# Patient Record
Sex: Male | Born: 1945 | Race: White | Hispanic: No | Marital: Married | Smoking: Former smoker
Health system: Southern US, Community
[De-identification: ages and names within clinical notes are randomized; demographics above are authoritative.]

## PROBLEM LIST (undated history)

## (undated) DIAGNOSIS — E039 Hypothyroidism, unspecified: Secondary | ICD-10-CM

## (undated) DIAGNOSIS — F319 Bipolar disorder, unspecified: Secondary | ICD-10-CM

## (undated) DIAGNOSIS — I639 Cerebral infarction, unspecified: Secondary | ICD-10-CM

## (undated) DIAGNOSIS — I10 Essential (primary) hypertension: Secondary | ICD-10-CM

## (undated) DIAGNOSIS — A159 Respiratory tuberculosis unspecified: Secondary | ICD-10-CM

## (undated) DIAGNOSIS — IMO0001 Reserved for inherently not codable concepts without codable children: Secondary | ICD-10-CM

## (undated) DIAGNOSIS — C801 Malignant (primary) neoplasm, unspecified: Secondary | ICD-10-CM

## (undated) DIAGNOSIS — F0150 Vascular dementia without behavioral disturbance: Secondary | ICD-10-CM

## (undated) DIAGNOSIS — E119 Type 2 diabetes mellitus without complications: Secondary | ICD-10-CM

## (undated) DIAGNOSIS — I519 Heart disease, unspecified: Secondary | ICD-10-CM

## (undated) DIAGNOSIS — I2510 Atherosclerotic heart disease of native coronary artery without angina pectoris: Secondary | ICD-10-CM

## (undated) HISTORY — PX: CORONARY ARTERY BYPASS GRAFT: SHX141

## (undated) HISTORY — DX: Type 2 diabetes mellitus without complications: E11.9

## (undated) HISTORY — DX: Atherosclerotic heart disease of native coronary artery without angina pectoris: I25.10

## (undated) HISTORY — DX: Bipolar disorder, unspecified: F31.9

## (undated) HISTORY — PX: APPENDECTOMY: SHX54

## (undated) HISTORY — DX: Vascular dementia, unspecified severity, without behavioral disturbance, psychotic disturbance, mood disturbance, and anxiety: F01.50

## (undated) HISTORY — PX: OTHER SURGICAL HISTORY: SHX169

## (undated) HISTORY — DX: Cerebral infarction, unspecified: I63.9

## (undated) HISTORY — DX: Heart disease, unspecified: I51.9

---

## 1996-09-16 DIAGNOSIS — I639 Cerebral infarction, unspecified: Secondary | ICD-10-CM

## 1996-09-16 HISTORY — DX: Cerebral infarction, unspecified: I63.9

## 2004-12-24 ENCOUNTER — Emergency Department: Payer: Self-pay | Admitting: Emergency Medicine

## 2005-01-01 ENCOUNTER — Emergency Department: Payer: Self-pay | Admitting: Emergency Medicine

## 2005-04-16 ENCOUNTER — Emergency Department: Payer: Self-pay | Admitting: Emergency Medicine

## 2005-06-19 ENCOUNTER — Emergency Department: Payer: Self-pay | Admitting: Emergency Medicine

## 2005-12-11 ENCOUNTER — Emergency Department: Payer: Self-pay | Admitting: Internal Medicine

## 2006-01-01 ENCOUNTER — Inpatient Hospital Stay: Payer: Self-pay | Admitting: Psychiatry

## 2006-01-06 ENCOUNTER — Other Ambulatory Visit: Payer: Self-pay

## 2006-05-10 ENCOUNTER — Emergency Department: Payer: Self-pay | Admitting: Emergency Medicine

## 2008-08-16 ENCOUNTER — Ambulatory Visit: Payer: Self-pay | Admitting: Oncology

## 2008-08-30 ENCOUNTER — Inpatient Hospital Stay: Payer: Self-pay | Admitting: Internal Medicine

## 2008-09-16 ENCOUNTER — Ambulatory Visit: Payer: Self-pay | Admitting: Oncology

## 2008-11-08 ENCOUNTER — Inpatient Hospital Stay: Payer: Self-pay | Admitting: Unknown Physician Specialty

## 2008-11-08 ENCOUNTER — Inpatient Hospital Stay: Payer: Self-pay | Admitting: Internal Medicine

## 2009-09-07 ENCOUNTER — Inpatient Hospital Stay: Payer: Self-pay | Admitting: Internal Medicine

## 2009-09-14 ENCOUNTER — Encounter: Payer: Self-pay | Admitting: Internal Medicine

## 2009-09-16 ENCOUNTER — Encounter: Payer: Self-pay | Admitting: Internal Medicine

## 2010-08-13 ENCOUNTER — Inpatient Hospital Stay: Payer: Self-pay | Admitting: Internal Medicine

## 2010-08-28 ENCOUNTER — Ambulatory Visit: Payer: Self-pay | Admitting: Ophthalmology

## 2010-10-30 ENCOUNTER — Ambulatory Visit: Payer: Self-pay | Admitting: Ophthalmology

## 2012-07-13 ENCOUNTER — Ambulatory Visit: Payer: Self-pay | Admitting: Family Medicine

## 2012-07-27 ENCOUNTER — Inpatient Hospital Stay: Payer: Self-pay | Admitting: Internal Medicine

## 2012-07-27 LAB — CBC WITH DIFFERENTIAL/PLATELET
Basophil %: 0.3 %
Eosinophil #: 0 10*3/uL (ref 0.0–0.7)
Eosinophil %: 0.1 %
HCT: 41.1 % (ref 40.0–52.0)
HGB: 14 g/dL (ref 13.0–18.0)
Lymphocyte #: 0.9 10*3/uL — ABNORMAL LOW (ref 1.0–3.6)
MCHC: 34.1 g/dL (ref 32.0–36.0)
MCV: 103 fL — ABNORMAL HIGH (ref 80–100)
Monocyte %: 13.5 %
Neutrophil #: 5.8 10*3/uL (ref 1.4–6.5)

## 2012-07-27 LAB — URINALYSIS, COMPLETE
Bilirubin,UR: NEGATIVE
Glucose,UR: NEGATIVE mg/dL (ref 0–75)
Ketone: NEGATIVE
Protein: NEGATIVE
RBC,UR: 3 /HPF (ref 0–5)
Specific Gravity: 1.004 (ref 1.003–1.030)
Squamous Epithelial: <1

## 2012-07-27 LAB — COMPREHENSIVE METABOLIC PANEL
Albumin: 2.9 g/dL — ABNORMAL LOW (ref 3.4–5.0)
Alkaline Phosphatase: 75 U/L (ref 50–136)
Anion Gap: 11 (ref 7–16)
BUN: 21 mg/dL — ABNORMAL HIGH (ref 7–18)
Calcium, Total: 8.5 mg/dL (ref 8.5–10.1)
Chloride: 110 mmol/L — ABNORMAL HIGH (ref 98–107)
Co2: 20 mmol/L — ABNORMAL LOW (ref 21–32)
Glucose: 76 mg/dL (ref 65–99)
Osmolality: 283 (ref 275–301)
Potassium: 4.3 mmol/L (ref 3.5–5.1)
SGOT(AST): 43 U/L — ABNORMAL HIGH (ref 15–37)
Sodium: 141 mmol/L (ref 136–145)
Total Protein: 7 g/dL (ref 6.4–8.2)

## 2012-07-27 LAB — VALPROIC ACID LEVEL: Valproic Acid: 61 ug/mL

## 2012-07-27 LAB — CK: CK, Total: 378 U/L — ABNORMAL HIGH (ref 35–232)

## 2012-07-27 LAB — TSH: Thyroid Stimulating Horm: 1.2 u[IU]/mL

## 2012-07-28 LAB — CK: CK, Total: 1065 U/L — ABNORMAL HIGH (ref 35–232)

## 2012-07-28 LAB — CK TOTAL AND CKMB (NOT AT ARMC)
CK, Total: 971 U/L — ABNORMAL HIGH (ref 35–232)
CK-MB: 10.7 ng/mL — ABNORMAL HIGH (ref 0.5–3.6)
CK-MB: 11.1 ng/mL — ABNORMAL HIGH (ref 0.5–3.6)

## 2012-07-28 LAB — CBC WITH DIFFERENTIAL/PLATELET
Basophil #: 0 10*3/uL (ref 0.0–0.1)
Basophil %: 0.3 %
Eosinophil #: 0 10*3/uL (ref 0.0–0.7)
HCT: 38.5 % — ABNORMAL LOW (ref 40.0–52.0)
HGB: 12.7 g/dL — ABNORMAL LOW (ref 13.0–18.0)
MCH: 33.9 pg (ref 26.0–34.0)
MCHC: 33 g/dL (ref 32.0–36.0)
MCV: 103 fL — ABNORMAL HIGH (ref 80–100)
Monocyte #: 1.1 x10 3/mm — ABNORMAL HIGH (ref 0.2–1.0)
Neutrophil %: 76.1 %
RDW: 13.5 % (ref 11.5–14.5)

## 2012-07-28 LAB — BASIC METABOLIC PANEL
Anion Gap: 9 (ref 7–16)
BUN: 17 mg/dL (ref 7–18)
Calcium, Total: 8.3 mg/dL — ABNORMAL LOW (ref 8.5–10.1)
Co2: 22 mmol/L (ref 21–32)
Creatinine: 1.4 mg/dL — ABNORMAL HIGH (ref 0.60–1.30)
EGFR (African American): >60
EGFR (Non-African Amer.): 52 — ABNORMAL LOW
Glucose: 84 mg/dL (ref 65–99)
Potassium: 4 mmol/L (ref 3.5–5.1)

## 2012-07-28 LAB — HEMOGLOBIN A1C: Hemoglobin A1C: 5.7 % (ref 4.2–6.3)

## 2012-07-28 LAB — LIPID PANEL
Cholesterol: 75 mg/dL (ref 0–200)
Ldl Cholesterol, Calc: 24 mg/dL (ref 0–100)
Triglycerides: 81 mg/dL (ref 0–200)
VLDL Cholesterol, Calc: 16 mg/dL (ref 5–40)

## 2012-07-28 LAB — MAGNESIUM: Magnesium: 1.4 mg/dL — ABNORMAL LOW

## 2012-07-28 LAB — TROPONIN I: Troponin-I: 0.25 ng/mL — ABNORMAL HIGH

## 2012-07-29 LAB — BASIC METABOLIC PANEL
Anion Gap: 8 (ref 7–16)
Co2: 21 mmol/L (ref 21–32)
Creatinine: 1.1 mg/dL (ref 0.60–1.30)
EGFR (Non-African Amer.): >60
Glucose: 82 mg/dL (ref 65–99)
Potassium: 3.6 mmol/L (ref 3.5–5.1)
Sodium: 148 mmol/L — ABNORMAL HIGH (ref 136–145)

## 2012-07-29 LAB — CBC WITH DIFFERENTIAL/PLATELET
Basophil #: 0 10*3/uL (ref 0.0–0.1)
Basophil %: 0.2 %
Eosinophil #: 0 10*3/uL (ref 0.0–0.7)
Eosinophil %: 0.1 %
HCT: 37.5 % — ABNORMAL LOW (ref 40.0–52.0)
Lymphocyte #: 1.4 10*3/uL (ref 1.0–3.6)
Lymphocyte %: 14.4 %
MCH: 35.5 pg — ABNORMAL HIGH (ref 26.0–34.0)
MCHC: 34.8 g/dL (ref 32.0–36.0)
MCV: 102 fL — ABNORMAL HIGH (ref 80–100)
Monocyte %: 8.1 %
Platelet: 111 10*3/uL — ABNORMAL LOW (ref 150–440)
RBC: 3.68 10*6/uL — ABNORMAL LOW (ref 4.40–5.90)
RDW: 14 % (ref 11.5–14.5)
WBC: 9.5 10*3/uL (ref 3.8–10.6)

## 2012-07-31 LAB — BASIC METABOLIC PANEL
BUN: 11 mg/dL (ref 7–18)
Chloride: 121 mmol/L — ABNORMAL HIGH (ref 98–107)
Co2: 22 mmol/L (ref 21–32)
Creatinine: 1.08 mg/dL (ref 0.60–1.30)
EGFR (African American): >60
EGFR (Non-African Amer.): >60
Osmolality: 297 (ref 275–301)

## 2012-08-02 LAB — CULTURE, BLOOD (SINGLE)

## 2012-08-02 LAB — BASIC METABOLIC PANEL
Calcium, Total: 8.8 mg/dL (ref 8.5–10.1)
Co2: 24 mmol/L (ref 21–32)
EGFR (African American): >60
EGFR (Non-African Amer.): >60
Osmolality: 287 (ref 275–301)
Potassium: 3.9 mmol/L (ref 3.5–5.1)
Sodium: 143 mmol/L (ref 136–145)

## 2012-08-02 LAB — PLATELET COUNT: Platelet: 162 10*3/uL (ref 150–440)

## 2012-08-02 LAB — EXPECTORATED SPUTUM ASSESSMENT W GRAM STAIN, RFLX TO RESP C

## 2013-04-28 ENCOUNTER — Ambulatory Visit: Payer: Self-pay | Admitting: Family

## 2013-09-16 DIAGNOSIS — A159 Respiratory tuberculosis unspecified: Secondary | ICD-10-CM

## 2013-09-16 HISTORY — DX: Respiratory tuberculosis unspecified: A15.9

## 2014-12-30 ENCOUNTER — Other Ambulatory Visit: Payer: Self-pay | Admitting: Family

## 2014-12-30 DIAGNOSIS — R911 Solitary pulmonary nodule: Secondary | ICD-10-CM

## 2015-01-03 NOTE — Consult Note (Signed)
PATIENT NAME:  Marcus [name], Marcus [name] MR#:  [id number] DATE OF BIRTH:  Dec 02, 1945  DATE OF CONSULTATION:  07/28/2012  REFERRING PHYSICIAN:  Dr. Bridgette Habermann CONSULTING PHYSICIAN:  Isaias Cowman, MD  PRIMARY CARE PHYSICIAN:  Dr. Derrek Monaco.  CHIEF COMPLAINT: I have bronchitis.   REASON FOR CONSULTATION: Consultation requested for evaluation of elevated troponin.   HISTORY OF PRESENT ILLNESS: The patient is a 69 year old gentleman with known history of coronary artery disease status post bypass graft surgery who was admitted with primary diagnosis of bronchitis. The patient has had a recent history of productive cough with greenish sputum for approximately two weeks with associated generalized weakness, fever and chills. The patient was admitted to telemetry where he had borderline elevated troponin of 0.25 without peak or trough, without associated chest pain. The patient has borderline elevated BUN and creatinine at 21 and 1.4, respectively. EKG shows sinus rhythm without acute ischemic ST-T wave changes.   PAST MEDICAL HISTORY:  1. Status post coronary artery bypass graft surgery x5 at [facility] in 1998.  2. Hypertension.  3. Hyperlipidemia.  4. Type 2 diabetes.  5. Prior history of cerebrovascular accident.  6. Bipolar disorder.  7. Tardive dyskinesia and chronic tremor.  8. History of cerebral aneurysm repair.   MEDICATIONS:  1. Aspirin 81 mg daily.  2. Atorvastatin 40 mg daily.  3. Metoprolol 50 mg daily.  4. Depakote 500 mg every morning, 250 mg at bedtime.  5. Levothyroxine 75 mcg daily.  6. Mucinex DM 1 tab q.12 p.r.n.  7. Olanzapine 30 mg at bedtime.   SOCIAL HISTORY: The patient currently lives with his wife and son. He smokes 1/2 pack of cigarettes a day.   FAMILY HISTORY: No immediate family history of coronary artery disease or myocardial infarction.   REVIEW OF SYSTEMS: CONSTITUTIONAL: The patient has had some fever and chills. EYES: No blurry  vision. EARS: No hearing loss. RESPIRATORY: The patient has a productive cough. CARDIOVASCULAR: No chest pain. GASTROINTESTINAL: The patient has some diarrhea. Denies nausea, vomiting, abdominal pain or constipation. GU: No dysuria or hematuria. ENDOCRINE: No polyuria or polydipsia. HEMATOLOGICAL: No easy bruising or bleeding. MUSCULOSKELETAL: No arthralgias or myalgias. NEUROLOGICAL: No focal muscle weakness or numbness. The patient has had a prior cerebrovascular accident, which was manifested with inability to ambulate. PSYCH: The patient has bipolar disorder.   PHYSICAL EXAMINATION:  VITAL SIGNS: Blood pressure 95/61, pulse 101, respirations 19, temperature 99.3, pulse oximetry 93%.   HEENT: Pupils equal and reactive to light and accommodation.   NECK: Supple without thyromegaly.   LUNGS: Decreased breath sounds in both bases with scattered expiratory wheezes.   CARDIOVASCULAR: Normal jugular venous pressure. Normal PMI. Tachycardia. Normal S1, S2. No appreciable gallop, murmur, or rub.   ABDOMEN: Soft, nontender.   EXTREMITIES: There was no cyanosis, clubbing or edema. Pulses were intact bilaterally.   MUSCULOSKELETAL: Normal muscle tone.   NEUROLOGIC: The patient is alert and oriented x3. Motor and sensory are both grossly intact.   IMPRESSION: This is a 69 year old gentleman with known coronary artery disease who presents with bronchitis with borderline elevated troponin in the absence of chest pain or ECG changes. This likely represents demand supply ischemia and not due to acute coronary syndrome.   RECOMMENDATIONS:  1. Agree with overall current therapy.  2. Defer full dose anticoagulation.  3. Treat underlying problem.  4. Consider 2-D echocardiogram to evaluate left ventricular function.  5. Further recommendations pending echocardiogram results.    ____________________________ Isaias Cowman, MD ap:ap  D: 07/28/2012 09:16:22 ET T: 07/28/2012 11:16:28  ET JOB#: [id number]  cc: Isaias Cowman, MD, <Dictator> Isaias Cowman MD ELECTRONICALLY SIGNED 08/01/2012 10:04

## 2015-01-03 NOTE — Discharge Summary (Signed)
PATIENT NAME:  Marcus [name], Marcus [name] MR#:  [id number] DATE OF BIRTH:  17-Sep-1945  DATE OF ADMISSION:  07/27/2012 DATE OF DISCHARGE:  08/04/2012  DISCHARGE DIAGNOSES:  1. Systemic antiinflammatory response syndrome likely due to pneumonia, improved.  2. Pneumonia, improved with antibiotics.  3. Elevated troponin, likely due to demand ischemia, no myocardial infarction, Echo showing normal ejection fraction.  4. Low magnesium, replaced and resolved. 5. Anemia, likely dilutional. 6. Hypernatremia, resolved with hydration. 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 Three negative AFBs ruling out acute tuberculosis.   SECONDARY DIAGNOSES: 1. Hypertension.  2. Hyperlipidemia. 3. Coronary artery disease, status post coronary artery bypass graft. 4. Type 2 diabetes. 5. History of cerebral aneurysm.  6. Bipolar disorder.  7. Tardive dyskinesia and chronic tremor.   CONSULTANTS:  1. Isaias Cowman, MD - Cardiology. 2. Lanae Boast, MD - Infectious Disease. 3. Physical Therapy.   RESULTS: CT scan of the head without contrast on 07/27/2002 showed no acute intracranial process. Chronic small vessel ischemic disease.   CT scan of the chest without contrast on 07/28/2012 showed patchy infiltrate at the left lower lobe and 1.3 cm left upper lobe nodule.   Chest x-ray on 07/27/2002 showed no acute cardiopulmonary disease.   Chest x-ray on 07/28/2012 showed findings consistent with chronic obstructive pulmonary disease and likely superimposed acute bronchitis. No focal pneumonia.  2-D echocardiogram on 07/28/2012 showed normal LV function, LVH, mild mitral and tricuspid regurgitation.   Urinalysis on admission: Trace bacteria, 36 WBCs, and 1+ leukocyte esterase.   Blood cultures x2 were negative on admission.   Urine culture was contaminated.   Acid-fast culture x3 of sputum were negative.   HIV antibodies were nonreactive, on  07/28/2012. QuantiFERON gold test was negative.   Sputum culture was within normal limits, on 07/29/2012.  Sputum culture on 07/31/2012 was also within normal limits.   HISTORY AND SHORT HOSPITAL COURSE: The patient is a 69 year old male with the above-mentioned medical problems who was admitted for SIRS thought to be secondary to bronchitis. Please see Dr. Lauris Chroman dictated history and physical for further details. Cardiology consultation was obtained with Dr. Saralyn Pilar considering borderline elevated troponin thought to be secondary to supply/demand ischemia. He recommended 2-D echocardiogram, which was performed with results dictated above, essentially within normal limits. Infectious disease consultation was obtained with Dr. Clayborn Bigness as there was a positive PPD with CT scan worrisome for possible tuberculosis. He recommended isolation, would continue Levaquin for treatment of possible lung infection, and three AFBs of sputum, which were all negative. He also had QuantiFERON gold test, which was negative. The patient was feeling much better with physical therapy involved in his inpatient therapy and was discharged home on 08/04/2012 in stable condition.   DISCHARGE PHYSICAL EXAMINATION:   VITAL SIGNS: Temperature was 98.5, heart rate 63 per minute, respirations 20 per minute, blood pressure 104/66 mmHg, and he was saturating 91% on room air.   CARDIOVASCULAR: S1, S2 normal. No murmurs, rubs or gallops.   LUNGS: Clear to auscultation bilaterally. No wheezing, rales, rhonchi, or crepitation.   ABDOMEN: Soft, benign.   NEUROLOGIC: Nonfocal examination. All other physical examination remained at baseline.   DISCHARGE MEDICATIONS:  1. Aspirin 81 mg p.o. daily.  2. Depakote 250 mg p.o. at bedtime.  3. Toprol-XL 50 mg p.o. daily.  4. Levothyroxine 75 mcg p.o. daily.  5. Vitamin D 1.25 mg p.o. once a month. 6. Olanzapine 15 mg p.o. two tablets at bedtime.  7. Atorvastatin 40 mg p.o. at  bedtime.  8. Mucinex DM one tablet p.o. twice a day as needed.  9. Depakote 250 mg two tablets p.o. daily.   DISCHARGE DIET: Low fat, low cholesterol.   DISCHARGE ACTIVITY: As tolerated.                   DISCHARGE INSTRUCTIONS AND FOLLOW-UP: The patient was instructed to follow-up with his primary care physician at the [facility] with Dr. Patsey Berthold. He will need follow-up with the health department here in [facility] in 1 to 2 weeks.   TOTAL TIME DISCHARGING THIS PATIENT: 55 minutes. ____________________________ Lucina Mellow. Manuella Ghazi, MD vss:slb D: 08/08/2012 13:23:45 ET T: 08/09/2012 14:29:58 ET JOB#: [id number]  cc: Calab Sachse S. Manuella Ghazi, MD, <Dictator> Jomarie Longs, DO Isaias Cowman, MD [place]field Blocker, MD [place] MD ELECTRONICALLY SIGNED 08/10/2012 14:06

## 2015-01-03 NOTE — Consult Note (Signed)
Impression: 69yo male w/ h/o DM and positive PPD admitted with LE weakness and possible pneumonia.  He denies any cough or SOB or sputum production, but his H&P indicates that he has had 2 weeks of these symptoms prior to admission.  He has been afebrile in house (although he has had some temps just over 100 early in his admission) and his WBC are normal.  His CXR does not show an infiltrate, but his CT of the chest showed a pulmonary nodule and patchy LLL infiltrate.  He appears to be improving on levofloxacin. He had a positive PPD at the health dept.  I do not know the details of how much of a reaction he had.  I agree with ruling out TB.  If his sputum are negative, I would not necessarily feel that he needs bronchoscopy from an ID point of view.  Will defer to pulmonary whether the bronch would help with diagnosis of the nodule. Continue levofloxacin.  Will change to po.  Will increase to pneumonia doses and plan for 5 days of therapy presuming his TB smears are negative. His AFB sputums should be back tomorrow.  If he does not have TB, he will still need to follow up with the HD for INH therapy for prior TB exposure.    Electronic Signatures: Lumina Gitto, Heinz Knuckles (MD) (Signed on 13-Nov-13 15:41)  Authored   Last Updated: 13-Nov-13 16:45 by Alyza Artiaga, Heinz Knuckles (MD)

## 2015-01-03 NOTE — Consult Note (Signed)
PATIENT NAME:  Marcus [name], Marcus [name] MR#:  [id number] DATE OF BIRTH:  07-04-1946  DATE OF CONSULTATION:  07/29/2012  REFERRING PHYSICIAN:  Dr. Bridgette Habermann  CONSULTING PHYSICIAN:  Heinz Knuckles. Zenith Lamphier, MD  REASON FOR CONSULTATION: Infiltrate on CT with positive PPD.   HISTORY OF PRESENT ILLNESS: Patient is a 69 year old man with a past medical history significant for diabetes and a recent positive PPD at the health department who was admitted on 11/11 with fall and a cough. Patient states that he had been having lower extremity weakness and falling for several days. He denied having any cough, sputum production or shortness of breath to me but the history and physical indicates that he had been having two weeks of cough with greenish sputum. He has not had any injuries related to his fall but was admitted due to worsening renal function and fever. At his admission he was started on levofloxacin. After he had been admitted information from the health department became available that demonstrated a positive PPD. A CT scan has been obtained and the patient was noted to have a left lower lobe infiltrate as well as a pulmonary nodule with some borderline lymph nodes. Patient was placed on isolation and AFB smears have been obtained. These are currently pending. He states he is feeling better on the Levaquin. He denies any fevers, chills or sweats. He has not had any nausea or vomiting. He does complain of some congestion at times.   ALLERGIES: None.   PAST MEDICAL HISTORY:  1. Diabetes. He is not currently on any medications for this.  2. Hypertension.  3. Hypercholesterolemia.  4. Coronary artery disease status post coronary artery bypass graft.  5. Cerebral aneurysm status post surgical repair.  6. Bipolar disorder.  7. Tardive dyskinesia and chronic tremor.   SOCIAL HISTORY: Patient lives with his wife and son. He smokes 1/2 pack of cigarettes per day. He does not drink. He has no injecting drug use  history.   FAMILY HISTORY: Positive for alcoholism.   REVIEW OF SYSTEMS: GENERAL: He denies any fevers, chills or sweats. He has had fatigue and generalized malaise and weakness. HEENT: Some nasal congestion. No sinus pain. No headaches. No sore throat. NECK: No stiffness. No swollen glands. RESPIRATORY: He denied any cough, shortness of breath or sputum production to me but apparently had said he had cough and greenish sputum production for a few weeks according to the history and physical. CARDIAC: No chest pains or palpitations. No peripheral edema. GASTROINTESTINAL: No nausea, no vomiting, no abdominal pain, no change in his bowels. GENITOURINARY: No change in his urine. NEUROLOGIC: No focal weakness but he has been having falls recently. SKIN: No rashes. PSYCHIATRIC: No complaints. All other systems are negative.   PHYSICAL EXAMINATION:  VITAL SIGNS: T-max of 100.6, T-current of 98.5, pulse 80, blood pressure 95/53, 97% on room air.   GENERAL: 69 year old white man in no acute distress.   HEENT: Normocephalic, atraumatic. Pupils equal, reactive to light. Extraocular motion intact. Sclerae, conjunctivae, and lids are without evidence for emboli or petechiae. Oropharynx shows no erythema or exudate. Teeth and gums are in fair condition.   NECK: Supple. Full range of motion. Midline trachea. No lymphadenopathy. No thyromegaly.   CHEST: Clear to auscultation bilaterally. Good air movement. No focal consolidation.   CARDIAC: Regular rate and rhythm without murmur, rub, or gallop.   ABDOMEN: Soft, nontender, nondistended. No hepatosplenomegaly. No hernia is noted.   EXTREMITIES: No evidence for tenosynovitis.   SKIN: No rashes.  No stigmata of endocarditis, specifically no Janeway lesions or Osler nodes.   NEUROLOGIC: The patient was awake and interactive. He was a relatively poor historian but was able to answer questions appropriately.   PSYCHIATRIC: Mood and affect appeared normal.    LABORATORY, DIAGNOSTIC AND RADIOLOGICAL DATA: BUN 12, creatinine 1.10, bicarbonate 21, anion gap 8. LFTs from admission had an AST of 43, ALT of 25, alkaline phosphatase 75, total bilirubin of 0.35. TSH was normal. White count from today was 9.5 with a hemoglobin 13.1, platelet count of 111, ANC of 7.3. White count on admission was 7.8. Blood cultures from admission show no growth. A urinalysis had negative nitrites, 1+ leukocyte esterase, 3 red cells and 36 white cells per high-powered field. Urine culture had mixed bacterial organisms. An HIV antibody was negative. A chest x-ray showed no acute cardiopulmonary disease. A CT scan of the head without contrast showed no acute process. A chest x-ray from yesterday showed findings consistent with chronic obstructive pulmonary disease but no focal pneumonia. A CT scan of the chest without contrast from yesterday demonstrated patchy infiltrates in the left lower lobe. There was a 1.2 cm pulmonary nodule in left upper lobe with indeterminate mediastinal lymph nodes present.   IMPRESSION: 69 year old man with a history of diabetes and positive PPD was admitted with lower extremity weakness and possible pneumonia.   RECOMMENDATIONS:  1. He denies any cough or shortness of breath or sputum production but his history and physical indicates that he was having two weeks of these symptoms prior to admission. He has been afebrile in-house (although he has had some temps just over 100 early in his admission) and his white count is normal. His chest x-ray does not show an infiltrate but a CT of his chest showed a pulmonary nodule and patchy left lower lobe infiltrate. He appears to be improving on levofloxacin.  2. He had a positive PPD at the health department. I do not know the details of how much of a reaction he had. I agree with ruling out tuberculosis. If his sputums are negative I would not necessarily feel that he needs a bronchoscopy from an ID point of view.  Will defer to pulmonary whether bronchoscopy would help with a diagnosis of the nodule.  3. Would continue levofloxacin. Will change to p.o. Will increase to pneumonia doses and plan for five days of therapy presuming his tuberculosis smears are negative.  4. His AFB sputum should be back tomorrow. If he does not have active TB he will still need to follow up with the health department for [place] therapy for prior tuberculosis exposure.   This is a moderately complex infectious disease case. Thank you very much for involving me in Mr. Gaspari's care.   ____________________________ Heinz Knuckles. Lakesha Levinson, MD meb:cms D: 07/29/2012 16:46:39 ET T: 07/29/2012 17:06:49 ET  JOB#: [id number] cc: Heinz Knuckles. Esaul Dorwart, MD, <Dictator>  Daimien E Octavio Matheney MD ELECTRONICALLY SIGNED 08/03/2012 12:20

## 2015-01-03 NOTE — H&P (Signed)
PATIENT NAME:  Marcus [name], Marcus [name] MR#:  [id number] DATE OF BIRTH:  [birth year]  DATE OF ADMISSION:  07/27/2012  REFERRING PHYSICIAN: Dr. Cinda Quest.   PRIMARY CARE PHYSICIAN: Dr. Derrek Monaco.   CHIEF COMPLAINT: Fall, cough.   HISTORY OF PRESENT ILLNESS: The patient is a 69 year old Caucasian male with history of bipolar disorder, history of stroke and cerebral aneurysm, diabetes, coronary artery disease status post coronary artery bypass graft who is not feeling well for several days. The patient has been having a productive cough with greenish sputum for two weeks. The last several days have been accompanied with weakness and the patient has had a fall on Sunday and on Friday on his behind. He did not sustain an injury from the falls. He feels overall unsteady and leaning to the right side and also has had fevers. He denies having any shortness of breath or chest pains. Here he had a CT of the head which was negative for stroke. He also does have a tremor in upper and lower extremities and he thinks that this is more pronounced than prior. Here he was noted to have renal failure, SIRS criteria and positive troponin and hospitalist services were contacted for further evaluation and management.   PAST MEDICAL HISTORY:  1. Hypertension. 2. Hyperlipidemia. 3. Coronary artery disease status post coronary artery bypass graft x5. 4. Type 2 diabetes but he is not on any medications.  5. History of cerebral aneurysm but was repaired. 6. Bipolar disorder. 7. Tardive dyskinesia and chronic tremor.   SOCIAL HISTORY: He smokes 1/2 pack a day. No alcohol or drug use. He lives with his wife and son.   ALLERGIES: No known drug allergies.   FAMILY HISTORY: Father was an alcoholic.   OUTPATIENT MEDICATIONS:  1. Aspirin 81 mg daily.  2. Atorvastatin 40 mg daily.  3. Depakote 500 mg extended-release in the morning, 250 mg at nighttime.  4. Levothyroxine 75 mcg daily.  5. Mucinex DM 1 tab every 12 hours as  needed for cough. 6. Olanzapine 30 mg at bedtime. 7. Toprol-XL 50 mg daily.  8. Vitamin D2 50,000 international units one cap once a month.   REVIEW OF SYSTEMS: CONSTITUTIONAL: Positive for fever, fatigue, and weakness, which is generalized. EYES: No blurry vision or double vision. ENT: No tinnitus or ear pain. RESPIRATORY: Positive for cough. He denies hemoptysis, shortness of breath or wheezing. CARDIOVASCULAR: No chest pain, orthopnea, or edema. Positive for hypertension and coronary artery bypass graft in the past. GASTROINTESTINAL: No nausea, vomiting, diarrhea, abdominal pain, or hematemesis. GENITOURINARY: Denies dysuria, but endorses polyuria and increased frequency. ENDOCRINE: No nocturia. HEME/LYMPH: No anemia or easy bruising. SKIN: No new rashes. MUSCULOSKELETAL: Denies arthritis or gout. NEUROLOGIC: Denies focal weakness or numbness. Positive for stroke and cerebral aneurysm. PSYCH: Bipolar.   PHYSICAL EXAMINATION:  VITAL SIGNS: Temperature 102.2, pulse 123, respiratory rate 22, blood pressure on arrival 128/71. Last heart rate of 114 and oxygen saturation 93% on room air.   GENERAL: The patient is a disheveled Caucasian male lying in bed, talking in full sentences.   HEENT: Normocephalic, atraumatic. Pupils are equal and reactive. Anicteric sclerae. Constant lip smacking while the patient is being interviewed with somewhat dry mucous membranes.   NECK: Supple. No thyroid tenderness. No JVD.   CARDIOVASCULAR: S1, S2, tachycardic. No murmurs appreciated.   LUNGS: Bilateral scattered wheezing. Good air entry. No crackles.   ABDOMEN: Soft, nontender, nondistended. Positive bowel sounds in all quadrants.   EXTREMITIES: No significant lower extremity edema.  SKIN: No obvious rashes.   NEUROLOGICAL: Cranial nerves II through XII grossly intact. Strength appears to be five out of five in all extremities. Very pronounced intentional tremor bilateral upper extremities more so than  lower extremities.   PSYCH: Awake, alert, oriented x3, cooperative.  LABORATORY, DIAGNOSTIC, AND RADIOLOGICAL DATA: BUN 21, creatinine 1.4; last creatinine was 1.24 in 08/2010. Glucose 76, chloride 110, serum CO2 20. LFTs showed albumin of 2.9 and AST 43, otherwise within normal limits. CK-MB is 378 and troponin 0.11. TSH 1.2. Valproic acid 61. WBC 7.8, hemoglobin 14, hematocrit 41.1, platelets 122. Urinalysis positive for 2+ blood, no nitrites, but 1+ leukocyte esterase and 36 WBC, trace bacteria. EKG: Undetermined rhythm, however, but probably this is sinus and I do see P waves in II and in V5. There are Q waves in inferior leads. I do not see any pronounced ST elevations or depressions. A CT of the head without contrast showing no acute intracranial process. Chronic small vessel ischemic disease. Small focus of air in the soft tissue along the right side of the face which is nonspecific. No evidence of mass effect, likely an old infarct in the right cerebellum. Chest, portable x-ray, one view, showing heart and mediastinum are stable. No focal pulmonary opacities.   ASSESSMENT AND PLAN: We have a 69 year old Caucasian male with history of bipolar disorder, hypertension, coronary artery disease status post coronary artery bypass graft and stroke in the past with tardive dyskinesia and tremors with positive SIRS criteria, weakness, and a fall and also noted to have positive troponin and renal failure. The patient does have sepsis per criteria as he has fever and tachycardia and has dirty urine as a source. The patient also has probable acute bronchitis given productive cough and wheezing noted on exam. The patient has received Levaquin x1. Blood and urine cultures have been ordered and pending. The patient also has some wheezing. Would continue the Levaquin. Follow with blood and urine cultures and I would also get a x-ray of the chest, PA and lateral, in the morning and obtain sputum cultures. I would  evaluate for developing an infiltrate per x-ray. The patient's fall likely is secondary to weakness and sepsis that he is experiencing. He does have pronounced tremor in the upper extremities more so than the lower extremities. I would obtain a PT consult and start the patient on some IV fluids. The patient's acute renal failure likely is secondary to sepsis, dehydration and poor p.o. intake he has been having. I would monitor the GFR and if does not improve, I would consider obtaining a renal ultrasound. He does have positive troponins which are likely demand ischemia in nature and from sepsis and less likely from NSTEMI, but also that it is possible as well. He does have history of coronary artery disease, hypertension, hyperlipidemia and he had a coronary artery bypass graft in the past. I would cycle the troponins and continue aspirin, statin, beta blocker and nitroglycerin that have been ordered. I would obtain an echocardiogram here and obtain a cardiology consult. I would defer on starting anticoagulation given the patient's lack of any chest pain symptoms. He does have elevation of CK total which likely is secondary to the falls he has had. I would continue his outpatient blood pressure medications. He does smoke tobacco and he was counseled for three minutes. I will start him on a nicotine patch. Start him on heparin for deep vein thrombosis prophylaxis.   CODE STATUS: FULL CODE.  TOTAL TIME SPENT:  60 minutes.   ____________________________ Vivien Presto, MD sa:ap D: 07/27/2012 22:14:02 ET T: 07/28/2012 07:05:32 ET JOB#: [id number]  cc: Vivien Presto, MD, <Dictator> Danelle Berry. Derrek Monaco, MD  Vivien Presto MD ELECTRONICALLY SIGNED 08/27/2012 11:04

## 2015-01-03 NOTE — Consult Note (Signed)
Brief Consult Note: Diagnosis: Borderline eleated troponin, probable demand/supply ischemia.   Patient was seen by consultant.   Consult note dictated.   Comments: REC  Agree with current therapy, defer full dose anticoagulation, review echo.  Electronic Signatures: Isaias Cowman (MD)  (Signed 12-Nov-13 09:17)  Authored: Brief Consult Note   Last Updated: 12-Nov-13 09:17 by Isaias Cowman (MD)

## 2015-04-03 ENCOUNTER — Ambulatory Visit
Admission: RE | Admit: 2015-04-03 | Discharge: 2015-04-03 | Disposition: A | Discharge status: Home / Self Care | Payer: Medicare (Managed Care) | Source: Ambulatory Visit | Attending: Family | Admitting: Family

## 2015-04-03 DIAGNOSIS — R911 Solitary pulmonary nodule: Secondary | ICD-10-CM | POA: Diagnosis present

## 2015-04-06 ENCOUNTER — Other Ambulatory Visit: Payer: Self-pay | Admitting: Cardiothoracic Surgery

## 2015-04-06 ENCOUNTER — Other Ambulatory Visit (HOSPITAL_COMMUNITY): Payer: Self-pay | Admitting: Family

## 2015-04-06 ENCOUNTER — Other Ambulatory Visit: Payer: Self-pay | Admitting: Family

## 2015-04-06 DIAGNOSIS — R918 Other nonspecific abnormal finding of lung field: Secondary | ICD-10-CM

## 2015-04-11 ENCOUNTER — Ambulatory Visit
Admission: RE | Admit: 2015-04-11 | Discharge: 2015-04-11 | Disposition: A | Discharge status: Home / Self Care | Payer: Medicare (Managed Care) | Source: Ambulatory Visit | Attending: Family | Admitting: Family

## 2015-04-11 ENCOUNTER — Encounter
Admission: RE | Admit: 2015-04-11 | Discharge: 2015-04-11 | Disposition: A | Discharge status: Home / Self Care | Payer: Medicare (Managed Care) | Source: Ambulatory Visit | Attending: Family | Admitting: Family

## 2015-04-11 DIAGNOSIS — R911 Solitary pulmonary nodule: Secondary | ICD-10-CM | POA: Diagnosis present

## 2015-04-11 DIAGNOSIS — R918 Other nonspecific abnormal finding of lung field: Secondary | ICD-10-CM

## 2015-04-11 DIAGNOSIS — J986 Disorders of diaphragm: Secondary | ICD-10-CM | POA: Insufficient documentation

## 2015-04-12 ENCOUNTER — Ambulatory Visit
Admission: RE | Admit: 2015-04-12 | Discharge: 2015-04-12 | Disposition: A | Discharge status: Home / Self Care | Payer: Medicare (Managed Care) | Source: Ambulatory Visit | Attending: Family | Admitting: Family

## 2015-04-12 DIAGNOSIS — J439 Emphysema, unspecified: Secondary | ICD-10-CM | POA: Diagnosis not present

## 2015-04-12 DIAGNOSIS — R911 Solitary pulmonary nodule: Secondary | ICD-10-CM | POA: Insufficient documentation

## 2015-04-12 DIAGNOSIS — R918 Other nonspecific abnormal finding of lung field: Secondary | ICD-10-CM | POA: Diagnosis present

## 2015-04-12 LAB — GLUCOSE, CAPILLARY: Glucose-Capillary: 130 mg/dL — ABNORMAL HIGH (ref 65–99)

## 2015-04-12 MED ORDER — FLUDEOXYGLUCOSE F - 18 (FDG) INJECTION
12.0100 | Freq: Once | INTRAVENOUS | Status: AC | PRN
Start: 2015-04-12 — End: 2015-04-12
  Administered 2015-04-12: 12.01 via INTRAVENOUS

## 2015-04-13 ENCOUNTER — Ambulatory Visit
Admission: RE | Admit: 2015-04-13 | Discharge: 2015-04-13 | Disposition: A | Discharge status: Home / Self Care | Payer: Medicare (Managed Care) | Source: Ambulatory Visit | Attending: Cardiothoracic Surgery | Admitting: Cardiothoracic Surgery

## 2015-04-13 ENCOUNTER — Inpatient Hospital Stay: Payer: Medicare (Managed Care) | Attending: Cardiothoracic Surgery | Admitting: Cardiothoracic Surgery

## 2015-04-13 ENCOUNTER — Encounter: Payer: Self-pay | Admitting: Cardiothoracic Surgery

## 2015-04-13 ENCOUNTER — Ambulatory Visit
Admission: RE | Admit: 2015-04-13 | Discharge: 2015-04-13 | Disposition: A | Discharge status: Home / Self Care | Payer: Medicare (Managed Care) | Source: Ambulatory Visit | Attending: *Deleted | Admitting: *Deleted

## 2015-04-13 ENCOUNTER — Other Ambulatory Visit: Payer: Self-pay | Admitting: Cardiothoracic Surgery

## 2015-04-13 VITALS — BP 106/68 | HR 87 | Temp 98.9°F | Resp 20 | Ht 68.5 in | Wt 128.4 lb

## 2015-04-13 DIAGNOSIS — C3412 Malignant neoplasm of upper lobe, left bronchus or lung: Secondary | ICD-10-CM

## 2015-04-13 DIAGNOSIS — R911 Solitary pulmonary nodule: Secondary | ICD-10-CM | POA: Diagnosis not present

## 2015-04-13 DIAGNOSIS — R918 Other nonspecific abnormal finding of lung field: Secondary | ICD-10-CM

## 2015-04-13 NOTE — Addendum Note (Signed)
Addended by: Sabino Gasser on: 04/13/2015 03:58 PM   Modules accepted: Orders

## 2015-04-13 NOTE — Progress Notes (Signed)
Patient ID: Marcus [name], male   DOB: Feb 23, 1946, 69 y.o.   MRN: [id number]  Chief Complaint  Patient presents with  . New Evaluation    lung mass; abn ct scan of chest    Referred By Dr. Baxley Desanctis Reason for Referral left upper lobe mass  HPI Location, Quality, Duration, Severity, Timing, Context, Modifying Factors, Associated Signs and Symptoms.  Marcus [name] is a 69 y.o. male.  This patient was seen today at the request of Dr. Rolena Infante for his left upper lobe mass. The patient has significant underlying psychiatric disorder and the history and recent past medical history are obtained from the wife and the son. According to them he had a lung nodule identified about 2 years ago which has been followed. A recent CT scan of the chest showed that the lung nodule it actually increased in size. The patient does continue to smoke a pack cigarettes vein is done so for 52 years. His other past medical history significant for coronary artery disease and is status post coronary artery bypass grafting. He states that he has not had any significant weight loss fevers chills. He has an occasional cough. Of note is that the patient also has some left back pain treated with a heating pad about 6 months ago. Since then the pain has dissipated. He uses a walker for ambulation and is quite limited in any of his activities of daily living. He's also had a prior stroke most of his limitations appeared to be secondary to his underlying bipolar disorder. He seen here today to discuss options for management of his left upper lobe nodule.   Past Medical History  Diagnosis Date  . Diabetes   . Vascular dementia   . Bipolar 1 disorder   . Stroke 1998  . Heart disease   . CAD (coronary artery disease)     Past Surgical History  Procedure Laterality Date  . Cardiac bypass      No family history on file.  Social History History  Substance Use Topics  . Smoking status: Current Every Day Smoker  -- 1.00 packs/day for 58 years    Types: Cigarettes  . Smokeless tobacco: Former Systems developer    Types: Snuff     Comment: used snuff in [employer]; uses cigarettes and cigars  . Alcohol Use: No    No Known Allergies  Current Outpatient Prescriptions  Medication Sig Dispense Refill  . acetaminophen (TYLENOL) 325 MG tablet Take 650 mg by mouth every 4 (four) hours as needed.    Marland Kitchen aspirin 81 MG tablet Take 81 mg by mouth daily.    Marland Kitchen atorvastatin (LIPITOR) 40 MG tablet Take 40 mg by mouth at bedtime.    . cholecalciferol (VITAMIN D) 1000 UNITS tablet Take 1,000 Units by mouth daily.    . divalproex (DEPAKOTE ER) 250 MG 24 hr tablet Take 500 mg by mouth at bedtime. Two tabs    . ENSURE PLUS (ENSURE PLUS) LIQD Take 237 mLs by mouth 3 (three) times daily between meals.     . gabapentin (NEURONTIN) 600 MG tablet Take 600 mg by mouth at bedtime.    Marland Kitchen levothyroxine (SYNTHROID, LEVOTHROID) 75 MCG tablet Take 75 mcg by mouth daily before breakfast.    . metoprolol succinate (TOPROL-XL) 50 MG 24 hr tablet Take 50 mg by mouth daily. Take with or immediately following a meal.    . OLANZapine (ZYPREXA) 20 MG tablet Take 20 mg by mouth at bedtime.  No current facility-administered medications for this visit.      Review of Systems A complete review of systems was asked and was negative except for the following positive findings easy bruising, shortness of breath, swelling of his legs, cough,.  Blood pressure 106/68, pulse 87, temperature 98.9 F (37.2 C), temperature source Tympanic, resp. rate 20, height 5' 8.5" (1.74 m), weight 128 lb 6.7 oz (58.25 kg), SpO2 96 %.  Physical Exam CONSTITUTIONAL:  Pleasant, well-developed, well-nourished, and in no acute distress. EYES: Pupils equal and reactive to light, Sclera non-icteric EARS, NOSE, MOUTH AND THROAT:  The oropharynx was clear.  Dentition is good repair.  Oral mucosa pink and moist. LYMPH NODES:  Lymph nodes in the neck and axillae were  normal RESPIRATORY:  Lungs were clear.  Normal respiratory effort without pathologic use of accessory muscles of respiration CARDIOVASCULAR: Heart was regular without murmurs.  There were no carotid bruits. GI: The abdomen was soft, nontender, and nondistended. There were no palpable masses. There was no hepatosplenomegaly. There were normal bowel sounds in all quadrants. GU:  Rectal deferred.   MUSCULOSKELETAL:  Normal muscle strength and tone.  No clubbing or cyanosis.   SKIN:  There were no pathologic skin lesions.  There were no nodules on palpation. NEUROLOGIC:  Sensation is normal.  Cranial nerves are grossly intact. PSYCH:  Very poor insight into his medical problems Mood and affect are normal.  Data Reviewed I have independently reviewed his PET scan and CT scans.  I have personally reviewed the patient's imaging, laboratory findings and medical records.    Assessment    The CT scan PET scan confirmed the presence of a dominant lesion in the left upper lobe most consistent with a primary lung cancer. In addition the PET scan shows uptake on the 11th rib. This area is not tender to palpation.    Plan    I do not believe that the patient is a surgical candidate. We will obtain some rib x-rays today and will set him up to see our oncologist. We can discuss the possibility of performing a biopsy if there is anything positive on the rib films.     Nestor Lewandowsky, MD 04/13/2015, 12:23 PM

## 2015-04-13 NOTE — Patient Instructions (Signed)
You Can Quit Smoking If you are ready to quit smoking or are thinking about it, congratulations! You have chosen to help yourself be healthier and live longer! There are lots of different ways to quit smoking. Nicotine gum, nicotine patches, a nicotine inhaler, or nicotine nasal spray can help with physical craving. Hypnosis, support groups, and medicines help break the habit of smoking. TIPS TO GET OFF AND STAY OFF CIGARETTES  Learn to predict your moods. Do not let a bad situation be your excuse to have a cigarette. Some situations in your life might tempt you to have a cigarette.  Ask friends and co-workers not to smoke around you.  Make your home smoke-free.  Never have "just one" cigarette. It leads to wanting another and another. Remind yourself of your decision to quit.  On a card, make a list of your reasons for not smoking. Read it at least the same number of times a day as you have a cigarette. Tell yourself everyday, "I do not want to smoke. I choose not to smoke."  Ask someone at home or work to help you with your plan to quit smoking.  Have something planned after you eat or have a cup of coffee. Take a walk or get other exercise to perk you up. This will help to keep you from overeating.  Try a relaxation exercise to calm you down and decrease your stress. Remember, you may be tense and nervous the first two weeks after you quit. This will pass.  Find new activities to keep your hands busy. Play with a pen, coin, or rubber band. Doodle or draw things on paper.  Brush your teeth right after eating. This will help cut down the craving for the taste of tobacco after meals. You can try mouthwash too.  Try gum, breath mints, or diet candy to keep something in your mouth. IF YOU SMOKE AND WANT TO QUIT:  Do not stock up on cigarettes. Never buy a carton. Wait until one pack is finished before you buy another.  Never carry cigarettes with you at work or at home.  Keep cigarettes  as far away from you as possible. Leave them with someone else.  Never carry matches or a lighter with you.  Ask yourself, "Do I need this cigarette or is this just a reflex?"  Bet with someone that you can quit. Put cigarette money in a piggy bank every morning. If you smoke, you give up the money. If you do not smoke, by the end of the week, you keep the money.  Keep trying. It takes 21 days to change a habit!  Talk to your doctor about using medicines to help you quit. These include nicotine replacement gum, lozenges, or skin patches. Document Released: 06/29/2009 Document Revised: 11/25/2011 Document Reviewed: 06/29/2009 Exit[employer] Patient Information 2015 [employer]. This information is not intended to replace advice given to you by your health care provider. Make sure you discuss any questions you have with your health care provider.  

## 2015-04-14 ENCOUNTER — Encounter: Payer: Self-pay | Admitting: *Deleted

## 2015-04-14 ENCOUNTER — Telehealth: Payer: Self-pay | Admitting: *Deleted

## 2015-04-14 NOTE — Progress Notes (Signed)
  Oncology Nurse Navigator Documentation    Navigator Encounter Type: Clinic/MDC (04/13/15 1440)               Met with patient at initial thoracic surgery appointment. Introduced Programmer, multimedia and reviewed plan of care. Will follow. Notified Marcus [name] of plan of care and will update as care progresses.

## 2015-04-14 NOTE — Telephone Encounter (Signed)
Wife had called Marcus Cage, FNP this morning. Called to confirm d/c asa prior to the biopsy.  Updated Marcus Cage, FNP on plan of care. A CT biopsy will be scheduled. Patient does need to d/c asa today until after bx is performed.

## 2015-04-14 NOTE — Telephone Encounter (Signed)
Notified wife that rib xray indicates a fracture and we would like to proceed with plan for CT guided biopsy of lung mass and medical oncology follow up after that. Verbalizes understanding.

## 2015-04-19 ENCOUNTER — Other Ambulatory Visit: Payer: Self-pay | Admitting: Physician Assistant

## 2015-04-20 ENCOUNTER — Ambulatory Visit
Admission: RE | Admit: 2015-04-20 | Discharge: 2015-04-20 | Disposition: A | Discharge status: Home / Self Care | Payer: Medicare (Managed Care) | Source: Ambulatory Visit | Attending: Interventional Radiology | Admitting: Interventional Radiology

## 2015-04-20 ENCOUNTER — Inpatient Hospital Stay
Admission: AD | Admit: 2015-04-20 | Discharge: 2015-04-23 | DRG: 181 | Disposition: A | Discharge status: Home / Self Care | Payer: Medicare (Managed Care) | Source: Ambulatory Visit | Attending: Internal Medicine | Admitting: Internal Medicine

## 2015-04-20 DIAGNOSIS — Z951 Presence of aortocoronary bypass graft: Secondary | ICD-10-CM

## 2015-04-20 DIAGNOSIS — I10 Essential (primary) hypertension: Secondary | ICD-10-CM | POA: Diagnosis present

## 2015-04-20 DIAGNOSIS — J95811 Postprocedural pneumothorax: Secondary | ICD-10-CM | POA: Diagnosis present

## 2015-04-20 DIAGNOSIS — C3412 Malignant neoplasm of upper lobe, left bronchus or lung: Principal | ICD-10-CM | POA: Insufficient documentation

## 2015-04-20 DIAGNOSIS — Z8673 Personal history of transient ischemic attack (TIA), and cerebral infarction without residual deficits: Secondary | ICD-10-CM | POA: Diagnosis not present

## 2015-04-20 DIAGNOSIS — F17210 Nicotine dependence, cigarettes, uncomplicated: Secondary | ICD-10-CM | POA: Diagnosis present

## 2015-04-20 DIAGNOSIS — Z79899 Other long term (current) drug therapy: Secondary | ICD-10-CM | POA: Diagnosis not present

## 2015-04-20 DIAGNOSIS — I2510 Atherosclerotic heart disease of native coronary artery without angina pectoris: Secondary | ICD-10-CM | POA: Diagnosis present

## 2015-04-20 DIAGNOSIS — E119 Type 2 diabetes mellitus without complications: Secondary | ICD-10-CM | POA: Diagnosis present

## 2015-04-20 DIAGNOSIS — Z7982 Long term (current) use of aspirin: Secondary | ICD-10-CM | POA: Diagnosis not present

## 2015-04-20 DIAGNOSIS — F319 Bipolar disorder, unspecified: Secondary | ICD-10-CM | POA: Diagnosis present

## 2015-04-20 DIAGNOSIS — Z820 Family history of epilepsy and other diseases of the nervous system: Secondary | ICD-10-CM

## 2015-04-20 DIAGNOSIS — Z9889 Other specified postprocedural states: Secondary | ICD-10-CM | POA: Insufficient documentation

## 2015-04-20 DIAGNOSIS — F0150 Vascular dementia without behavioral disturbance: Secondary | ICD-10-CM | POA: Diagnosis present

## 2015-04-20 DIAGNOSIS — E039 Hypothyroidism, unspecified: Secondary | ICD-10-CM | POA: Diagnosis present

## 2015-04-20 DIAGNOSIS — Z8611 Personal history of tuberculosis: Secondary | ICD-10-CM | POA: Diagnosis not present

## 2015-04-20 DIAGNOSIS — J449 Chronic obstructive pulmonary disease, unspecified: Secondary | ICD-10-CM | POA: Diagnosis present

## 2015-04-20 DIAGNOSIS — E785 Hyperlipidemia, unspecified: Secondary | ICD-10-CM | POA: Diagnosis present

## 2015-04-20 DIAGNOSIS — R918 Other nonspecific abnormal finding of lung field: Secondary | ICD-10-CM | POA: Diagnosis not present

## 2015-04-20 DIAGNOSIS — Z9049 Acquired absence of other specified parts of digestive tract: Secondary | ICD-10-CM | POA: Diagnosis present

## 2015-04-20 HISTORY — DX: Hypothyroidism, unspecified: E03.9

## 2015-04-20 HISTORY — DX: Essential (primary) hypertension: I10

## 2015-04-20 HISTORY — DX: Respiratory tuberculosis unspecified: A15.9

## 2015-04-20 HISTORY — DX: Reserved for inherently not codable concepts without codable children: IMO0001

## 2015-04-20 LAB — PROTIME-INR
INR: 0.91
Prothrombin Time: 12.5 seconds (ref 11.4–15.0)

## 2015-04-20 LAB — CBC
HCT: 46.5 % (ref 40.0–52.0)
Hemoglobin: 15.8 g/dL (ref 13.0–18.0)
MCH: 35.6 pg — ABNORMAL HIGH (ref 26.0–34.0)
MCHC: 34 g/dL (ref 32.0–36.0)
MCV: 104.6 fL — AB (ref 80.0–100.0)
Platelets: 218 10*3/uL (ref 150–440)
RBC: 4.44 MIL/uL (ref 4.40–5.90)
RDW: 13.2 % (ref 11.5–14.5)
WBC: 10.7 10*3/uL — AB (ref 3.8–10.6)

## 2015-04-20 LAB — CREATININE, SERUM
Creatinine, Ser: 0.97 mg/dL (ref 0.61–1.24)
GFR calc Af Amer: >60 mL/min (ref >60)
GFR calc non Af Amer: >60 mL/min (ref >60)

## 2015-04-20 LAB — APTT: aPTT: 26 seconds (ref 24–36)

## 2015-04-20 MED ORDER — SODIUM CHLORIDE 0.9 % IV SOLN
INTRAVENOUS | Status: DC
  Administered 2015-04-20: 08:00:00 via INTRAVENOUS

## 2015-04-20 MED ORDER — MIDAZOLAM HCL 5 MG/5ML IJ SOLN
INTRAMUSCULAR | Status: AC | PRN
Start: 2015-04-20 — End: 2015-04-20
  Administered 2015-04-20 (×2): 0.5 mg via INTRAVENOUS

## 2015-04-20 MED ORDER — ACETAMINOPHEN 325 MG PO TABS: 650.0000 mg | ORAL_TABLET | Freq: Four times a day (QID) | ORAL | Status: DC | PRN

## 2015-04-20 MED ORDER — MIDAZOLAM HCL 5 MG/5ML IJ SOLN
INTRAMUSCULAR | Status: AC | PRN
  Administered 2015-04-20: 0.5 mg via INTRAVENOUS

## 2015-04-20 MED ORDER — METOPROLOL SUCCINATE ER 50 MG PO TB24
50.0000 mg | ORAL_TABLET | Freq: Every day | ORAL | Status: DC
  Administered 2015-04-21 – 2015-04-23 (×3): 50 mg via ORAL
  Filled 2015-04-20 (×3): qty 1

## 2015-04-20 MED ORDER — ONDANSETRON HCL 4 MG PO TABS: 4.0000 mg | ORAL_TABLET | Freq: Four times a day (QID) | ORAL | Status: DC | PRN

## 2015-04-20 MED ORDER — FENTANYL CITRATE (PF) 100 MCG/2ML IJ SOLN
INTRAMUSCULAR | Status: AC | PRN
  Administered 2015-04-20: 25 ug via INTRAVENOUS

## 2015-04-20 MED ORDER — VITAMIN D 1000 UNITS PO TABS
1000.0000 [IU] | ORAL_TABLET | Freq: Every day | ORAL | Status: DC
  Administered 2015-04-21 – 2015-04-23 (×3): 1000 [IU] via ORAL
  Filled 2015-04-20 (×3): qty 1

## 2015-04-20 MED ORDER — GABAPENTIN 600 MG PO TABS
600.0000 mg | ORAL_TABLET | Freq: Every day | ORAL | Status: DC
Start: 2015-04-20 — End: 2015-04-23
  Administered 2015-04-20 – 2015-04-22 (×3): 600 mg via ORAL
  Filled 2015-04-20 (×4): qty 1

## 2015-04-20 MED ORDER — OLANZAPINE 10 MG PO TABS
20.0000 mg | ORAL_TABLET | Freq: Every day | ORAL | Status: DC
  Administered 2015-04-20 – 2015-04-22 (×3): 20 mg via ORAL
  Filled 2015-04-20 (×3): qty 2

## 2015-04-20 MED ORDER — DIVALPROEX SODIUM ER 500 MG PO TB24
500.0000 mg | ORAL_TABLET | Freq: Every day | ORAL | Status: DC
  Administered 2015-04-21 – 2015-04-22 (×2): 500 mg via ORAL
  Filled 2015-04-20 (×4): qty 1

## 2015-04-20 MED ORDER — HYDROCODONE-ACETAMINOPHEN 5-325 MG PO TABS
1.0000 | ORAL_TABLET | ORAL | Status: DC | PRN
  Administered 2015-04-20: 2 via ORAL
  Filled 2015-04-20: qty 2

## 2015-04-20 MED ORDER — PSYLLIUM 95 % PO PACK
1.0000 | PACK | Freq: Every day | ORAL | Status: DC
  Administered 2015-04-21 – 2015-04-23 (×3): 1 via ORAL
  Filled 2015-04-20 (×3): qty 1

## 2015-04-20 MED ORDER — ACETAMINOPHEN 650 MG RE SUPP: 650.0000 mg | Freq: Four times a day (QID) | RECTAL | Status: DC | PRN

## 2015-04-20 MED ORDER — LEVOTHYROXINE SODIUM 75 MCG PO TABS
75.0000 ug | ORAL_TABLET | Freq: Every day | ORAL | Status: DC
  Administered 2015-04-21 – 2015-04-23 (×3): 75 ug via ORAL
  Filled 2015-04-20 (×3): qty 1

## 2015-04-20 MED ORDER — ATORVASTATIN CALCIUM 20 MG PO TABS
40.0000 mg | ORAL_TABLET | Freq: Every day | ORAL | Status: DC
  Administered 2015-04-20 – 2015-04-22 (×3): 40 mg via ORAL
  Filled 2015-04-20 (×3): qty 2

## 2015-04-20 MED ORDER — FENTANYL CITRATE (PF) 100 MCG/2ML IJ SOLN
INTRAMUSCULAR | Status: AC | PRN
  Administered 2015-04-20 (×2): 25 ug via INTRAVENOUS

## 2015-04-20 MED ORDER — ONDANSETRON HCL 4 MG/2ML IJ SOLN: 4.0000 mg | Freq: Four times a day (QID) | INTRAMUSCULAR | Status: DC | PRN

## 2015-04-20 MED ORDER — ASPIRIN EC 81 MG PO TBEC
81.0000 mg | DELAYED_RELEASE_TABLET | Freq: Every day | ORAL | Status: DC
  Administered 2015-04-21 – 2015-04-23 (×3): 81 mg via ORAL
  Filled 2015-04-20 (×3): qty 1

## 2015-04-20 MED ORDER — ENOXAPARIN SODIUM 40 MG/0.4ML ~~LOC~~ SOLN
40.0000 mg | SUBCUTANEOUS | Status: DC
  Administered 2015-04-21 – 2015-04-22 (×2): 40 mg via SUBCUTANEOUS
  Filled 2015-04-20 (×2): qty 0.4

## 2015-04-20 NOTE — Consult Note (Signed)
Chief Complaint: I am getting a lung biopsy.     Referring Physician(s): Dr Nestor Lewandowsky  History of Present Illness: Marcus [name] is a 69 y.o. male with an imaging diagnosis of a LUL nodule, FDG-avid, and concerning for cancer.  He has been referred for percutaneous biopsy.      Past Medical History  Diagnosis Date  . Diabetes   . Vascular dementia   . Bipolar 1 disorder   . Stroke 1998  . Heart disease   . CAD (coronary artery disease)   . Hypertension   . Shortness of breath dyspnea   . Hypothyroidism   . Tuberculosis 2015    Past Surgical History  Procedure Laterality Date  . Cardiac bypass    . Coronary artery bypass graft    . Appendectomy      Allergies: Review of patient's allergies indicates no known allergies.  Medications: Prior to Admission medications   Medication Sig Start Date End Date Taking? Authorizing Provider  aspirin 81 MG tablet Take 81 mg by mouth daily.   Yes Historical Provider, MD  atorvastatin (LIPITOR) 40 MG tablet Take 40 mg by mouth at bedtime.   Yes Historical Provider, MD  cholecalciferol (VITAMIN D) 1000 UNITS tablet Take 1,000 Units by mouth daily.   Yes Historical Provider, MD  divalproex (DEPAKOTE ER) 250 MG 24 hr tablet Take 500 mg by mouth at bedtime. Two tabs   Yes Historical Provider, MD  ENSURE PLUS (ENSURE PLUS) LIQD Take 237 mLs by mouth 3 (three) times daily between meals.    Yes Historical Provider, MD  gabapentin (NEURONTIN) 600 MG tablet Take 600 mg by mouth at bedtime.   Yes Historical Provider, MD  levothyroxine (SYNTHROID, LEVOTHROID) 75 MCG tablet Take 75 mcg by mouth daily before breakfast.   Yes Historical Provider, MD  metoprolol succinate (TOPROL-XL) 50 MG 24 hr tablet Take 50 mg by mouth daily. Take with or immediately following a meal.   Yes Historical Provider, MD  OLANZapine (ZYPREXA) 20 MG tablet Take 20 mg by mouth at bedtime.   Yes Historical Provider, MD  psyllium (REGULOID) 0.52 G capsule Take  0.52 g by mouth daily.   Yes Historical Provider, MD  acetaminophen (TYLENOL) 325 MG tablet Take 650 mg by mouth every 4 (four) hours as needed.    Historical Provider, MD     History reviewed. No pertinent family history.  History   Social History  . Marital Status: Married    Spouse Name: N/A  . Number of Children: N/A  . Years of Education: N/A   Social History Main Topics  . Smoking status: Current Every Day Smoker -- 0.50 packs/day for 58 years    Types: Cigarettes  . Smokeless tobacco: Former Systems developer    Types: Snuff     Comment: used snuff in [employer]; uses cigarettes and cigars  . Alcohol Use: No  . Drug Use: No  . Sexual Activity: No   Other Topics Concern  . None   Social History Narrative       Review of Systems: A 12 point ROS discussed and pertinent positives are indicated in the HPI above.  All other systems are negative.  Review of Systems  Vital Signs: BP 106/75 mmHg  Pulse 102  Temp(Src) 98.2 F (36.8 C) (Oral)  Resp 22  Ht '5\' 11"'$  (1.803 m)  Wt 137 lb (62.143 kg)  BMI 19.12 kg/m2  SpO2 92%  Physical Exam   Atraumatic, normocephalic.  No  icterus or scleral injection. Mallampati is 2.  Full ROM of neck Clear to auscultation. RRR.  No third heart sounds.  Bowel sounds positive.  No guarding.  GU deferred.  No gross sensory or motor deficit.   Mallampati Score:  2  Imaging: Dg Ribs Unilateral Left  04/13/2015   CLINICAL DATA:  Left anterior rib pain. Lesion in the left eleventh rib on PET scan. Lung mass.  EXAM: LEFT RIBS - 2 VIEW  COMPARISON:  PET scan dated 04/12/2015 and CT scan of the chest dated 04/03/2015  FINDINGS: There is an old fracture posterior lateral aspect of the left eleventh rib but on PET scan the abnormal area is at the costochondral junction of the eleventh rib and not at the site of the fracture. On these radiographs there is no appreciable lesion at the costochondral junction. Irregular spiculated mass in the left upper  lobe is again noted. Emphysema. Scarring at the left lung base laterally. CABG.  IMPRESSION: No visible lesion at the costochondral junction the the lunate left eleventh rib. Old fracture of the left eleventh rib more posterior than the area of abnormality on PET-CT.  Emphysema.  Spiculated mass in the left upper lobe.   Electronically Signed   By: Lorriane Shire M.D.   On: 04/13/2015 14:13   Ct Chest Wo Contrast  04/03/2015   CLINICAL DATA:  History of left upper lobe pulmonary nodule.  EXAM: CT CHEST WITHOUT CONTRAST  TECHNIQUE: Multidetector CT imaging of the chest was performed following the standard protocol without IV contrast.  COMPARISON:  04/28/2013  FINDINGS: There is enlargement of a spiculated nodular mass in the lateral aspect of the left upper lobe. This nodule now measures 1.9 x 1.5 x 1.7 cm (previously 12 mm in greatest diameter). Enlargement is highly worrisome for malignancy. Recommend Thoracic Surgery consultation. PET scan should also be considered for staging evaluation.  There are some small/ borderline mediastinal lymph nodes which appears stable in size. A lymph node just anterior to the distal trachea and carina measures 12 mm in short axis. An AP window lymph node measures 8 mm in short axis. Hilar lymph node evaluation is limited without IV contrast. However, no obvious hilar masses are identified.  Lungs show evidence of mild emphysematous disease in both lungs. No additional nodules are identified. No evidence of edema, infiltrate, pneumothorax or pleural fluid.  The patient has had prior CABG. The heart size is normal. No pericardial abnormalities. Visualized upper abdominal structures are unremarkable. Mild degenerative changes are present in the lower thoracic spine. No focal bony lesions are identified.  IMPRESSION: Interval enlargement of left upper lobe nodule. The spiculated nodule has more than doubled in volume and measures 1.9 cm in greatest diameter. This is highly  worrisome for malignancy. Some associated small/borderline mediastinal lymph nodes appears stable since the prior CT. Recommend Thoracic Surgical consultation and consideration of PET scan for staging evaluation.  These results will be called to the ordering clinician or representative by the Radiologist Assistant, and communication documented in the PACS or zVision Dashboard.   Electronically Signed   By: Aletta Edouard M.D.   On: 04/03/2015 11:23   Dg Sniff Test  04/11/2015   CLINICAL DATA:  69 year old male with pulmonary nodules. Elevated left hemidiaphragm. Evaluate motion. Subsequent encounter.  EXAM: CHEST FLUOROSCOPY  TECHNIQUE: Real-time fluoroscopic evaluation of the chest was performed.  FLUOROSCOPY TIME:  82.82 micro Gy cm2  COMPARISON:  04/03/2015 chest CT.  FINDINGS: Elevated left hemidiaphragm. With inspiration  and expiration, the left hemidiaphragm does not move as well as the right hemidiaphragm however, the direction of motion is normal without paradoxical motion detected.  IMPRESSION: Elevated left hemidiaphragm. With inspiration and expiration, the left hemidiaphragm does not move as well as the right hemidiaphragm however, the direction of motion is normal without paradoxical motion detected.   Electronically Signed   By: Genia Del M.D.   On: 04/11/2015 09:27   Nm Pet Image Initial (pi) Skull Base To Thigh  04/12/2015   CLINICAL DATA:  Initial treatment strategy for left upper lobe pulmonary nodule.  EXAM: NUCLEAR MEDICINE PET SKULL BASE TO THIGH  TECHNIQUE: 12.0 mCi F-18 FDG was injected intravenously. Full-ring PET imaging was performed from the skull base to thigh after the radiotracer. CT data was obtained and used for attenuation correction and anatomic localization.  FASTING BLOOD GLUCOSE:  Value: 130 mg/dl  COMPARISON:  CT scan from 04/03/2015.  FINDINGS: NECK  No hypermetabolic lymph nodes in the neck.  CHEST  The 1.9 x 1.5 x 1.7 cm spiculated nodule seen recently in the  peripheral aspect of the left upper lobe is markedly hypermetabolic with SUV max = 42.6.  There is new patchy airspace disease in the posterior aspect of the inferior left lower lobe. This shows low level FDG uptake on PET images. It is new in the short interval since the previous diagnostic CT and felt to most likely be related to an infectious/inflammatory process of the left lower lobe. SUV max = 3.3.  ABDOMEN/PELVIS  No abnormal hypermetabolic activity within the liver, pancreas, adrenal glands, or spleen. No hypermetabolic lymph nodes in the abdomen or pelvis.  Tiny vascular calcifications seen in the hilum of each kidney. Low-attenuation lesions in both kidneys show no hypermetabolism on PET images, compatible with cysts. Atherosclerotic calcification is seen in the wall of the abdominal aorta without aneurysm.  SKELETON  Small hypermetabolic focus is seen in the lateral aspect of the left eleventh rib. This is not well seen on the current study and was not included on the previous chest CT. On image 162 of series 3 today, with appropriate window/ level settings, there appears to be a linear lucency in the rib at this level. There is no evidence for an expansile, sclerotic, or overtly lytic lesion at this location. As such, the uptake in this rib may well be related to fracture ( SUV max = 3.2). No other hypermetabolic bone lesions are evident.  IMPRESSION: 1. Left upper lobe pulmonary nodule is markedly hypermetabolic, consistent with primary bronchogenic neoplasm. 2. Interval development of patchy airspace disease in the posterior left lower lobe shows low level FDG accumulation. Features are most suggestive of an infectious or inflammatory process. 3. Focal uptake in the lateral aspect of the eleventh rib is felt to be most likely related to fracture as no discrete underlying bone lesion is evident, bud an early metastatic deposit cannot be entirely excluded. The area is not well demonstrated on today's non  breath hold CT exam and this rib was not included on the recent diagnostic CT chest. Rib detail films may be able to demonstrate a fracture at this location.   Electronically Signed   By: Misty Stanley M.D.   On: 04/12/2015 10:11    Labs:  CBC:  Recent Labs  04/20/15 0718  WBC 10.7*  HGB 15.8  HCT 46.5  PLT 218    COAGS:  Recent Labs  04/20/15 0718  INR 0.91  APTT 26  BMP: No results for input(s): NA, K, CL, CO2, GLUCOSE, BUN, CALCIUM, CREATININE, GFRNONAA, GFRAA in the last 8760 hours.  Invalid input(s): CMP  LIVER FUNCTION TESTS: No results for input(s): BILITOT, AST, ALT, ALKPHOS, PROT, ALBUMIN in the last 8760 hours.  TUMOR MARKERS: No results for input(s): AFPTM, CEA, CA199, CHROMGRNA in the last 8760 hours.  Assessment and Plan:  Mr Guevara is a 69 year old with a LUL nodule, concerning for primary CA.  He will undergo CT biopsy.   Risks and Benefits discussed with the patient including, but not limited to bleeding, hemoptysis, respiratory failure requiring intubation, infection, pneumothorax requiring chest tube placement, stroke from air embolism or even death. All of the patient's questions were answered, patient is agreeable to proceed. Consent signed and in chart.    Thank you for this interesting consult.  I greatly enjoyed meeting LANDER ESLICK and look forward to participating in their care.  A copy of this report was sent to the requesting provider on this date.  SignedCorrie Mckusick 04/20/2015, 9:39 AM   I spent a total of  15 Minutes   in face to face in clinical consultation, greater than 50% of which was counseling/coordinating care for lung biopys, with LUL nodule as target.

## 2015-04-20 NOTE — Procedures (Signed)
Interventional Radiology Procedure Note  Procedure: CT guided LUL nodule biopsy.  3 x 18 core.  BioSentry device deployed.  Complications: None Recommendations:  - Ok to shower tomorrow - CXR in 2 hours.  - observe NPO for 3 hours.   Signed,  Dulcy Fanny. Earleen Newport, DO

## 2015-04-20 NOTE — H&P (Signed)
[place] [facility] [place] NAME: Marcus [name]    MR#:  [id number]  DATE OF BIRTH:  [birth year]  DATE OF ADMISSION:  04/20/2015  PRIMARY CARE PHYSICIAN: [name], MD   REQUESTING/REFERRING PHYSICIAN: Dr. Liana Crocker  CHIEF COMPLAINT:  No chief complaint on file.  Pneumothorax postbiopsy  HISTORY OF PRESENT ILLNESS:  Marcus [name]  is a 69 y.o. male with a known history of diabetes, vascular dementia, bipolar disorder, history of coronary disease status post bypass, hypertension, who presented to the hospital due to an elective CT-guided biopsy for a left upper lobe lung mass. Patient underwent biopsy and is post biopsy chest x-ray showed a left-sided pneumothorax. Patient is now status post chest tube placement and is clinically doing fine. Hospitalist services were contacted for further treatment and evaluation. Patient presently denies any chest pain, worsening shortness of breath, nausea, vomiting, fever, chills and any other associated symptoms presently. He has some mild pain near Chest tube site.  PAST MEDICAL HISTORY:   Past Medical History  Diagnosis Date  . Diabetes   . Vascular dementia   . Bipolar 1 disorder   . Stroke 1998  . Heart disease   . CAD (coronary artery disease)   . Hypertension   . Shortness of breath dyspnea   . Hypothyroidism   . Tuberculosis 2015    PAST SURGICAL HISTORY:   Past Surgical History  Procedure Laterality Date  . Cardiac bypass    . Coronary artery bypass graft    . Appendectomy      SOCIAL HISTORY:   History  Substance Use Topics  . Smoking status: Current Every Day Smoker -- 0.50 packs/day for 58 years    Types: Cigarettes  . Smokeless tobacco: Former Systems developer    Types: Snuff     Comment: used snuff in [employer]; uses cigarettes and cigars  . Alcohol Use: No    FAMILY HISTORY:  History reviewed. No pertinent family history.  DRUG ALLERGIES:  No Known  Allergies  REVIEW OF SYSTEMS:   Review of Systems  Constitutional: Negative for fever and weight loss.  HENT: Negative for congestion, nosebleeds and tinnitus.   Eyes: Negative for blurred vision, double vision and redness.  Respiratory: Negative for cough, hemoptysis and shortness of breath.   Cardiovascular: Positive for chest pain (near chest tube site). Negative for orthopnea, leg swelling and PND.  Gastrointestinal: Negative for nausea, vomiting, abdominal pain, diarrhea and melena.  Genitourinary: Negative for dysuria, urgency and hematuria.  Musculoskeletal: Negative for joint pain and falls.  Neurological: Negative for dizziness, tingling, sensory change, focal weakness, seizures, weakness and headaches.  Endo/Heme/Allergies: Negative for polydipsia. Does not bruise/bleed easily.  Psychiatric/Behavioral: Negative for depression and memory loss. The patient is not nervous/anxious.     MEDICATIONS AT HOME:   Prior to Admission medications   Medication Sig Start Date End Date Taking? Authorizing Provider  aspirin 81 MG tablet Take 81 mg by mouth daily.   Yes Historical Provider, MD  atorvastatin (LIPITOR) 40 MG tablet Take 40 mg by mouth at bedtime.   Yes Historical Provider, MD  cholecalciferol (VITAMIN D) 1000 UNITS tablet Take 1,000 Units by mouth daily.   Yes Historical Provider, MD  divalproex (DEPAKOTE ER) 250 MG 24 hr tablet Take 500 mg by mouth at bedtime. Two tabs   Yes Historical Provider, MD  ENSURE PLUS (ENSURE PLUS) LIQD Take 237 mLs by mouth 3 (three) times daily between meals.    Yes  Historical Provider, MD  gabapentin (NEURONTIN) 600 MG tablet Take 600 mg by mouth at bedtime.   Yes Historical Provider, MD  levothyroxine (SYNTHROID, LEVOTHROID) 75 MCG tablet Take 75 mcg by mouth daily before breakfast.   Yes Historical Provider, MD  metoprolol succinate (TOPROL-XL) 50 MG 24 hr tablet Take 50 mg by mouth daily. Take with or immediately following a meal.   Yes  Historical Provider, MD  OLANZapine (ZYPREXA) 20 MG tablet Take 20 mg by mouth at bedtime.   Yes Historical Provider, MD  psyllium (REGULOID) 0.52 G capsule Take 0.52 g by mouth daily.   Yes Historical Provider, MD  acetaminophen (TYLENOL) 325 MG tablet Take 650 mg by mouth every 4 (four) hours as needed.    Historical Provider, MD      VITAL SIGNS:  Blood pressure 166/98, pulse 90, temperature 98.2 F (36.8 C), temperature source Oral, resp. rate 23, height '5\' 11"'$  (1.803 m), weight 62.143 kg (137 lb), SpO2 99 %.  PHYSICAL EXAMINATION:  Physical Exam  GENERAL:  69 y.o.-year-old cachectic patient lying in the bed with no acute distress.  EYES: Pupils equal, round, reactive to light and accommodation. No scleral icterus. Extraocular muscles intact.  HEENT: Head atraumatic, normocephalic. Oropharynx and nasopharynx clear. No oropharyngeal erythema, moist oral mucosa  NECK:  Supple, no jugular venous distention. No thyroid enlargement, no tenderness.  LUNGS: Prolonged inspiratory and expiratory phase, no wheezing, rales, rhonchi. No use of accessory muscles of respiration. Left-sided chest tube in place CARDIOVASCULAR: S1, S2 RRR. No murmurs, rubs, gallops, clicks.  ABDOMEN: Soft, nontender, nondistended. Bowel sounds present. No organomegaly or mass.  EXTREMITIES: No pedal edema, cyanosis, or clubbing. + 2 pedal & radial pulses b/l.   NEUROLOGIC: Cranial nerves II through XII are intact. No focal Motor or sensory deficits appreciated b/l PSYCHIATRIC: The patient is alert and oriented x 3. Good affect.  SKIN: No obvious rash, lesion, or ulcer.   LABORATORY PANEL:   CBC  Recent Labs Lab 04/20/15 0718  WBC 10.7*  HGB 15.8  HCT 46.5  PLT 218   ------------------------------------------------------------------------------------------------------------------  Chemistries  No results for input(s): NA, K, CL, CO2, GLUCOSE, BUN, CREATININE, CALCIUM, MG, AST, ALT, ALKPHOS, BILITOT in the  last 168 hours.  Invalid input(s): GFRCGP ------------------------------------------------------------------------------------------------------------------  Cardiac Enzymes No results for input(s): TROPONINI in the last 168 hours. ------------------------------------------------------------------------------------------------------------------  RADIOLOGY:  Dg Chest 1 View  04/20/2015   ADDENDUM REPORT: 04/20/2015 13:44  ADDENDUM: The findings were also discussed by me with Dr. Earleen Newport via Johny Shock.   Electronically Signed   By: David  Martinique M.D.   On: 04/20/2015 13:44   04/20/2015   CLINICAL DATA:  Status post left-sided lung biopsy foreign upper lobe nodule  EXAM: CHEST  1 VIEW  COMPARISON:  CT scan of today's date from the biopsy.  FINDINGS: There is an approximately 40% left-sided pneumothorax. There is no significant pleural effusion. There is patchy density in the left perihilar region. There is no mediastinal shift. The right lung is well-expanded and clear. There are post CABG changes.  IMPRESSION: There is an approximately 40% left pneumothorax without mediastinal shift.  This report was called to [facility] by Jearld Shines, RT, and the report given to [place], RN and who will give the report to Dr. Earleen Newport.  Electronically Signed: By: David  Martinique M.D. On: 04/20/2015 13:38   Ct Biopsy  04/20/2015   CLINICAL DATA:  69 year old male with a history of left upper lobe nodule.  He has been referred  for nodule biopsy.  EXAM: CT-GUIDED BIOPSY LEFT UPPER LOBE LUNG NODULE BIOPSY  MEDICATIONS AND MEDICAL HISTORY: Versed 0.5 mg, Fentanyl 50 mcg.  Additional Medications: None.  ANESTHESIA/SEDATION: Moderate sedation time: 25 minutes  PROCEDURE: The procedure, risks, benefits, and alternatives were explained to the patient. Specific risks discussed include bleeding, infection, pneumothorax, need for further procedure/treatment, air embolus, cardiopulmonary collapse, death. Questions regarding the  procedure were encouraged and answered. The patient understands and consents to the procedure.  Scout CT of the chest was performed for planning purposes.  The left upper chest was prepped with chlorhexidine in a sterile fashion, and a sterile drape was applied covering the operative field. A sterile gown and sterile gloves were used for the procedure.  Once the patient is prepped and draped in the usual sterile fashion, the skin and subcutaneous tissues were generously infiltrated with 1% lidocaine for local anesthesia. A small stab incision was made with an 11 blade scalpel.  Under CT guidance, a(n) 17 gauge guide needle was advanced into the left upper lobe nodule. Three separate 18 gauge core biopsy were then obtained. At this time a BioSentry device was deployed.  Final CT image was acquired.  Patient tolerated the procedure well and remained hemodynamically stable throughout.  No complications were encountered and no significant blood loss was encountered.  FINDINGS: CT imaging demonstrates target lesion within the left upper lobe.  Final CT image demonstrates expected pulmonary hemorrhage adjacent to the lesion, with no evidence of pneumothorax.  COMPLICATIONS: None  IMPRESSION: Status post left upper lobe nodule biopsy, with tissue specimen sent to pathology for complete histopathologic analysis.  Signed,  Dulcy Fanny. Earleen Newport, DO  [facility]  [facility]   Electronically Signed   By: Corrie Mckusick D.O.   On: 04/20/2015 11:32     IMPRESSION AND PLAN:   69 year old male with past medical history of hypertension, COPD, history of coronary disease status post bypass, possible dementia, bipolar disorder, who presented to the hospital for an elective lung biopsy for a left upper lobe lung mass and patient post biopsy was noted to have a left-sided pneumothorax.  #1 left sided pneumothorax-this is likely result of the patient's biopsy. -Patient is status post  chest tube placement and is clinically doing well. -I will get CT surgery consult with Dr. Genevive Bi to manage the chest tube. -Continue supportive care.  #2 hypertension-hemodynamically stable. Continue metoprolol.  #3 history of bipolar disorder-continue Depakote.  #4 hypothyroidism-continue Synthroid.  #5 hyperlipidemia-continue atorvastatin.    All the records are reviewed and case discussed with ED provider. Management plans discussed with the patient, family and they are in agreement.  CODE STATUS: Full  TOTAL TIME TAKING CARE OF THIS PATIENT: 50 minutes.    Henreitta Leber M.D on 04/20/2015 at 4:50 PM  Between 7am to 6pm - Pager - [phone]  After 6pm go to [email or web address] - password [email or web address] [facility]  [facility]  Office  [phone]  CC: Primary care physician; [name], MD

## 2015-04-21 ENCOUNTER — Inpatient Hospital Stay: Payer: Medicare (Managed Care)

## 2015-04-21 DIAGNOSIS — R918 Other nonspecific abnormal finding of lung field: Secondary | ICD-10-CM

## 2015-04-21 DIAGNOSIS — C3412 Malignant neoplasm of upper lobe, left bronchus or lung: Secondary | ICD-10-CM | POA: Insufficient documentation

## 2015-04-21 DIAGNOSIS — Z9889 Other specified postprocedural states: Secondary | ICD-10-CM | POA: Insufficient documentation

## 2015-04-21 LAB — CBC
HCT: 42.5 % (ref 40.0–52.0)
HEMOGLOBIN: 14.1 g/dL (ref 13.0–18.0)
MCH: 35.3 pg — ABNORMAL HIGH (ref 26.0–34.0)
MCHC: 33.3 g/dL (ref 32.0–36.0)
MCV: 106.2 fL — ABNORMAL HIGH (ref 80.0–100.0)
Platelets: 180 10*3/uL (ref 150–440)
RBC: 4 MIL/uL — ABNORMAL LOW (ref 4.40–5.90)
RDW: 13.5 % (ref 11.5–14.5)
WBC: 7.2 10*3/uL (ref 3.8–10.6)

## 2015-04-21 LAB — BASIC METABOLIC PANEL
Anion gap: 3 — ABNORMAL LOW (ref 5–15)
BUN: 23 mg/dL — AB (ref 6–20)
CALCIUM: 9 mg/dL (ref 8.9–10.3)
CO2: 31 mmol/L (ref 22–32)
Chloride: 112 mmol/L — ABNORMAL HIGH (ref 101–111)
Creatinine, Ser: 1.06 mg/dL (ref 0.61–1.24)
GFR calc non Af Amer: >60 mL/min (ref >60)
Glucose, Bld: 116 mg/dL — ABNORMAL HIGH (ref 65–99)
Potassium: 4.4 mmol/L (ref 3.5–5.1)
Sodium: 146 mmol/L — ABNORMAL HIGH (ref 135–145)

## 2015-04-21 NOTE — Progress Notes (Signed)
[facility] [place] NAME: Marcus [name]    MR#:  [id number]  DATE OF BIRTH:  23-Jun-1946  SUBJECTIVE:  CHIEF COMPLAINT:  No chief complaint on file.  left-sided chest pain due to chest tube.Marland Kitchen  REVIEW OF SYSTEMS:  CONSTITUTIONAL: No fever, fatigue or weakness.  EYES: No blurred or double vision.  EARS, NOSE, AND THROAT: No tinnitus or ear pain.  RESPIRATORY: No cough, shortness of breath, wheezing or hemoptysis.  CARDIOVASCULAR: Left side chest pain, no orthopnea, edema.  GASTROINTESTINAL: No nausea, vomiting, diarrhea or abdominal pain.  GENITOURINARY: No dysuria, hematuria.  ENDOCRINE: No polyuria, nocturia,  HEMATOLOGY: No anemia, easy bruising or bleeding SKIN: No rash or lesion. MUSCULOSKELETAL: No joint pain or arthritis.   NEUROLOGIC: No tingling, numbness, weakness.  PSYCHIATRY: No anxiety or depression.   DRUG ALLERGIES:  No Known Allergies  VITALS:  Blood pressure 118/74, pulse 57, temperature 97.7 F (36.5 C), temperature source Oral, resp. rate 19, height '5\' 11"'$  (1.803 m), weight 62.143 kg (137 lb), SpO2 99 %.  PHYSICAL EXAMINATION:  GENERAL:  69 y.o.-year-old patient lying in the bed with no acute distress.  EYES: Pupils equal, round, reactive to light and accommodation. No scleral icterus. Extraocular muscles intact.  HEENT: Head atraumatic, normocephalic. Oropharynx and nasopharynx clear.  NECK:  Supple, no jugular venous distention. No thyroid enlargement, no tenderness.  LUNGS: Normal breath sounds on the right side but weak on the right side, no wheezing, rales,rhonchi or crepitation. No use of accessory muscles of respiration.  CARDIOVASCULAR: S1, S2 normal. No murmurs, rubs, or gallops.  ABDOMEN: Soft, nontender, nondistended. Bowel sounds present. No organomegaly or mass.  EXTREMITIES: No pedal edema, cyanosis, or clubbing.  NEUROLOGIC: Cranial nerves II through XII are intact. Muscle strength 5/5 in  all extremities. Sensation intact. Gait not checked.  PSYCHIATRIC: The patient is alert and oriented x 3.  SKIN: No obvious rash, lesion, or ulcer.    LABORATORY PANEL:   CBC  Recent Labs Lab 04/21/15 0503  WBC 7.2  HGB 14.1  HCT 42.5  PLT 180   ------------------------------------------------------------------------------------------------------------------  Chemistries   Recent Labs Lab 04/21/15 0503  NA 146*  K 4.4  CL 112*  CO2 31  GLUCOSE 116*  BUN 23*  CREATININE 1.06  CALCIUM 9.0   ------------------------------------------------------------------------------------------------------------------  Cardiac Enzymes No results for input(s): TROPONINI in the last 168 hours. ------------------------------------------------------------------------------------------------------------------  RADIOLOGY:  Dg Chest 1 View  04/21/2015   CLINICAL DATA:  Left chest tube .  EXAM: CHEST  1 VIEW  COMPARISON:  04/20/2015 .  FINDINGS: Interim placement of left chest tube. No pneumothorax. Mediastinum and hilar structures are normal. Prior CABG. Cardiomegaly with normal pulmonary vascularity. Persistent left upper lobe nodule with partial clearing of adjacent infiltrate. Small left pleural effusion.  IMPRESSION: 1. Interim placement of left chest tube. Interim resolution of left pneumothorax. 2. Persistent left upper lobe nodule with partial clearing of adjacent infiltrate. Small left pleural effusion . 3. Prior CABG.  Stable cardiomegaly.   Electronically Signed   By: Marcello Moores  Register   On: 04/21/2015 07:26   Dg Chest 1 View  04/20/2015   ADDENDUM REPORT: 04/20/2015 13:44  ADDENDUM: The findings were also discussed by me with Dr. Earleen Newport via Johny Shock.   Electronically Signed   By: David  Martinique M.D.   On: 04/20/2015 13:44   04/20/2015   CLINICAL DATA:  Status post left-sided lung biopsy foreign upper lobe nodule  EXAM: CHEST  1 VIEW  COMPARISON:  CT scan of today's date from the biopsy.   FINDINGS: There is an approximately 40% left-sided pneumothorax. There is no significant pleural effusion. There is patchy density in the left perihilar region. There is no mediastinal shift. The right lung is well-expanded and clear. There are post CABG changes.  IMPRESSION: There is an approximately 40% left pneumothorax without mediastinal shift.  This report was called to [facility] by Jearld Shines, RT, and the report given to [place], RN and who will give the report to Dr. Earleen Newport.  Electronically Signed: By: David  Martinique M.D. On: 04/20/2015 13:38   Ct Biopsy  04/20/2015   CLINICAL DATA:  69 year old male with a history of left upper lobe nodule.  He has been referred for nodule biopsy.  EXAM: CT-GUIDED BIOPSY LEFT UPPER LOBE LUNG NODULE BIOPSY  MEDICATIONS AND MEDICAL HISTORY: Versed 0.5 mg, Fentanyl 50 mcg.  Additional Medications: None.  ANESTHESIA/SEDATION: Moderate sedation time: 25 minutes  PROCEDURE: The procedure, risks, benefits, and alternatives were explained to the patient. Specific risks discussed include bleeding, infection, pneumothorax, need for further procedure/treatment, air embolus, cardiopulmonary collapse, death. Questions regarding the procedure were encouraged and answered. The patient understands and consents to the procedure.  Scout CT of the chest was performed for planning purposes.  The left upper chest was prepped with chlorhexidine in a sterile fashion, and a sterile drape was applied covering the operative field. A sterile gown and sterile gloves were used for the procedure.  Once the patient is prepped and draped in the usual sterile fashion, the skin and subcutaneous tissues were generously infiltrated with 1% lidocaine for local anesthesia. A small stab incision was made with an 11 blade scalpel.  Under CT guidance, a(n) 17 gauge guide needle was advanced into the left upper lobe nodule. Three separate 18 gauge core biopsy were then obtained. At this time a  BioSentry device was deployed.  Final CT image was acquired.  Patient tolerated the procedure well and remained hemodynamically stable throughout.  No complications were encountered and no significant blood loss was encountered.  FINDINGS: CT imaging demonstrates target lesion within the left upper lobe.  Final CT image demonstrates expected pulmonary hemorrhage adjacent to the lesion, with no evidence of pneumothorax.  COMPLICATIONS: None  IMPRESSION: Status post left upper lobe nodule biopsy, with tissue specimen sent to pathology for complete histopathologic analysis.  Signed,  Dulcy Fanny. Earleen Newport, DO  [facility]  [facility]   Electronically Signed   By: Corrie Mckusick D.O.   On: 04/20/2015 11:32   Ct Image Guided Drainage By Percutaneous Catheter  04/20/2015   CLINICAL DATA:  69 year old male with a history of left upper lobe nodule requiring biopsy.  Biopsy was performed on today's date with no pneumothorax at the completion of the study. Delayed pneumothorax on 8 to our interval chest x-ray requiring evacuation.  EXAM: CT GUIDED PLACEMENT OF LEFT CHEST TUBE FOR PNEUMOTHORAX  ANESTHESIA/SEDATION: 1.0 Mg IV Versed 50 mcg IV Fentanyl  Total Moderate Sedation Time:  19 minutes  PROCEDURE: The procedure, risks, benefits, and alternatives were explained to the patient. Questions regarding the procedure were encouraged and answered. The patient understands and consents to the procedure.  Patient is position in the supine position on the gantry table and a CT of the chest was performed.  Site of insertion was selected in the mid axillary line at the level of the left nipple.  The left chest was prepped with chlorhexidinein a sterile  fashion, and a sterile drape was applied covering the operative field. A sterile gown and sterile gloves were used for the procedure. Local anesthesia was provided with 1% Lidocaine.  Once the patient is prepped and draped sterilely, the skin  and subcutaneous tissues were generously infiltrated with 1% lidocaine without epinephrine. A small incision was made with 11 blade scalpel, and a Yueh needle was advanced into the thoracic cavity under aspiration. Once air was aspirated the catheter was advanced off the needle in the needle was removed.  Using modified Seldinger technique a 10 French pigtail drain was placed into the thoracic cavity, confirmed with CT imaging.  Catheter was then attached to pleura vac.  The patient tolerated the procedure well and remained hemodynamically stable throughout.  No complications were encountered and no significant blood loss was encountered.  COMPLICATIONS: None  FINDINGS: CT of the chest demonstrates left-sided pneumothorax status post biopsy. A similar appearance of small pulmonary hemorrhage status post biopsy.  Images demonstrate pigtail catheter within the low left pleural space.  IMPRESSION: Status post CT-guided placement of left-sided chest tube for evacuation of delayed pneumothorax after left lung biopsy.  Signed,  Dulcy Fanny. Earleen Newport DO  [facility]  [facility]  PLAN: Chest tube will be on suction overnight with a pleura vac.  Chest x-ray may be performed in the morning at 6 a.m. to investigate for pneumothorax. If there is no residual pneumothorax on suction at 6 a.m., water seal may be applied. A repeat chest x-ray in 4 hours after placing on water seal can then be performed to investigate for recurrent pneumothorax. If no recurrent pneumothorax, chest tube may be removed.   Electronically Signed   By: Corrie Mckusick D.O.   On: 04/20/2015 17:06    EKG:   Orders placed or performed in visit on 07/27/12  . EKG 12-Lead    ASSESSMENT AND PLAN:   #1 left sided pneumothorax-this is likely result of the patient's biopsy.  status post chest tube placement, continue suction per Dr. Genevive Bi. Pain control.   #2 hypertension. stable. Continue metoprolol.  #3  history of bipolar disorder-continue Depakote.  #4 hypothyroidism-continue Synthroid.  #5 hyperlipidemia-continue atorvastatin.      All the records are reviewed and case discussed with Care Management/Social Workerr. Management plans discussed with the patient, his wife and son, and they are in agreement. More than 50% of the time was spent in counseling/coordination of care.  CODE STATUS: Full code  TOTAL TIME TAKING CARE OF THIS PATIENT: 35  minutes.   POSSIBLE D/C IN 2 DAYS, DEPENDING ON CLINICAL CONDITION.   Demetrios Loll M.D on 04/21/2015 at 2:04 PM  Between 7am to 6pm - Pager - [phone]  After 6pm go to [email or web address] - password [email or web address] [facility]  [facility]  Office  [phone]  CC: Primary care physician; [name], MD

## 2015-04-21 NOTE — Progress Notes (Signed)
Chief Complaint: Left pneumothorax after lung biopsy.  History of Present Illness: Marcus [name] is a 69 y.o. male status post CT guided biopsy of LUL lung nodule yesterday complicated by delayed PTX requiring placement of pigtail chest tube.  CXR this AM shows no further PTX on wall suction.  Has mild left chest pain currently.  [place] shows no air leak.  Past Medical History  Diagnosis Date  . Diabetes   . Vascular dementia   . Bipolar 1 disorder   . Stroke 1998  . Heart disease   . CAD (coronary artery disease)   . Hypertension   . Shortness of breath dyspnea   . Hypothyroidism   . Tuberculosis 2015    Past Surgical History  Procedure Laterality Date  . Cardiac bypass    . Coronary artery bypass graft    . Appendectomy      Allergies: Review of patient's allergies indicates no known allergies.  Medications: Prior to Admission medications   Medication Sig Start Date End Date Taking? Authorizing Provider  acetaminophen (TYLENOL) 325 MG tablet Take 650 mg by mouth every 4 (four) hours as needed for mild pain, fever or headache.    Yes Historical Provider, MD  aspirin EC 81 MG tablet Take 81 mg by mouth daily.   Yes Historical Provider, MD  atorvastatin (LIPITOR) 40 MG tablet Take 40 mg by mouth at bedtime.   Yes Historical Provider, MD  divalproex (DEPAKOTE ER) 250 MG 24 hr tablet Take 250 mg by mouth at bedtime. Pt takes with two '500mg'$  tablets.   Yes Historical Provider, MD  divalproex (DEPAKOTE ER) 500 MG 24 hr tablet Take 1,000 mg by mouth at bedtime. Pt takes with a '250mg'$  tablet.   Yes Historical Provider, MD  gabapentin (NEURONTIN) 600 MG tablet Take 600 mg by mouth at bedtime.   Yes Historical Provider, MD  levothyroxine (SYNTHROID, LEVOTHROID) 75 MCG tablet Take 75 mcg by mouth daily before breakfast.   Yes Historical Provider, MD  metoprolol succinate (TOPROL-XL) 50 MG 24 hr tablet Take 50 mg by mouth daily.    Yes Historical Provider, MD  OLANZapine  (ZYPREXA) 20 MG tablet Take 20 mg by mouth at bedtime.   Yes Historical Provider, MD  psyllium (REGULOID) 0.52 G capsule Take 0.52 g by mouth daily.   Yes Historical Provider, MD  Vitamin D, Ergocalciferol, (DRISDOL) 50000 UNITS CAPS capsule Take 50,000 Units by mouth every 30 (thirty) days. Pt takes on the first Monday of every month.   Yes Historical Provider, MD     Family History  Problem Relation Age of Onset  . Parkinson's disease Father     History   Social History  . Marital Status: Married    Spouse Name: N/A  . Number of Children: N/A  . Years of Education: N/A   Social History Main Topics  . Smoking status: Current Every Day Smoker -- 0.50 packs/day for 58 years    Types: Cigarettes  . Smokeless tobacco: Former Systems developer    Types: Snuff     Comment: used snuff in [employer]; uses cigarettes and cigars  . Alcohol Use: No  . Drug Use: No  . Sexual Activity: No   Other Topics Concern  . None   Social History Narrative    Review of Systems: A 12 point ROS discussed and pertinent positives are indicated in the HPI above.  All other systems are negative.  Review of Systems  Respiratory: Negative.   Cardiovascular: Positive  for chest pain. Negative for palpitations.     Vital Signs: BP 118/74 mmHg  Pulse 57  Temp(Src) 97.7 F (36.5 C) (Oral)  Resp 19  Ht '5\' 11"'$  (1.803 m)  Wt 137 lb (62.143 kg)  BMI 19.12 kg/m2  SpO2 99%  Physical Exam  Mallampati Score:  MD Evaluation Airway: WNL Heart: WNL Abdomen: WNL Chest/ Lungs: WNL ASA  Classification: 1 Mallampati/Airway Score: Two  Imaging: Dg Chest 1 View  04/21/2015   CLINICAL DATA:  Left chest tube .  EXAM: CHEST  1 VIEW  COMPARISON:  04/20/2015 .  FINDINGS: Interim placement of left chest tube. No pneumothorax. Mediastinum and hilar structures are normal. Prior CABG. Cardiomegaly with normal pulmonary vascularity. Persistent left upper lobe nodule with partial clearing of adjacent infiltrate. Small left  pleural effusion.  IMPRESSION: 1. Interim placement of left chest tube. Interim resolution of left pneumothorax. 2. Persistent left upper lobe nodule with partial clearing of adjacent infiltrate. Small left pleural effusion . 3. Prior CABG.  Stable cardiomegaly.   Electronically Signed   By: Marcello Moores  Register   On: 04/21/2015 07:26   Dg Chest 1 View  04/20/2015   ADDENDUM REPORT: 04/20/2015 13:44  ADDENDUM: The findings were also discussed by me with Dr. Earleen Newport via Johny Shock.   Electronically Signed   By: David  Martinique M.D.   On: 04/20/2015 13:44   04/20/2015   CLINICAL DATA:  Status post left-sided lung biopsy foreign upper lobe nodule  EXAM: CHEST  1 VIEW  COMPARISON:  CT scan of today's date from the biopsy.  FINDINGS: There is an approximately 40% left-sided pneumothorax. There is no significant pleural effusion. There is patchy density in the left perihilar region. There is no mediastinal shift. The right lung is well-expanded and clear. There are post CABG changes.  IMPRESSION: There is an approximately 40% left pneumothorax without mediastinal shift.  This report was called to [facility] by Jearld Shines, RT, and the report given to [place], RN and who will give the report to Dr. Earleen Newport.  Electronically Signed: By: David  Martinique M.D. On: 04/20/2015 13:38   Dg Ribs Unilateral Left  04/13/2015   CLINICAL DATA:  Left anterior rib pain. Lesion in the left eleventh rib on PET scan. Lung mass.  EXAM: LEFT RIBS - 2 VIEW  COMPARISON:  PET scan dated 04/12/2015 and CT scan of the chest dated 04/03/2015  FINDINGS: There is an old fracture posterior lateral aspect of the left eleventh rib but on PET scan the abnormal area is at the costochondral junction of the eleventh rib and not at the site of the fracture. On these radiographs there is no appreciable lesion at the costochondral junction. Irregular spiculated mass in the left upper lobe is again noted. Emphysema. Scarring at the left lung base laterally.  CABG.  IMPRESSION: No visible lesion at the costochondral junction the the lunate left eleventh rib. Old fracture of the left eleventh rib more posterior than the area of abnormality on PET-CT.  Emphysema.  Spiculated mass in the left upper lobe.   Electronically Signed   By: Lorriane Shire M.D.   On: 04/13/2015 14:13   Ct Chest Wo Contrast  04/03/2015   CLINICAL DATA:  History of left upper lobe pulmonary nodule.  EXAM: CT CHEST WITHOUT CONTRAST  TECHNIQUE: Multidetector CT imaging of the chest was performed following the standard protocol without IV contrast.  COMPARISON:  04/28/2013  FINDINGS: There is enlargement of a spiculated nodular mass in the lateral  aspect of the left upper lobe. This nodule now measures 1.9 x 1.5 x 1.7 cm (previously 12 mm in greatest diameter). Enlargement is highly worrisome for malignancy. Recommend Thoracic Surgery consultation. PET scan should also be considered for staging evaluation.  There are some small/ borderline mediastinal lymph nodes which appears stable in size. A lymph node just anterior to the distal trachea and carina measures 12 mm in short axis. An AP window lymph node measures 8 mm in short axis. Hilar lymph node evaluation is limited without IV contrast. However, no obvious hilar masses are identified.  Lungs show evidence of mild emphysematous disease in both lungs. No additional nodules are identified. No evidence of edema, infiltrate, pneumothorax or pleural fluid.  The patient has had prior CABG. The heart size is normal. No pericardial abnormalities. Visualized upper abdominal structures are unremarkable. Mild degenerative changes are present in the lower thoracic spine. No focal bony lesions are identified.  IMPRESSION: Interval enlargement of left upper lobe nodule. The spiculated nodule has more than doubled in volume and measures 1.9 cm in greatest diameter. This is highly worrisome for malignancy. Some associated small/borderline mediastinal lymph nodes  appears stable since the prior CT. Recommend Thoracic Surgical consultation and consideration of PET scan for staging evaluation.  These results will be called to the ordering clinician or representative by the Radiologist Assistant, and communication documented in the PACS or zVision Dashboard.   Electronically Signed   By: Aletta Edouard M.D.   On: 04/03/2015 11:23   Dg Sniff Test  04/11/2015   CLINICAL DATA:  69 year old male with pulmonary nodules. Elevated left hemidiaphragm. Evaluate motion. Subsequent encounter.  EXAM: CHEST FLUOROSCOPY  TECHNIQUE: Real-time fluoroscopic evaluation of the chest was performed.  FLUOROSCOPY TIME:  82.82 micro Gy cm2  COMPARISON:  04/03/2015 chest CT.  FINDINGS: Elevated left hemidiaphragm. With inspiration and expiration, the left hemidiaphragm does not move as well as the right hemidiaphragm however, the direction of motion is normal without paradoxical motion detected.  IMPRESSION: Elevated left hemidiaphragm. With inspiration and expiration, the left hemidiaphragm does not move as well as the right hemidiaphragm however, the direction of motion is normal without paradoxical motion detected.   Electronically Signed   By: Genia Del M.D.   On: 04/11/2015 09:27   Nm Pet Image Initial (pi) Skull Base To Thigh  04/12/2015   CLINICAL DATA:  Initial treatment strategy for left upper lobe pulmonary nodule.  EXAM: NUCLEAR MEDICINE PET SKULL BASE TO THIGH  TECHNIQUE: 12.0 mCi F-18 FDG was injected intravenously. Full-ring PET imaging was performed from the skull base to thigh after the radiotracer. CT data was obtained and used for attenuation correction and anatomic localization.  FASTING BLOOD GLUCOSE:  Value: 130 mg/dl  COMPARISON:  CT scan from 04/03/2015.  FINDINGS: NECK  No hypermetabolic lymph nodes in the neck.  CHEST  The 1.9 x 1.5 x 1.7 cm spiculated nodule seen recently in the peripheral aspect of the left upper lobe is markedly hypermetabolic with SUV max = 87.5.   There is new patchy airspace disease in the posterior aspect of the inferior left lower lobe. This shows low level FDG uptake on PET images. It is new in the short interval since the previous diagnostic CT and felt to most likely be related to an infectious/inflammatory process of the left lower lobe. SUV max = 3.3.  ABDOMEN/PELVIS  No abnormal hypermetabolic activity within the liver, pancreas, adrenal glands, or spleen. No hypermetabolic lymph nodes in the abdomen or  pelvis.  Tiny vascular calcifications seen in the hilum of each kidney. Low-attenuation lesions in both kidneys show no hypermetabolism on PET images, compatible with cysts. Atherosclerotic calcification is seen in the wall of the abdominal aorta without aneurysm.  SKELETON  Small hypermetabolic focus is seen in the lateral aspect of the left eleventh rib. This is not well seen on the current study and was not included on the previous chest CT. On image 162 of series 3 today, with appropriate window/ level settings, there appears to be a linear lucency in the rib at this level. There is no evidence for an expansile, sclerotic, or overtly lytic lesion at this location. As such, the uptake in this rib may well be related to fracture ( SUV max = 3.2). No other hypermetabolic bone lesions are evident.  IMPRESSION: 1. Left upper lobe pulmonary nodule is markedly hypermetabolic, consistent with primary bronchogenic neoplasm. 2. Interval development of patchy airspace disease in the posterior left lower lobe shows low level FDG accumulation. Features are most suggestive of an infectious or inflammatory process. 3. Focal uptake in the lateral aspect of the eleventh rib is felt to be most likely related to fracture as no discrete underlying bone lesion is evident, bud an early metastatic deposit cannot be entirely excluded. The area is not well demonstrated on today's non breath hold CT exam and this rib was not included on the recent diagnostic CT chest. Rib  detail films may be able to demonstrate a fracture at this location.   Electronically Signed   By: Misty Stanley M.D.   On: 04/12/2015 10:11   Ct Biopsy  04/20/2015   CLINICAL DATA:  69 year old male with a history of left upper lobe nodule.  He has been referred for nodule biopsy.  EXAM: CT-GUIDED BIOPSY LEFT UPPER LOBE LUNG NODULE BIOPSY  MEDICATIONS AND MEDICAL HISTORY: Versed 0.5 mg, Fentanyl 50 mcg.  Additional Medications: None.  ANESTHESIA/SEDATION: Moderate sedation time: 25 minutes  PROCEDURE: The procedure, risks, benefits, and alternatives were explained to the patient. Specific risks discussed include bleeding, infection, pneumothorax, need for further procedure/treatment, air embolus, cardiopulmonary collapse, death. Questions regarding the procedure were encouraged and answered. The patient understands and consents to the procedure.  Scout CT of the chest was performed for planning purposes.  The left upper chest was prepped with chlorhexidine in a sterile fashion, and a sterile drape was applied covering the operative field. A sterile gown and sterile gloves were used for the procedure.  Once the patient is prepped and draped in the usual sterile fashion, the skin and subcutaneous tissues were generously infiltrated with 1% lidocaine for local anesthesia. A small stab incision was made with an 11 blade scalpel.  Under CT guidance, a(n) 17 gauge guide needle was advanced into the left upper lobe nodule. Three separate 18 gauge core biopsy were then obtained. At this time a BioSentry device was deployed.  Final CT image was acquired.  Patient tolerated the procedure well and remained hemodynamically stable throughout.  No complications were encountered and no significant blood loss was encountered.  FINDINGS: CT imaging demonstrates target lesion within the left upper lobe.  Final CT image demonstrates expected pulmonary hemorrhage adjacent to the lesion, with no evidence of pneumothorax.   COMPLICATIONS: None  IMPRESSION: Status post left upper lobe nodule biopsy, with tissue specimen sent to pathology for complete histopathologic analysis.  Signed,  Dulcy Fanny. Earleen Newport, DO  [facility]  [facility]   Electronically Signed  By: Corrie Mckusick D.O.   On: 04/20/2015 11:32   Ct Image Guided Drainage By Percutaneous Catheter  04/20/2015   CLINICAL DATA:  69 year old male with a history of left upper lobe nodule requiring biopsy.  Biopsy was performed on today's date with no pneumothorax at the completion of the study. Delayed pneumothorax on 8 to our interval chest x-ray requiring evacuation.  EXAM: CT GUIDED PLACEMENT OF LEFT CHEST TUBE FOR PNEUMOTHORAX  ANESTHESIA/SEDATION: 1.0 Mg IV Versed 50 mcg IV Fentanyl  Total Moderate Sedation Time:  19 minutes  PROCEDURE: The procedure, risks, benefits, and alternatives were explained to the patient. Questions regarding the procedure were encouraged and answered. The patient understands and consents to the procedure.  Patient is position in the supine position on the gantry table and a CT of the chest was performed.  Site of insertion was selected in the mid axillary line at the level of the left nipple.  The left chest was prepped with chlorhexidinein a sterile fashion, and a sterile drape was applied covering the operative field. A sterile gown and sterile gloves were used for the procedure. Local anesthesia was provided with 1% Lidocaine.  Once the patient is prepped and draped sterilely, the skin and subcutaneous tissues were generously infiltrated with 1% lidocaine without epinephrine. A small incision was made with 11 blade scalpel, and a Yueh needle was advanced into the thoracic cavity under aspiration. Once air was aspirated the catheter was advanced off the needle in the needle was removed.  Using modified Seldinger technique a 10 French pigtail drain was placed into the thoracic cavity, confirmed with CT  imaging.  Catheter was then attached to pleura vac.  The patient tolerated the procedure well and remained hemodynamically stable throughout.  No complications were encountered and no significant blood loss was encountered.  COMPLICATIONS: None  FINDINGS: CT of the chest demonstrates left-sided pneumothorax status post biopsy. A similar appearance of small pulmonary hemorrhage status post biopsy.  Images demonstrate pigtail catheter within the low left pleural space.  IMPRESSION: Status post CT-guided placement of left-sided chest tube for evacuation of delayed pneumothorax after left lung biopsy.  Signed,  Dulcy Fanny. Earleen Newport DO  [facility]  [facility]  PLAN: Chest tube will be on suction overnight with a pleura vac.  Chest x-ray may be performed in the morning at 6 a.m. to investigate for pneumothorax. If there is no residual pneumothorax on suction at 6 a.m., water seal may be applied. A repeat chest x-ray in 4 hours after placing on water seal can then be performed to investigate for recurrent pneumothorax. If no recurrent pneumothorax, chest tube may be removed.   Electronically Signed   By: Corrie Mckusick D.O.   On: 04/20/2015 17:06    Labs:  CBC:  Recent Labs  04/20/15 0718 04/21/15 0503  WBC 10.7* 7.2  HGB 15.8 14.1  HCT 46.5 42.5  PLT 218 180    COAGS:  Recent Labs  04/20/15 0718  INR 0.91  APTT 26    BMP:  Recent Labs  04/20/15 0718 04/21/15 0503  NA  --  146*  K  --  4.4  CL  --  112*  CO2  --  31  GLUCOSE  --  116*  BUN  --  23*  CALCIUM  --  9.0  CREATININE 0.97 1.06  GFRNONAA >60 >60  GFRAA >60 >60    Assessment and Plan:  Left lung reexpanded after chest tube placement.  Given relatively large PTX after biopsy and underlying chronic lung disease, chest tube should remain on wall suction for at least another 24 hours.  Will coordinate trial of water seal and follow up CXR over weekend.  SignedAletta Edouard  T 04/21/2015, 4:07 PM

## 2015-04-21 NOTE — Care Management Note (Signed)
Case Management Note  Patient Details  Name: Marcus [name] MRN: [id number] Date of Birth: 08/09/1946  Subjective/Objective:                 Patient underwent biopsy and is post biopsy chest x-ray showed a left-sided pneumothorax. Patient is now status post chest tube placement. Patient currently requiring 2 Liters O2, patient does not chronically require O2.  Patient lives at home with wife and son.  Patient uses the pharmacy at [facility].  [facility] notified of admission.  Patient's wife states that they have a walker, Wheelchair, and shower chair at home.  Patient not currently open to home health services  Action/Plan: Nursing to wean O2 as tolerated.  If patient requires O2 at time of discharge patient will need qualifying O2 sat and diagnosis.  RN CM to follow for discharge planning.   Expected Discharge Date:                  Expected Discharge Plan:  Home/Self Care  In-House Referral:     Discharge planning Services     Post Acute Care Choice:    Choice offered to:     DME Arranged:    DME Agency:     HH Arranged:    HH Agency:     Status of Service:  In process, will continue to follow  Medicare Important Message Given:    Date Medicare IM Given:    Medicare IM give by:    Date Additional Medicare IM Given:    Additional Medicare Important Message give by:     If discussed at [place] of Stay Meetings, dates discussed:    Additional Comments:  Beverly Sessions, RN 04/21/2015, 10:20 AM

## 2015-04-21 NOTE — Progress Notes (Signed)
Marcus [name] Inpatient Post-Op Note  Patient ID: Marcus [name], male   DOB: 20-May-1946, 69 y.o.   MRN: [id number]  HISTORY: Admitted yesterday with left pneumothorax after lung biopsy.  Had a quiet night.  No shortness of breath or pain.  No fever.  Good apetite   Filed Vitals:   04/21/15 0809  BP: 118/74  Pulse: 57  Temp: 97.7 F (36.5 C)  Resp: 19     EXAM: Resp: Lungs are clear equal bilaterally with faint wheeze on left.  No respiratory distress, normal effort. Heart:  Regular without murmurs Abd:  Abdomen is soft, non distended and non tender. No masses are palpable.  There is no rebound and no guarding.  Neurological: Alert and oriented to person, place, and time. Coordination normal.  Skin: Skin is warm and dry. No rash noted. No diaphoretic. No erythema. No pallor.  Psychiatric: Normal mood and affect. Normal behavior. Judgment and thought content normal.    Small air leak seen on cough.    ASSESSMENT: Iatrogenic pneumothorax after lung biopsy Will keep chest tube to suction for today Consider water seal tomorrow and possible tube removal Sunday/Monday  Lung cancer Not a surgical candidate Would be happy to see in my office in followup where I can arrange appropriate Oncology and or Radiation Therapy treatment   PLAN:   Chest tube to remain to suction today Follow-up with me upon discharge    Nestor Lewandowsky, MD

## 2015-04-22 NOTE — Progress Notes (Signed)
[place] at [place] NAME: Marcus [name]    MR#:  [id number]  DATE OF BIRTH:  August 21, 1946  SUBJECTIVE:  CHIEF COMPLAINT:  No chief complaint on file.  left-sided chest tube.Marland Kitchen  REVIEW OF SYSTEMS:  CONSTITUTIONAL: No fever, fatigue or weakness.  EYES: No blurred or double vision.  EARS, NOSE, AND THROAT: No tinnitus or ear pain.  RESPIRATORY: No cough, shortness of breath, wheezing or hemoptysis.  CARDIOVASCULAR: Left side chest pain, no orthopnea, edema.  GASTROINTESTINAL: No nausea, vomiting, diarrhea or abdominal pain.  GENITOURINARY: No dysuria, hematuria.  ENDOCRINE: No polyuria, nocturia,  HEMATOLOGY: No anemia, easy bruising or bleeding SKIN: No rash or lesion. MUSCULOSKELETAL: No joint pain or arthritis.   NEUROLOGIC: No tingling, numbness, weakness.  PSYCHIATRY: No anxiety or depression.   DRUG ALLERGIES:  No Known Allergies  VITALS:  Blood pressure 113/79, pulse 72, temperature 98.1 F (36.7 C), temperature source Oral, resp. rate 16, height '5\' 11"'$  (1.803 m), weight 62.143 kg (137 lb), SpO2 91 %.  PHYSICAL EXAMINATION:  GENERAL:  69 y.o.-year-old patient lying in the bed with no acute distress.  EYES: Pupils equal, round, reactive to light and accommodation. No scleral icterus. Extraocular muscles intact.  HEENT: Head atraumatic, normocephalic. Oropharynx and nasopharynx clear.  NECK:  Supple, no jugular venous distention. No thyroid enlargement, no tenderness.  LUNGS: Normal breath sounds on the right side but weak on the right side, no wheezing, rales,rhonchi or crepitation. No use of accessory muscles of respiration. Chest tube on left side. CARDIOVASCULAR: S1, S2 normal. No murmurs, rubs, or gallops.  ABDOMEN: Soft, nontender, nondistended. Bowel sounds present. No organomegaly or mass.  EXTREMITIES: No pedal edema, cyanosis, or clubbing.  NEUROLOGIC: Cranial nerves II through XII are intact. Muscle strength 5/5  in all extremities. Sensation intact. Gait not checked.  PSYCHIATRIC: The patient is alert and oriented x 3.  SKIN: No obvious rash, lesion, or ulcer.    LABORATORY PANEL:   CBC  Recent Labs Lab 04/21/15 0503  WBC 7.2  HGB 14.1  HCT 42.5  PLT 180   ------------------------------------------------------------------------------------------------------------------  Chemistries   Recent Labs Lab 04/21/15 0503  NA 146*  K 4.4  CL 112*  CO2 31  GLUCOSE 116*  BUN 23*  CREATININE 1.06  CALCIUM 9.0   ------------------------------------------------------------------------------------------------------------------  Cardiac Enzymes No results for input(s): TROPONINI in the last 168 hours. ------------------------------------------------------------------------------------------------------------------  RADIOLOGY:  Dg Chest 1 View  04/21/2015   CLINICAL DATA:  Left chest tube .  EXAM: CHEST  1 VIEW  COMPARISON:  04/20/2015 .  FINDINGS: Interim placement of left chest tube. No pneumothorax. Mediastinum and hilar structures are normal. Prior CABG. Cardiomegaly with normal pulmonary vascularity. Persistent left upper lobe nodule with partial clearing of adjacent infiltrate. Small left pleural effusion.  IMPRESSION: 1. Interim placement of left chest tube. Interim resolution of left pneumothorax. 2. Persistent left upper lobe nodule with partial clearing of adjacent infiltrate. Small left pleural effusion . 3. Prior CABG.  Stable cardiomegaly.   Electronically Signed   By: Marcello Moores  Register   On: 04/21/2015 07:26    EKG:   Orders placed or performed in visit on 07/27/12  . EKG 12-Lead    ASSESSMENT AND PLAN:   #1 left sided pneumothorax-this is likely result of the patient's biopsy.  status post chest tube placement, continue suction per Dr. Genevive Bi. Pain control. Might keep Chest tube for 1-2 days.  #2 hypertension. stable. Continue metoprolol. stable  #3 history  of bipolar  disorder-continue Depakote.  #4 hypothyroidism-continue Synthroid.  #5 hyperlipidemia-continue atorvastatin.   All the records are reviewed and case discussed with Care Management/Social Workerr. Management plans discussed with the patient, his wife and son, and they are in agreement. More than 50% of the time was spent in counseling/coordination of care.  CODE STATUS: Full code  TOTAL TIME TAKING CARE OF THIS PATIENT: 35  minutes.   POSSIBLE D/C IN 2 DAYS, DEPENDING ON CLINICAL CONDITION.   Vaughan Basta M.D on 04/22/2015 at 11:36 PM  Between 7am to 6pm - Pager - [phone]  After 6pm go to [email or web address] - password [email or web address] [facility]  [place] [facility]  Office  [phone]  CC: Primary care physician; [name], MD

## 2015-04-23 ENCOUNTER — Inpatient Hospital Stay: Payer: Medicare (Managed Care)

## 2015-04-23 NOTE — Discharge Summary (Signed)
Marcus [name] NAME: Marcus [name]    MR#:  [id number]  DATE OF BIRTH:  12/01/1945  DATE OF ADMISSION:  04/20/2015 ADMITTING PHYSICIAN: Henreitta Leber, MD  DATE OF DISCHARGE: 04/23/2015  PRIMARY CARE PHYSICIAN: [name], MD    ADMISSION DIAGNOSIS:  Malignant neoplasm of upper lobe of left lung [C34.12]  DISCHARGE DIAGNOSIS:  Active Problems:   Pneumothorax after biopsy   Malignant neoplasm of upper lobe of left lung   History of chest tube placement   SECONDARY DIAGNOSIS:   Past Medical History  Diagnosis Date  . Diabetes   . Vascular dementia   . Bipolar 1 disorder   . Stroke 1998  . Heart disease   . CAD (coronary artery disease)   . Hypertension   . Shortness of breath dyspnea   . Hypothyroidism   . Tuberculosis 2015    HOSPITAL COURSE:  #1 left sided pneumothorax-this is likely result of the patient's biopsy. status post chest tube placement, continue suction per Dr. Genevive Bi. Pain control. He tolerated water seal, he does not have any pneumothorax now. Waiting for his chest tube to be removed- then will discharge him.  #2 hypertension. stable. Continue metoprolol. stable  #3 history of bipolar disorder-continue Depakote.  #4 hypothyroidism-continue Synthroid.  #5 hyperlipidemia-continue atorvastatin.    DISCHARGE CONDITIONS:   Stable.  CONSULTS OBTAINED:  Treatment Team:  Nestor Lewandowsky, MD  DRUG ALLERGIES:  No Known Allergies  DISCHARGE MEDICATIONS:   Current Discharge Medication List    CONTINUE these medications which have NOT CHANGED   Details  acetaminophen (TYLENOL) 325 MG tablet Take 650 mg by mouth every 4 (four) hours as needed for mild pain, fever or headache.     atorvastatin (LIPITOR) 40 MG tablet Take 40 mg by mouth at bedtime.    divalproex (DEPAKOTE ER) 250 MG 24 hr tablet Take 250 mg by mouth at bedtime. Pt takes with two '500mg'$  tablets.    gabapentin  (NEURONTIN) 600 MG tablet Take 600 mg by mouth at bedtime.    levothyroxine (SYNTHROID, LEVOTHROID) 75 MCG tablet Take 75 mcg by mouth daily before breakfast.    metoprolol succinate (TOPROL-XL) 50 MG 24 hr tablet Take 50 mg by mouth daily.     OLANZapine (ZYPREXA) 20 MG tablet Take 20 mg by mouth at bedtime.    psyllium (REGULOID) 0.52 G capsule Take 0.52 g by mouth daily.      STOP taking these medications     aspirin EC 81 MG tablet      Vitamin D, Ergocalciferol, (DRISDOL) 50000 UNITS CAPS capsule      aspirin 81 MG tablet      cholecalciferol (VITAMIN D) 1000 UNITS tablet      ENSURE PLUS (ENSURE PLUS) LIQD          DISCHARGE INSTRUCTIONS:    Follow with Dr. Genevive Bi in office in 1 week.  If you experience worsening of your admission symptoms, develop shortness of breath, life threatening emergency, suicidal or homicidal thoughts you must seek medical attention immediately by calling 911 or calling your MD immediately  if symptoms less severe.  You Must read complete instructions/literature along with all the possible adverse reactions/side effects for all the Medicines you take and that have been prescribed to you. Take any new Medicines after you have completely understood and accept all the possible adverse reactions/side effects.   Please note  You were cared for by a hospitalist during  your hospital stay. If you have any questions about your discharge medications or the care you received while you were in the hospital after you are discharged, you can call the unit and asked to speak with the hospitalist on call if the hospitalist that took care of you is not available. Once you are discharged, your primary care physician will handle any further medical issues. Please note that NO REFILLS for any discharge medications will be authorized once you are discharged, as it is imperative that you return to your primary care physician (or establish a relationship with a primary  care physician if you do not have one) for your aftercare needs so that they can reassess your need for medications and monitor your lab values.    Today   CHIEF COMPLAINT:  No chief complaint on file.   HISTORY OF PRESENT ILLNESS:  Marcus [name]  is a 69 y.o. male with a known history of diabetes, vascular dementia, bipolar disorder, history of coronary disease status post bypass, hypertension, who presented to the hospital due to an elective CT-guided biopsy for a left upper lobe lung mass. Patient underwent biopsy and is post biopsy chest x-ray showed a left-sided pneumothorax. Patient is now status post chest tube placement and is clinically doing fine. Hospitalist services were contacted for further treatment and evaluation. Patient presently denies any chest pain, worsening shortness of breath, nausea, vomiting, fever, chills and any other associated symptoms presently. He has some mild pain near Chest tube site.   VITAL SIGNS:  Blood pressure 112/61, pulse 57, temperature 97.4 F (36.3 C), temperature source Oral, resp. rate 16, height '5\' 11"'$  (1.803 m), weight 62.143 kg (137 lb), SpO2 95 %.  I/O:   Intake/Output Summary (Last 24 hours) at 04/23/15 1417 Last data filed at 04/23/15 1300  Gross per 24 hour  Intake   1200 ml  Output   2039 ml  Net   -839 ml    PHYSICAL EXAMINATION:   GENERAL: 69 y.o.-year-old patient lying in the bed with no acute distress.  EYES: Pupils equal, round, reactive to light and accommodation. No scleral icterus. Extraocular muscles intact.  HEENT: Head atraumatic, normocephalic. Oropharynx and nasopharynx clear.  NECK: Supple, no jugular venous distention. No thyroid enlargement, no tenderness.  LUNGS: Normal breath sounds on the right side but weak on the right side, no wheezing, rales,rhonchi or crepitation. No use of accessory muscles of respiration. Chest tube on left side. CARDIOVASCULAR: S1, S2 normal. No murmurs, rubs, or gallops.   ABDOMEN: Soft, nontender, nondistended. Bowel sounds present. No organomegaly or mass.  EXTREMITIES: No pedal edema, cyanosis, or clubbing.  NEUROLOGIC: Cranial nerves II through XII are intact. Muscle strength 5/5 in all extremities. Sensation intact. Gait not checked.  PSYCHIATRIC: The patient is alert and oriented x 3.  SKIN: No obvious rash, lesion, or ulcer.   DATA REVIEW:   CBC  Recent Labs Lab 04/21/15 0503  WBC 7.2  HGB 14.1  HCT 42.5  PLT 180    Chemistries   Recent Labs Lab 04/21/15 0503  NA 146*  K 4.4  CL 112*  CO2 31  GLUCOSE 116*  BUN 23*  CREATININE 1.06  CALCIUM 9.0    Cardiac Enzymes No results for input(s): TROPONINI in the last 168 hours.  Microbiology Results  Results for orders placed or performed in visit on 07/27/12  Culture, blood (single)     Status: None   Collection Time: 07/27/12  7:39 PM  Result Value Ref Range  Status   Micro Text Report   Final       COMMENT                   NO GROWTH AEROBICALLY/ANAEROBICALLY IN 5 DAYS   ANTIBIOTIC                                                      Culture, blood (single)     Status: None   Collection Time: 07/27/12  7:39 PM  Result Value Ref Range Status   Micro Text Report   Final       COMMENT                   NO GROWTH AEROBICALLY/ANAEROBICALLY IN 5 DAYS   ANTIBIOTIC                                                      Urine culture     Status: None   Collection Time: 07/27/12  7:39 PM  Result Value Ref Range Status   Micro Text Report   Final       SOURCE: CLEAN CATCH    COMMENT                   MIXED BACTERIAL ORGANISMS   COMMENT                   RESULTS SUGGESTIVE OF CONTAMINATION   COMMENT                   SUGGEST RECOLLECTION   ANTIBIOTIC                                                      Culture, expectorated sputum-assessment     Status: None   Collection Time: 07/29/12 11:39 AM  Result Value Ref Range Status   Micro Text Report   Final        COMMENT                   APPEARS TO BE NORMAL FLORA AT 36 HOURS   GRAM STAIN                GOOD SPECIMEN-80-90% WBC   GRAM STAIN                MODERATE WHITE BLOOD CELLS   GRAM STAIN                FEW GRAM POSITIVE COCCI   GRAM STAIN                FEW GRAM POSITIVE ROD   ANTIBIOTIC                                                      Culture, expectorated sputum-assessment  Status: None   Collection Time: 07/31/12  9:12 AM  Result Value Ref Range Status   Micro Text Report   Final       COMMENT                   APPEARS TO BE NORMAL FLORA AT 32 HOURS   GRAM STAIN                EXCELLENT SPECIMEN-90-100% WBC   GRAM STAIN                MANY WHITE BLOOD CELLS SEEN   GRAM STAIN                FEW LARGE GRAM POSITIVE COCCI IN SINGLES, [place]   GRAM STAIN                AND CLUSTERS   ANTIBIOTIC                                                        RADIOLOGY:  Dg Chest 1 View  04/23/2015   CLINICAL DATA:  69 year old male with a history of left lung nodule biopsy and post biopsy pneumothorax.  Currently on water seal.  EXAM: CHEST  1 VIEW  COMPARISON:  04/21/2015, 04/20/2015, CT 04/20/2015  FINDINGS: Cardiomediastinal silhouette unchanged. Atherosclerotic calcifications of the aortic arch.  Surgical changes of median sternotomy and CABG.  Unchanged position of pigtail thoracostomy tube on the left.  No pneumothorax identified.  Nodule of the left upper lobe is better characterized on prior cross-sectional imaging.  No confluent airspace disease.  Right lung remains relatively clear.  IMPRESSION: No pneumothorax identified on the left, with unchanged position of thoracostomy tube.  Left lung nodule, better characterized on prior CT studies.  Median sternotomy and CABG.  Signed,  Dulcy Fanny. Earleen Newport, DO  [facility]  [facility]   Electronically Signed   By: Corrie Mckusick D.O.   On: 04/23/2015 08:05     Management plans discussed with the  patient, family and they are in agreement.  CODE STATUS:     Code Status Orders        Start     Ordered   04/20/15 1710  Full code   Continuous     04/20/15 1709      TOTAL TIME TAKING CARE OF THIS PATIENT: 35 minutes.    Vaughan Basta M.D on 04/23/2015 at 2:17 PM  Between 7am to 6pm - Pager - [phone]  After 6pm go to [email or web address] - password [email or web address]  [place] [facility]  Office  [phone]  CC: Primary care physician; [name], MD

## 2015-04-23 NOTE — Discharge Instructions (Signed)
Contact your provider if you have symptoms of infection such as high fever >101, pain not controlled by pain medications or signs of infection at insertion site, increased redness, increased drainage.

## 2015-04-23 NOTE — Progress Notes (Signed)
[place] at [place] NAME: Marcus [name]    MR#:  [id number]  DATE OF BIRTH:  03-Sep-1946  SUBJECTIVE:  CHIEF COMPLAINT:  No chief complaint on file.  left-sided chest tube.Marland Kitchen  REVIEW OF SYSTEMS:  CONSTITUTIONAL: No fever, fatigue or weakness.  EYES: No blurred or double vision.  EARS, NOSE, AND THROAT: No tinnitus or ear pain.  RESPIRATORY: No cough, shortness of breath, wheezing or hemoptysis.  CARDIOVASCULAR: Left side chest pain, no orthopnea, edema.  GASTROINTESTINAL: No nausea, vomiting, diarrhea or abdominal pain.  GENITOURINARY: No dysuria, hematuria.  ENDOCRINE: No polyuria, nocturia,  HEMATOLOGY: No anemia, easy bruising or bleeding SKIN: No rash or lesion. MUSCULOSKELETAL: No joint pain or arthritis.   NEUROLOGIC: No tingling, numbness, weakness.  PSYCHIATRY: No anxiety or depression.   DRUG ALLERGIES:  No Known Allergies  VITALS:  Blood pressure 112/61, pulse 57, temperature 97.4 F (36.3 C), temperature source Oral, resp. rate 16, height '5\' 11"'$  (1.803 m), weight 62.143 kg (137 lb), SpO2 95 %.  PHYSICAL EXAMINATION:  GENERAL:  69 y.o.-year-old patient lying in the bed with no acute distress.  EYES: Pupils equal, round, reactive to light and accommodation. No scleral icterus. Extraocular muscles intact.  HEENT: Head atraumatic, normocephalic. Oropharynx and nasopharynx clear.  NECK:  Supple, no jugular venous distention. No thyroid enlargement, no tenderness.  LUNGS: Normal breath sounds on the right side but weak on the right side, no wheezing, rales,rhonchi or crepitation. No use of accessory muscles of respiration. Chest tube on left side. CARDIOVASCULAR: S1, S2 normal. No murmurs, rubs, or gallops.  ABDOMEN: Soft, nontender, nondistended. Bowel sounds present. No organomegaly or mass.  EXTREMITIES: No pedal edema, cyanosis, or clubbing.  NEUROLOGIC: Cranial nerves II through XII are intact. Muscle strength 5/5  in all extremities. Sensation intact. Gait not checked.  PSYCHIATRIC: The patient is alert and oriented x 3.  SKIN: No obvious rash, lesion, or ulcer.    LABORATORY PANEL:   CBC  Recent Labs Lab 04/21/15 0503  WBC 7.2  HGB 14.1  HCT 42.5  PLT 180   ------------------------------------------------------------------------------------------------------------------  Chemistries   Recent Labs Lab 04/21/15 0503  NA 146*  K 4.4  CL 112*  CO2 31  GLUCOSE 116*  BUN 23*  CREATININE 1.06  CALCIUM 9.0   ------------------------------------------------------------------------------------------------------------------  Cardiac Enzymes No results for input(s): TROPONINI in the last 168 hours. ------------------------------------------------------------------------------------------------------------------  RADIOLOGY:  Dg Chest 1 View  04/23/2015   CLINICAL DATA:  69 year old male with a history of left lung nodule biopsy and post biopsy pneumothorax.  Currently on water seal.  EXAM: CHEST  1 VIEW  COMPARISON:  04/21/2015, 04/20/2015, CT 04/20/2015  FINDINGS: Cardiomediastinal silhouette unchanged. Atherosclerotic calcifications of the aortic arch.  Surgical changes of median sternotomy and CABG.  Unchanged position of pigtail thoracostomy tube on the left.  No pneumothorax identified.  Nodule of the left upper lobe is better characterized on prior cross-sectional imaging.  No confluent airspace disease.  Right lung remains relatively clear.  IMPRESSION: No pneumothorax identified on the left, with unchanged position of thoracostomy tube.  Left lung nodule, better characterized on prior CT studies.  Median sternotomy and CABG.  Signed,  Dulcy Fanny. Earleen Newport, DO  [facility]  [facility]   Electronically Signed   By: Corrie Mckusick D.O.   On: 04/23/2015 08:05    EKG:   Orders placed or performed in visit on 07/27/12  . EKG 12-Lead  ASSESSMENT  AND PLAN:   #1 left sided pneumothorax-this is likely result of the patient's biopsy.  status post chest tube placement, continue suction per Dr. Genevive Bi. Pain control. He tolerated water seal, he does not have any pneumothorax now. Waiting for his chest tube to be removed- then will discharge him.  #2 hypertension. stable. Continue metoprolol. stable  #3 history of bipolar disorder-continue Depakote.  #4 hypothyroidism-continue Synthroid.  #5 hyperlipidemia-continue atorvastatin.   All the records are reviewed and case discussed with Care Management/Social Workerr. Management plans discussed with the patient, his wife and son, and they are in agreement. More than 50% of the time was spent in counseling/coordination of care.  CODE STATUS: Full code  TOTAL TIME TAKING CARE OF THIS PATIENT: 35  minutes.   POSSIBLE D/C IN 2 DAYS, DEPENDING ON CLINICAL CONDITION.   Vaughan Basta M.D on 04/23/2015 at 11:59 AM  Between 7am to 6pm - Pager - [phone]  After 6pm go to [email or web address] - password [email or web address] [facility]  [place] [facility]  Office  [phone]  CC: Primary care physician; [name], MD

## 2015-04-23 NOTE — Progress Notes (Signed)
Patient ID: Marcus [name], male   DOB: Jul 24, 1946, 69 y.o.   MRN: [id number]   Chest tube placed to water seal yesterday by interventional radiology. I was not aware of this.  Patient is without complaints.  Lungs are clear bilaterally. There is no air leak seen on the Pleur-evac.  Review of chest x-ray demonstrates no evidence of pneumothorax. No effusion. Chest tube is in good position.  Impression iatrogenic left pneumothorax.  Plan at this point I will leave the 2 [facility] radiology seems to be managing it. Will discuss with Dr. Genevive Bi.

## 2015-04-23 NOTE — Progress Notes (Signed)
Interventional Radiology Progress Note  69 year old male SP LUL biopsy, Thursday, August 4, with PTX.  Treated with left chest tube.    Saturday CXR with no PTX.  Water seal applied overnight Saturday, with CXR in am Sunday showing no PTX.    Bedside removal of chest tube 11:30am Sunday.    Patient remains asymptomatic.    No need for repeat CXR.    Patient can be D/C'd at the discretion of the primary team, with attention to follow up with Dr. Genevive Bi for potential oncology planning.    Appreciate assistance with inpatient admission.   Please call with questions/concerns.  [phone]  Signed,  Dulcy Fanny Earleen Newport, DO

## 2015-04-24 LAB — SURGICAL PATHOLOGY

## 2015-04-27 ENCOUNTER — Inpatient Hospital Stay: Payer: Medicare (Managed Care) | Attending: Cardiothoracic Surgery | Admitting: Cardiothoracic Surgery

## 2015-04-27 VITALS — BP 105/62 | HR 47 | Temp 96.5°F | Wt 127.9 lb

## 2015-04-27 DIAGNOSIS — C3412 Malignant neoplasm of upper lobe, left bronchus or lung: Secondary | ICD-10-CM

## 2015-04-27 DIAGNOSIS — R918 Other nonspecific abnormal finding of lung field: Secondary | ICD-10-CM | POA: Diagnosis not present

## 2015-04-27 NOTE — Progress Notes (Signed)
[name] Post-[facility] Note  Patient ID: Marcus [name], male   DOB: 24-Dec-1945, 69 y.o.   MRN: [id number]  HISTORY: This patient is seen today in follow-up. He been previously seen by me in the office or we arranged for a left upper lobe needle biopsy. That was accomplished last week with the resulting left pneumothorax requiring chest tube insertion. His chest tube was removed. The patient denies any shortness of breath at the present time. He denies any chest pain. I did discuss his care with Dr. Welford Roche as and Dr. Damita Dunnings. 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 Dr. Earleen Newport in interventional radiology felt that the patient may be a candidate for radiofrequency ablation.   Filed Vitals:   04/27/15 0917  BP: 105/62  Pulse: 47  Temp: 96.5 F (35.8 C)     EXAM: Resp: Lungs are clear bilaterally but distant.  No respiratory distress, normal effort. Heart:  Irregular without murmurs consistent with afib. Abd:  Abdomen is soft, non distended and non tender. No masses are palpable.  There is no rebound and no guarding.  Neurological: Alert and oriented to person, place, and time. Coordination normal.  Skin: Skin is warm and dry. No rash noted. No diaphoretic. No erythema. No pallor.  Psychiatric: Normal mood and affect. Normal behavior. Judgment and thought content normal.    ASSESSMENT: I believe this patient has a stage I carcinoma the lung. Given his severe underlying functional debility I do not believe that he is a candidate for surgical intervention.   PLAN:   I discussed this with the patient and his son today. I believe that radiation therapy is an excellent option for him. I would like him to meet with Dr. Donella Stade for his consultation regarding the potential for radiation therapy for management of his left upper lobe mass.    Nestor Lewandowsky, MD

## 2015-05-03 ENCOUNTER — Ambulatory Visit
Admission: RE | Admit: 2015-05-03 | Discharge: 2015-05-03 | Disposition: A | Discharge status: Home / Self Care | Payer: Medicare (Managed Care) | Source: Ambulatory Visit | Attending: Radiation Oncology | Admitting: Radiation Oncology

## 2015-05-03 ENCOUNTER — Encounter: Payer: Self-pay | Admitting: Radiation Oncology

## 2015-05-03 VITALS — BP 101/76 | HR 90 | Temp 96.7°F | Resp 20 | Wt 126.3 lb

## 2015-05-03 DIAGNOSIS — Z510 Encounter for antineoplastic radiation therapy: Secondary | ICD-10-CM | POA: Insufficient documentation

## 2015-05-03 DIAGNOSIS — C3412 Malignant neoplasm of upper lobe, left bronchus or lung: Secondary | ICD-10-CM | POA: Insufficient documentation

## 2015-05-03 NOTE — Consult Note (Signed)
Except an outstanding is perfect of Radiation Oncology NEW PATIENT EVALUATION  Name: Marcus [name]  MRN: [id number]  Date:   05/03/2015     DOB: 20-Nov-1945   This 69 y.o. 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  REFERRING PHYSICIAN: [name], MD  CHIEF COMPLAINT:  Chief Complaint  Patient presents with  . Lung Cancer    Pt is here for initial consultation of lung cancer.     DIAGNOSIS: The encounter diagnosis was Malignant neoplasm of upper lobe of left lung.   PREVIOUS INVESTIGATIONS:  CT scan PET CT scan reviewed Clinical notes reviewed Pathology reviewed  HPI: Patient is a 69 year old male with obvious cognitive dysfunction secondary to underlying psychiatric disorder whose been followed for about 2 years with a small lung nodule in the left upper lobe. Repeat CT scan recently showed increase in size. Patient has a 50-pack-year smoking history continues to smoke. He underwent biopsy with CT-guided technique positive for large cell neuroendocrine carcinoma. Unfortunately he had a pneumothorax necessitating chest tube placement which he has fully recovered from at this time. He is a nonsurgical candidate according to surgical oncology. Patient has prior history of CVA, bipolar disorder and history of coronary artery disease status post graft. He specifically denies cough hemoptysis or chest tightness. He is accompanied by his wife today who gives the majority of history.  PLANNED TREATMENT REGIMEN: SB RT to the left upper lobe  PAST MEDICAL HISTORY:  has a past medical history of Diabetes; Vascular dementia; Bipolar 1 disorder; Stroke (1998); Heart disease; CAD (coronary artery disease); Hypertension; Shortness of breath dyspnea; Hypothyroidism; and Tuberculosis (2015).    PAST SURGICAL HISTORY:   Past Surgical History  Procedure Laterality Date  . Cardiac bypass    . Coronary artery bypass graft    . Appendectomy      FAMILY HISTORY: family history includes Parkinson's disease in his father.  SOCIAL HISTORY:  reports that he has been smoking Cigarettes.  He has a 29 pack-year smoking history. He has quit using smokeless tobacco. His smokeless tobacco use included Snuff. He reports that he does not drink alcohol or use illicit drugs.  ALLERGIES: Review of patient's allergies indicates no known allergies.  MEDICATIONS:  Current Outpatient Prescriptions  Medication Sig Dispense Refill  . acetaminophen (TYLENOL) 325 MG tablet Take 650 mg by mouth every 4 (four) hours as needed for mild pain, fever or headache.     Marland Kitchen atorvastatin (LIPITOR) 40 MG tablet Take 40 mg by mouth at bedtime.    . divalproex (DEPAKOTE ER) 250 MG 24 hr tablet Take 250 mg by mouth at bedtime. Pt takes with two '500mg'$  tablets.    . gabapentin (NEURONTIN) 600 MG tablet Take 600 mg by mouth at bedtime.    Marland Kitchen levothyroxine (SYNTHROID, LEVOTHROID) 75 MCG tablet Take 75 mcg by mouth daily before breakfast.    . metoprolol succinate (TOPROL-XL) 50 MG 24 hr tablet Take 50 mg by mouth daily.     Marland Kitchen OLANZapine (ZYPREXA) 20 MG tablet Take 20 mg by mouth at bedtime.    . psyllium (REGULOID) 0.52 G capsule Take 0.52 g by mouth daily.     No current facility-administered medications for this encounter.    ECOG PERFORMANCE STATUS:  0 - Asymptomatic  REVIEW OF SYSTEMS: Patient has multiple comorbid morbidities sister with his prior CVA  including bipolar disorder cognitive decline. He is a walker for ambulation. Patient denies any weight loss, fatigue, weakness, fever, chills or night sweats. Patient denies any loss of vision, blurred vision. Patient denies any ringing  of the ears or hearing loss. No irregular heartbeat. Patient denies heart murmur or history of fainting. Patient denies any chest pain or pain radiating to her  upper extremities. Patient denies any shortness of breath, difficulty breathing at night, cough or hemoptysis. Patient denies any swelling in the lower legs. Patient denies any nausea vomiting, vomiting of blood, or coffee ground material in the vomitus. Patient denies any stomach pain. Patient states has had normal bowel movements no significant constipation or diarrhea. Patient denies any dysuria, hematuria or significant nocturia. Patient denies any problems walking, swelling in the joints or loss of balance. Patient denies any skin changes, loss of hair or loss of weight. Patient denies any excessive worrying or anxiety or significant depression. Patient denies any problems with insomnia. Patient denies excessive thirst, polyuria, polydipsia. Patient denies any swollen glands, patient denies easy bruising or easy bleeding. Patient denies any recent infections, allergies or URI. Patient "s visual fields have not changed significantly in recent time.    PHYSICAL EXAM: BP 101/76 mmHg  Pulse 90  Temp(Src) 96.7 F (35.9 C)  Resp 20  Wt 126 lb 5.2 oz (57.3 kg) Well-developed male appears older than stated age with obvious cognitive decline. No cervical or supraclavicular adenopathy is appreciated lungs are clear to A&P cardiac examination shows regular rate and rhythm. Well-developed well-nourished patient in NAD. HEENT reveals PERLA, EOMI, discs not visualized.  Oral cavity is clear. No oral mucosal lesions are identified. Neck is clear without evidence of cervical or supraclavicular adenopathy. Lungs are clear to A&P. Cardiac examination is essentially unremarkable with regular rate and rhythm without murmur rub or thrill. Abdomen is benign with no organomegaly or masses noted. Motor sensory and DTR levels are equal and symmetric in the upper and lower extremities. Cranial nerves II through XII are grossly intact. Proprioception is intact. No peripheral adenopathy or edema is identified. No motor or  sensory levels are noted. Crude visual fields are within normal range.   LABORATORY DATA: Pathology report reviewed    RADIOLOGY RESULTS: PET CT scan and CT scans reviewed   IMPRESSION: Stage I non-small cell lung cancer of the left upper lobe large cell neuroendocrine tumor in 69 year old male nonoperable by history of significant comorbidities as outlined above.  PLAN: At this time think the patient will be an excellent candidate for SPR T. Would plan on delivering 5000 cGy in 5 fractions. Risks and benefits of treatment including possible cough, fatigue, loss of normal lung volume, alteration of blood counts all were described in detail to the patient and his wife. They seem to both comprehend my treatment plan well. I have set up and ordered CT simulation next week. I've discussed the case with medical oncology based on the poorly differentiated nature of his tumor will refer him to medical oncology although with his significant comorbidities do not believe he is a good candidate for systemic chemotherapy.  I would like to take this opportunity for allowing me to participate in the care of your patient.Armstead Peaks., MD

## 2015-05-10 ENCOUNTER — Ambulatory Visit
Admission: RE | Admit: 2015-05-10 | Discharge: 2015-05-10 | Disposition: A | Discharge status: Home / Self Care | Payer: Medicare (Managed Care) | Source: Ambulatory Visit | Attending: Radiation Oncology | Admitting: Radiation Oncology

## 2015-05-10 DIAGNOSIS — Z510 Encounter for antineoplastic radiation therapy: Secondary | ICD-10-CM | POA: Diagnosis not present

## 2015-05-10 DIAGNOSIS — C3412 Malignant neoplasm of upper lobe, left bronchus or lung: Secondary | ICD-10-CM | POA: Diagnosis not present

## 2015-05-11 DIAGNOSIS — Z510 Encounter for antineoplastic radiation therapy: Secondary | ICD-10-CM | POA: Diagnosis not present

## 2015-05-23 DIAGNOSIS — Z510 Encounter for antineoplastic radiation therapy: Secondary | ICD-10-CM | POA: Diagnosis not present

## 2015-05-24 ENCOUNTER — Ambulatory Visit
Admission: RE | Admit: 2015-05-24 | Discharge: 2015-05-24 | Disposition: A | Discharge status: Home / Self Care | Payer: Medicare (Managed Care) | Source: Ambulatory Visit | Attending: Radiation Oncology | Admitting: Radiation Oncology

## 2015-05-24 DIAGNOSIS — Z510 Encounter for antineoplastic radiation therapy: Secondary | ICD-10-CM | POA: Diagnosis not present

## 2015-05-26 ENCOUNTER — Ambulatory Visit
Admission: RE | Admit: 2015-05-26 | Discharge: 2015-05-26 | Disposition: A | Discharge status: Home / Self Care | Payer: Medicare (Managed Care) | Source: Ambulatory Visit | Attending: Radiation Oncology | Admitting: Radiation Oncology

## 2015-05-26 DIAGNOSIS — Z510 Encounter for antineoplastic radiation therapy: Secondary | ICD-10-CM | POA: Diagnosis not present

## 2015-05-29 ENCOUNTER — Ambulatory Visit
Admission: RE | Admit: 2015-05-29 | Discharge: 2015-05-29 | Disposition: A | Discharge status: Home / Self Care | Payer: Medicare (Managed Care) | Source: Ambulatory Visit | Attending: Radiation Oncology | Admitting: Radiation Oncology

## 2015-05-29 DIAGNOSIS — Z510 Encounter for antineoplastic radiation therapy: Secondary | ICD-10-CM | POA: Diagnosis not present

## 2015-05-30 ENCOUNTER — Ambulatory Visit: Payer: Medicare (Managed Care) | Admitting: Radiation Oncology

## 2015-05-31 ENCOUNTER — Ambulatory Visit
Admission: RE | Admit: 2015-05-31 | Discharge: 2015-05-31 | Disposition: A | Discharge status: Home / Self Care | Payer: Medicare (Managed Care) | Source: Ambulatory Visit | Attending: Radiation Oncology | Admitting: Radiation Oncology

## 2015-05-31 DIAGNOSIS — Z510 Encounter for antineoplastic radiation therapy: Secondary | ICD-10-CM | POA: Diagnosis not present

## 2015-06-02 ENCOUNTER — Ambulatory Visit: Payer: Medicare (Managed Care) | Admitting: Radiation Oncology

## 2015-06-05 ENCOUNTER — Ambulatory Visit
Admission: RE | Admit: 2015-06-05 | Discharge: 2015-06-05 | Disposition: A | Discharge status: Home / Self Care | Payer: Medicare (Managed Care) | Source: Ambulatory Visit | Attending: Radiation Oncology | Admitting: Radiation Oncology

## 2015-06-05 DIAGNOSIS — Z510 Encounter for antineoplastic radiation therapy: Secondary | ICD-10-CM | POA: Diagnosis not present

## 2015-06-06 ENCOUNTER — Inpatient Hospital Stay
Admission: EM | Admit: 2015-06-06 | Discharge: 2015-06-09 | DRG: 480 | Disposition: A | Discharge status: Skilled Nursing Facility | Payer: Medicare (Managed Care) | Attending: Internal Medicine | Admitting: Internal Medicine

## 2015-06-06 ENCOUNTER — Emergency Department: Payer: Medicare (Managed Care)

## 2015-06-06 ENCOUNTER — Encounter: Payer: Self-pay | Admitting: Emergency Medicine

## 2015-06-06 DIAGNOSIS — F319 Bipolar disorder, unspecified: Secondary | ICD-10-CM | POA: Diagnosis present

## 2015-06-06 DIAGNOSIS — S72142A Displaced intertrochanteric fracture of left femur, initial encounter for closed fracture: Secondary | ICD-10-CM | POA: Diagnosis present

## 2015-06-06 DIAGNOSIS — E870 Hyperosmolality and hypernatremia: Secondary | ICD-10-CM | POA: Diagnosis present

## 2015-06-06 DIAGNOSIS — W19XXXA Unspecified fall, initial encounter: Secondary | ICD-10-CM | POA: Diagnosis present

## 2015-06-06 DIAGNOSIS — S7292XA Unspecified fracture of left femur, initial encounter for closed fracture: Secondary | ICD-10-CM | POA: Diagnosis present

## 2015-06-06 DIAGNOSIS — N390 Urinary tract infection, site not specified: Secondary | ICD-10-CM

## 2015-06-06 DIAGNOSIS — C3490 Malignant neoplasm of unspecified part of unspecified bronchus or lung: Secondary | ICD-10-CM

## 2015-06-06 DIAGNOSIS — E119 Type 2 diabetes mellitus without complications: Secondary | ICD-10-CM | POA: Diagnosis present

## 2015-06-06 DIAGNOSIS — D649 Anemia, unspecified: Secondary | ICD-10-CM | POA: Diagnosis present

## 2015-06-06 DIAGNOSIS — E039 Hypothyroidism, unspecified: Secondary | ICD-10-CM | POA: Diagnosis present

## 2015-06-06 DIAGNOSIS — Z951 Presence of aortocoronary bypass graft: Secondary | ICD-10-CM

## 2015-06-06 DIAGNOSIS — Z8611 Personal history of tuberculosis: Secondary | ICD-10-CM

## 2015-06-06 DIAGNOSIS — Z79899 Other long term (current) drug therapy: Secondary | ICD-10-CM | POA: Diagnosis not present

## 2015-06-06 DIAGNOSIS — G9341 Metabolic encephalopathy: Secondary | ICD-10-CM

## 2015-06-06 DIAGNOSIS — I10 Essential (primary) hypertension: Secondary | ICD-10-CM | POA: Diagnosis present

## 2015-06-06 DIAGNOSIS — Z87891 Personal history of nicotine dependence: Secondary | ICD-10-CM | POA: Diagnosis not present

## 2015-06-06 DIAGNOSIS — Z7982 Long term (current) use of aspirin: Secondary | ICD-10-CM

## 2015-06-06 DIAGNOSIS — S72002A Fracture of unspecified part of neck of left femur, initial encounter for closed fracture: Secondary | ICD-10-CM | POA: Diagnosis present

## 2015-06-06 DIAGNOSIS — C3412 Malignant neoplasm of upper lobe, left bronchus or lung: Secondary | ICD-10-CM | POA: Diagnosis present

## 2015-06-06 DIAGNOSIS — I2510 Atherosclerotic heart disease of native coronary artery without angina pectoris: Secondary | ICD-10-CM | POA: Diagnosis present

## 2015-06-06 DIAGNOSIS — F0150 Vascular dementia without behavioral disturbance: Secondary | ICD-10-CM | POA: Diagnosis present

## 2015-06-06 DIAGNOSIS — Z8673 Personal history of transient ischemic attack (TIA), and cerebral infarction without residual deficits: Secondary | ICD-10-CM

## 2015-06-06 DIAGNOSIS — S72009A Fracture of unspecified part of neck of unspecified femur, initial encounter for closed fracture: Secondary | ICD-10-CM

## 2015-06-06 DIAGNOSIS — R5081 Fever presenting with conditions classified elsewhere: Secondary | ICD-10-CM

## 2015-06-06 HISTORY — DX: Malignant (primary) neoplasm, unspecified: C80.1

## 2015-06-06 LAB — COMPREHENSIVE METABOLIC PANEL
ALBUMIN: 3 g/dL — AB (ref 3.5–5.0)
ALT: 16 U/L — ABNORMAL LOW (ref 17–63)
ALT: 15 U/L — AB (ref 17–63)
ANION GAP: 5 (ref 5–15)
ANION GAP: 6 (ref 5–15)
AST: 20 U/L (ref 15–41)
AST: 16 U/L (ref 15–41)
Albumin: 3 g/dL — ABNORMAL LOW (ref 3.5–5.0)
Alkaline Phosphatase: 67 U/L (ref 38–126)
Alkaline Phosphatase: 69 U/L (ref 38–126)
BUN: 15 mg/dL (ref 6–20)
BUN: 15 mg/dL (ref 6–20)
CALCIUM: 8.8 mg/dL — AB (ref 8.9–10.3)
CHLORIDE: 108 mmol/L (ref 101–111)
CHLORIDE: 109 mmol/L (ref 101–111)
CO2: 28 mmol/L (ref 22–32)
CO2: 30 mmol/L (ref 22–32)
CREATININE: 1 mg/dL (ref 0.61–1.24)
Calcium: 8.7 mg/dL — ABNORMAL LOW (ref 8.9–10.3)
Creatinine, Ser: 1.09 mg/dL (ref 0.61–1.24)
GFR calc Af Amer: >60 mL/min (ref >60)
GFR calc non Af Amer: >60 mL/min (ref >60)
Glucose, Bld: 183 mg/dL — ABNORMAL HIGH (ref 65–99)
Glucose, Bld: 80 mg/dL (ref 65–99)
Potassium: 4.1 mmol/L (ref 3.5–5.1)
Potassium: 4.4 mmol/L (ref 3.5–5.1)
Sodium: 141 mmol/L (ref 135–145)
Sodium: 145 mmol/L (ref 135–145)
Total Bilirubin: 0.5 mg/dL (ref 0.3–1.2)
Total Bilirubin: 0.6 mg/dL (ref 0.3–1.2)
Total Protein: 6.3 g/dL — ABNORMAL LOW (ref 6.5–8.1)
Total Protein: 6.3 g/dL — ABNORMAL LOW (ref 6.5–8.1)

## 2015-06-06 LAB — CBC WITH DIFFERENTIAL/PLATELET
Basophils Absolute: 0 10*3/uL (ref 0–0.1)
Basophils Absolute: 0 10*3/uL (ref 0–0.1)
Basophils Relative: 0 %
EOS ABS: 0.1 10*3/uL (ref 0–0.7)
EOS PCT: 1 %
Eosinophils Absolute: 0.1 10*3/uL (ref 0–0.7)
Eosinophils Relative: 1 %
HCT: 34 % — ABNORMAL LOW (ref 40.0–52.0)
HCT: 34 % — ABNORMAL LOW (ref 40.0–52.0)
Hemoglobin: 11.5 g/dL — ABNORMAL LOW (ref 13.0–18.0)
Hemoglobin: 11.1 g/dL — ABNORMAL LOW (ref 13.0–18.0)
LYMPHS ABS: 1.3 10*3/uL (ref 1.0–3.6)
LYMPHS PCT: 21 %
Lymphocytes Relative: 13 %
Lymphs Abs: 1 10*3/uL (ref 1.0–3.6)
MCH: 35.9 pg — ABNORMAL HIGH (ref 26.0–34.0)
MCH: 34.8 pg — AB (ref 26.0–34.0)
MCHC: 33.8 g/dL (ref 32.0–36.0)
MCHC: 32.5 g/dL (ref 32.0–36.0)
MCV: 106.1 fL — ABNORMAL HIGH (ref 80.0–100.0)
MCV: 106.9 fL — AB (ref 80.0–100.0)
MONO ABS: 0.9 10*3/uL (ref 0.2–1.0)
MONO ABS: 0.8 10*3/uL (ref 0.2–1.0)
MONOS PCT: 13 %
Neutro Abs: 5.6 10*3/uL (ref 1.4–6.5)
Neutro Abs: 4.1 10*3/uL (ref 1.4–6.5)
Neutrophils Relative %: 74 %
Neutrophils Relative %: 65 %
PLATELETS: 197 10*3/uL (ref 150–440)
PLATELETS: 204 10*3/uL (ref 150–440)
RBC: 3.2 MIL/uL — ABNORMAL LOW (ref 4.40–5.90)
RBC: 3.18 MIL/uL — AB (ref 4.40–5.90)
RDW: 15 % — AB (ref 11.5–14.5)
RDW: 15.2 % — ABNORMAL HIGH (ref 11.5–14.5)
WBC: 7.5 10*3/uL (ref 3.8–10.6)
WBC: 6.3 10*3/uL (ref 3.8–10.6)

## 2015-06-06 LAB — GLUCOSE, CAPILLARY
GLUCOSE-CAPILLARY: 151 mg/dL — AB (ref 65–99)
Glucose-Capillary: 126 mg/dL — ABNORMAL HIGH (ref 65–99)

## 2015-06-06 LAB — PROTIME-INR
INR: 0.94
INR: 0.96
Prothrombin Time: 12.8 seconds (ref 11.4–15.0)
Prothrombin Time: 13 seconds (ref 11.4–15.0)

## 2015-06-06 LAB — TYPE AND SCREEN
ABO/RH(D): B POS
ANTIBODY SCREEN: NEGATIVE

## 2015-06-06 LAB — APTT
APTT: 29 s (ref 24–36)
aPTT: 23 seconds — ABNORMAL LOW (ref 24–36)

## 2015-06-06 LAB — ABO/RH: ABO/RH(D): B POS

## 2015-06-06 MED ORDER — MORPHINE SULFATE (PF) 4 MG/ML IV SOLN
4.0000 mg | INTRAVENOUS | Status: DC | PRN
  Administered 2015-06-06 – 2015-06-07 (×3): 4 mg via INTRAVENOUS
  Filled 2015-06-06 (×3): qty 1

## 2015-06-06 MED ORDER — MORPHINE SULFATE (PF) 2 MG/ML IV SOLN: 0.5000 mg | INTRAVENOUS | Status: DC | PRN

## 2015-06-06 MED ORDER — GUAIFENESIN ER 600 MG PO TB12: 600.0000 mg | ORAL_TABLET | Freq: Two times a day (BID) | ORAL | Status: DC | PRN

## 2015-06-06 MED ORDER — BISACODYL 5 MG PO TBEC: 5.0000 mg | DELAYED_RELEASE_TABLET | Freq: Every day | ORAL | Status: DC | PRN

## 2015-06-06 MED ORDER — SODIUM CHLORIDE 0.9 % IV SOLN
INTRAVENOUS | Status: DC
Start: 2015-06-06 — End: 2015-06-07
  Administered 2015-06-06: 13:00:00 via INTRAVENOUS

## 2015-06-06 MED ORDER — OXYCODONE HCL 5 MG PO TABS
5.0000 mg | ORAL_TABLET | ORAL | Status: DC | PRN
  Administered 2015-06-07 – 2015-06-08 (×4): 5 mg via ORAL
  Filled 2015-06-06 (×4): qty 1

## 2015-06-06 MED ORDER — LEVOTHYROXINE SODIUM 75 MCG PO TABS
75.0000 ug | ORAL_TABLET | Freq: Every day | ORAL | Status: DC
  Administered 2015-06-08 – 2015-06-09 (×2): 75 ug via ORAL
  Filled 2015-06-06 (×2): qty 1

## 2015-06-06 MED ORDER — INSULIN ASPART 100 UNIT/ML ~~LOC~~ SOLN
0.0000 [IU] | Freq: Three times a day (TID) | SUBCUTANEOUS | Status: DC
  Administered 2015-06-06 – 2015-06-07 (×3): 1 [IU] via SUBCUTANEOUS
  Administered 2015-06-08: 5 [IU] via SUBCUTANEOUS
  Administered 2015-06-08: 7 [IU] via SUBCUTANEOUS
  Administered 2015-06-09 (×2): 1 [IU] via SUBCUTANEOUS
  Filled 2015-06-06: qty 1
  Filled 2015-06-06: qty 5
  Filled 2015-06-06 (×3): qty 1
  Filled 2015-06-06: qty 7

## 2015-06-06 MED ORDER — SODIUM CHLORIDE 0.9 % IV BOLUS (SEPSIS)
500.0000 mL | Freq: Once | INTRAVENOUS | Status: AC
  Administered 2015-06-06: 500 mL via INTRAVENOUS

## 2015-06-06 MED ORDER — DM-GUAIFENESIN ER 30-600 MG PO TB12
1.0000 | ORAL_TABLET | Freq: Two times a day (BID) | ORAL | Status: DC | PRN
  Filled 2015-06-06: qty 1

## 2015-06-06 MED ORDER — CLINDAMYCIN PHOSPHATE 600 MG/50ML IV SOLN
600.0000 mg | Freq: Once | INTRAVENOUS | Status: AC
  Administered 2015-06-07: 600 mg via INTRAVENOUS
  Filled 2015-06-06: qty 50

## 2015-06-06 MED ORDER — DIVALPROEX SODIUM ER 250 MG PO TB24
250.0000 mg | ORAL_TABLET | Freq: Every day | ORAL | Status: DC
  Administered 2015-06-06: 250 mg via ORAL
  Filled 2015-06-06 (×2): qty 1

## 2015-06-06 MED ORDER — METHOCARBAMOL 500 MG PO TABS: 500.0000 mg | ORAL_TABLET | Freq: Four times a day (QID) | ORAL | Status: DC | PRN

## 2015-06-06 MED ORDER — SENNOSIDES-DOCUSATE SODIUM 8.6-50 MG PO TABS: 1.0000 | ORAL_TABLET | Freq: Every evening | ORAL | Status: DC | PRN

## 2015-06-06 MED ORDER — GABAPENTIN 600 MG PO TABS
600.0000 mg | ORAL_TABLET | Freq: Every day | ORAL | Status: DC
  Administered 2015-06-06 – 2015-06-08 (×3): 600 mg via ORAL
  Filled 2015-06-06 (×3): qty 1

## 2015-06-06 MED ORDER — DOCUSATE SODIUM 100 MG PO CAPS
100.0000 mg | ORAL_CAPSULE | Freq: Two times a day (BID) | ORAL | Status: DC
  Administered 2015-06-06 – 2015-06-09 (×5): 100 mg via ORAL
  Filled 2015-06-06 (×5): qty 1

## 2015-06-06 MED ORDER — LOPERAMIDE HCL 2 MG PO CAPS
2.0000 mg | ORAL_CAPSULE | Freq: Four times a day (QID) | ORAL | Status: DC | PRN
Start: 2015-06-06 — End: 2015-06-09

## 2015-06-06 MED ORDER — PSYLLIUM 95 % PO PACK
1.0000 | PACK | Freq: Every day | ORAL | Status: DC
Start: 2015-06-06 — End: 2015-06-09
  Administered 2015-06-08 – 2015-06-09 (×2): 1 via ORAL
  Filled 2015-06-06 (×2): qty 1

## 2015-06-06 MED ORDER — ONDANSETRON HCL 4 MG/2ML IJ SOLN
INTRAMUSCULAR | Status: AC
  Administered 2015-06-06: 4 mg via INTRAVENOUS
  Filled 2015-06-06: qty 2

## 2015-06-06 MED ORDER — MORPHINE SULFATE (PF) 4 MG/ML IV SOLN
4.0000 mg | Freq: Once | INTRAVENOUS | Status: AC
  Administered 2015-06-06: 4 mg via INTRAVENOUS

## 2015-06-06 MED ORDER — HYDROCODONE-ACETAMINOPHEN 5-325 MG PO TABS
1.0000 | ORAL_TABLET | Freq: Four times a day (QID) | ORAL | Status: DC | PRN
  Administered 2015-06-06 – 2015-06-07 (×3): 1 via ORAL
  Filled 2015-06-06 (×3): qty 1

## 2015-06-06 MED ORDER — ONDANSETRON HCL 4 MG/2ML IJ SOLN
4.0000 mg | Freq: Once | INTRAMUSCULAR | Status: AC
  Administered 2015-06-06: 4 mg via INTRAVENOUS

## 2015-06-06 MED ORDER — DEXTROMETHORPHAN POLISTIREX ER 30 MG/5ML PO SUER
30.0000 mg | Freq: Two times a day (BID) | ORAL | Status: DC | PRN
  Filled 2015-06-06: qty 5

## 2015-06-06 MED ORDER — ACETAMINOPHEN 325 MG PO TABS
650.0000 mg | ORAL_TABLET | Freq: Four times a day (QID) | ORAL | Status: DC | PRN
  Administered 2015-06-07: 650 mg via ORAL
  Filled 2015-06-06: qty 2

## 2015-06-06 MED ORDER — ATORVASTATIN CALCIUM 20 MG PO TABS
40.0000 mg | ORAL_TABLET | Freq: Every day | ORAL | Status: DC
  Administered 2015-06-06 – 2015-06-08 (×3): 40 mg via ORAL
  Filled 2015-06-06 (×3): qty 2

## 2015-06-06 MED ORDER — ZOLPIDEM TARTRATE 5 MG PO TABS: 5.0000 mg | ORAL_TABLET | Freq: Every evening | ORAL | Status: DC | PRN

## 2015-06-06 MED ORDER — OLANZAPINE 5 MG PO TABS
20.0000 mg | ORAL_TABLET | Freq: Every day | ORAL | Status: DC
  Administered 2015-06-06 – 2015-06-08 (×3): 20 mg via ORAL
  Filled 2015-06-06 (×3): qty 4

## 2015-06-06 MED ORDER — ONDANSETRON HCL 4 MG/2ML IJ SOLN: 4.0000 mg | Freq: Four times a day (QID) | INTRAMUSCULAR | Status: DC | PRN

## 2015-06-06 MED ORDER — ONDANSETRON HCL 4 MG PO TABS
4.0000 mg | ORAL_TABLET | Freq: Four times a day (QID) | ORAL | Status: DC | PRN
Start: 2015-06-06 — End: 2015-06-09

## 2015-06-06 MED ORDER — FUROSEMIDE 20 MG PO TABS: 20.0000 mg | ORAL_TABLET | Freq: Every day | ORAL | Status: DC | PRN

## 2015-06-06 MED ORDER — METHOCARBAMOL 1000 MG/10ML IJ SOLN
500.0000 mg | Freq: Four times a day (QID) | INTRAVENOUS | Status: DC | PRN
  Filled 2015-06-06: qty 5

## 2015-06-06 MED ORDER — ACETAMINOPHEN 650 MG RE SUPP: 650.0000 mg | Freq: Four times a day (QID) | RECTAL | Status: DC | PRN

## 2015-06-06 MED ORDER — METOPROLOL SUCCINATE ER 50 MG PO TB24
50.0000 mg | ORAL_TABLET | Freq: Every day | ORAL | Status: DC
  Administered 2015-06-07 – 2015-06-09 (×3): 50 mg via ORAL
  Filled 2015-06-06 (×3): qty 1

## 2015-06-06 MED ORDER — DIVALPROEX SODIUM ER 500 MG PO TB24
1000.0000 mg | ORAL_TABLET | Freq: Every day | ORAL | Status: DC
  Administered 2015-06-06 – 2015-06-08 (×3): 1000 mg via ORAL
  Filled 2015-06-06 (×3): qty 2

## 2015-06-06 MED ORDER — INSULIN ASPART 100 UNIT/ML ~~LOC~~ SOLN: 0.0000 [IU] | Freq: Every day | SUBCUTANEOUS | Status: DC

## 2015-06-06 MED ORDER — SENNA 8.6 MG PO TABS
1.0000 | ORAL_TABLET | Freq: Two times a day (BID) | ORAL | Status: DC
  Administered 2015-06-06 – 2015-06-09 (×5): 8.6 mg via ORAL
  Filled 2015-06-06 (×5): qty 1

## 2015-06-06 MED ORDER — ALBUTEROL SULFATE (2.5 MG/3ML) 0.083% IN NEBU: 2.5000 mg | INHALATION_SOLUTION | RESPIRATORY_TRACT | Status: DC | PRN

## 2015-06-06 MED ORDER — CEFAZOLIN SODIUM-DEXTROSE 2-3 GM-% IV SOLR
2.0000 g | Freq: Once | INTRAVENOUS | Status: AC
  Administered 2015-06-07: 2 g via INTRAVENOUS
  Filled 2015-06-06: qty 50

## 2015-06-06 MED ORDER — MORPHINE SULFATE (PF) 4 MG/ML IV SOLN
INTRAVENOUS | Status: AC
  Administered 2015-06-06: 4 mg via INTRAVENOUS
  Filled 2015-06-06: qty 1

## 2015-06-06 NOTE — Consult Note (Signed)
ORTHOPAEDIC CONSULTATION  REQUESTING PHYSICIAN: Demetrios Loll, MD  Chief Complaint: Left hip pain  HPI: Marcus [name] is a 69 y.o. male who complains of  left hip pain secondary to a fall this morning.  The patient was attempting to sit in a chair and missed the chair.  He was brought to the emergency room where exam and x-rays revealed a displaced base of the neck fracture of the left hip.  He is admitted for medical evaluation and surgery.  He has been cleared for surgery by Dr. Bridgett Larsson of the medical service.  We will plan on surgery tomorrow morning after hydration overnight.  Risks and benefits of surgery versus nonoperative treatment were discussed with the patient has wife and son.  They are in agreement with proceeding with surgery and the postoperative protocol was discussed with them as well.  Past Medical History  Diagnosis Date  . Diabetes   . Vascular dementia   . Bipolar 1 disorder   . Stroke 1998  . Heart disease   . CAD (coronary artery disease)   . Hypertension   . Shortness of breath dyspnea   . Hypothyroidism   . Tuberculosis 2015  . Cancer    Past Surgical History  Procedure Laterality Date  . Cardiac bypass    . Coronary artery bypass graft    . Appendectomy     Social History   Social History  . Marital Status: Married    Spouse Name: N/A  . Number of Children: N/A  . Years of Education: N/A   Social History Main Topics  . Smoking status: Former Smoker -- 0.50 packs/day for 58 years    Types: Cigarettes    Quit date: 06/05/2015  . Smokeless tobacco: Former Systems developer    Types: Snuff     Comment: used snuff in [employer]; uses cigarettes and cigars  . Alcohol Use: No  . Drug Use: No  . Sexual Activity: No   Other Topics Concern  . None   Social History Narrative   Family History  Problem Relation Age of Onset  . Parkinson's disease Father    No Known Allergies Prior to Admission medications   Medication Sig Start Date End Date Taking?  Authorizing Benzion Mesta  acetaminophen (TYLENOL) 325 MG tablet Take 650 mg by mouth every 4 (four) hours as needed for mild pain, fever or headache.    Yes Historical Anakaren Campion, MD  aspirin EC 81 MG tablet Take 81 mg by mouth daily.   Yes Historical Sia Gabrielsen, MD  atorvastatin (LIPITOR) 40 MG tablet Take 40 mg by mouth at bedtime.   Yes Historical Shantika Bermea, MD  dextromethorphan-guaiFENesin (MUCINEX DM) 30-600 MG per 12 hr tablet Take 1 tablet by mouth every 12 (twelve) hours as needed for cough.   Yes Historical Shandrea Lusk, MD  divalproex (DEPAKOTE ER) 250 MG 24 hr tablet Take 250 mg by mouth at bedtime. Pt takes with two '500mg'$  tablets.   Yes Historical Jimi Giza, MD  divalproex (DEPAKOTE ER) 500 MG 24 hr tablet Take 1,000 mg by mouth at bedtime.   Yes Historical Chi Garlow, MD  furosemide (LASIX) 20 MG tablet Take 20 mg by mouth daily as needed for edema.   Yes Historical Quinterrius Errington, MD  gabapentin (NEURONTIN) 600 MG tablet Take 600 mg by mouth at bedtime.   Yes Historical Remberto Lienhard, MD  levothyroxine (SYNTHROID, LEVOTHROID) 75 MCG tablet Take 75 mcg by mouth daily before breakfast.   Yes Historical Jakyah Bradby, MD  loperamide (IMODIUM A-D) 2 MG tablet Take  2 mg by mouth 4 (four) times daily as needed for diarrhea or loose stools.   Yes Historical Domenique Southers, MD  metoprolol succinate (TOPROL-XL) 50 MG 24 hr tablet Take 50 mg by mouth daily.    Yes Historical Anamika Kueker, MD  OLANZapine (ZYPREXA) 20 MG tablet Take 20 mg by mouth at bedtime.   Yes Historical Allesandra Huebsch, MD  psyllium (REGULOID) 0.52 G capsule Take 0.52 g by mouth daily.   Yes Historical Samaa Ueda, MD  Vitamin D, Ergocalciferol, (DRISDOL) 50000 UNITS CAPS capsule Take 50,000 Units by mouth every 30 (thirty) days.   Yes Historical Khale Nigh, MD  Vitamins A & D (VITAMIN A & D) ointment Apply 1 application topically 2 (two) times daily as needed (barrier).   Yes Historical Arvine Clayburn, MD   Dg Chest 1 View  06/06/2015   CLINICAL DATA:  Fall while trying to 6 in  chair. Lung cancer with radiation. Screening, preop.  EXAM: CHEST  1 VIEW  COMPARISON:  04/23/2015  FINDINGS: Left upper lobe lung nodule again noted peripherally. Interval removal of left pleural pigtail drainage catheter. No pneumothorax. Prior median sternotomy and CABG. Mild cardiomegaly. No effusions. No acute airspace opacities.  IMPRESSION: Left upper lobe nodule. Interval removal of left pleural drainage catheter without pneumothorax.  Cardiomegaly.   Electronically Signed   By: Rolm Baptise M.D.   On: 06/06/2015 11:32   Dg Hip Unilat With Pelvis 2-3 Views Left  06/06/2015   CLINICAL DATA:  Acute left hip pain after falling this morning at home. Initial encounter.  EXAM: DG HIP (WITH OR WITHOUT PELVIS) 2-3V LEFT  COMPARISON:  None.  FINDINGS: Moderately angulated fracture is seen involving the trochanteric region of the proximal left femur. This appears to be closed and posttraumatic. Femoral head is situated within the acetabulum.  IMPRESSION: Moderately angulated intertrochanteric fracture of proximal left femur.   Electronically Signed   By: Marijo Conception, M.D.   On: 06/06/2015 11:25    Positive ROS: All other systems have been reviewed and were otherwise negative with the exception of those mentioned in the HPI and as above.  Physical Exam: General: Alert, no acute distress Cardiovascular: No pedal edema Respiratory: No cyanosis, no use of accessory musculature GI: No organomegaly, abdomen is soft and non-tender Skin: No lesions in the area of chief complaint Neurologic: Sensation intact distally Psychiatric: Patient is competent for consent with normal mood and affect Lymphatic: No axillary or cervical lymphadenopathy  MUSCULOSKELETAL: The left leg is shortened and externally rotated severely.  He has severe pain with attempts at movement of the hip.  He has intact neurovascular status distally, and the skin is intact.  The right leg is normal and there is no evidence of other  orthopedic injury.  Assessment: Displaced base of the neck fracture left hip  Plan: Open reduction internal fixation left hip tomorrow morning    Park Breed, MD [phone]   06/06/2015 1:49 PM

## 2015-06-06 NOTE — Progress Notes (Signed)
Dr. Bridgett Larsson notified of incorrect administration of beer to patient due to wrong communication. Pharmacy delivered bag directed for room 138, Marcus [name] Control and instrumentation engineer) sent them to this nurse. They stated they had a beer for room 138. I asked if it was room 138, and she said yes. Wrong patient sticker on bag. It was intended for room 148. Dr Bridgett Larsson notified and no new orders, no adverse effects or events. Continue to monitor.

## 2015-06-06 NOTE — H&P (Signed)
[place] [facility] [place] NAME: Marcus [name]    MR#:  [id number]  DATE OF BIRTH:  Jul 15, 1946  DATE OF ADMISSION:  06/06/2015  PRIMARY CARE PHYSICIAN: [name], MD   REQUESTING/REFERRING PHYSICIAN: Joanne Gavel, MD  CHIEF COMPLAINT:   Chief Complaint  Patient presents with  . Fall   fall in the left hip pain today.  HISTORY OF PRESENT ILLNESS:  Burke Terry  is a 69 y.o. male with a known history of hypertension, diabetes, CAD and lung cancer. The patient was admitted for left side pneumothorax at due to lung biopsy last summer months. He was discharged to home and has been stable. The patient was trying to back up into his chair this morning and then missed the chair, falling to the ground. He denies any loss of consciousness or syncope. He denies any other symptoms except and left hip pain.  PAST MEDICAL HISTORY:   Past Medical History  Diagnosis Date  . Diabetes   . Vascular dementia   . Bipolar 1 disorder   . Stroke 1998  . Heart disease   . CAD (coronary artery disease)   . Hypertension   . Shortness of breath dyspnea   . Hypothyroidism   . Tuberculosis 2015  . Cancer     PAST SURGICAL HISTORY:   Past Surgical History  Procedure Laterality Date  . Cardiac bypass    . Coronary artery bypass graft    . Appendectomy      SOCIAL HISTORY:   Social History  Substance Use Topics  . Smoking status: Former Smoker -- 0.50 packs/day for 58 years    Types: Cigarettes    Quit date: 06/05/2015  . Smokeless tobacco: Former Systems developer    Types: Snuff     Comment: used snuff in [employer]; uses cigarettes and cigars  . Alcohol Use: No    FAMILY HISTORY:   Family History  Problem Relation Age of Onset  . Parkinson's disease Father     DRUG ALLERGIES:  No Known Allergies  REVIEW OF SYSTEMS:  CONSTITUTIONAL: No fever, fatigue or weakness.  EYES: No blurred or double vision.  EARS, NOSE, AND THROAT: No  tinnitus or ear pain.  RESPIRATORY: No cough, shortness of breath, wheezing or hemoptysis.  CARDIOVASCULAR: No chest pain, orthopnea, edema.  GASTROINTESTINAL: No nausea, vomiting, diarrhea or abdominal pain.  GENITOURINARY: No dysuria, hematuria.  ENDOCRINE: No polyuria, nocturia,  HEMATOLOGY: No anemia, easy bruising or bleeding SKIN: No rash or lesion. MUSCULOSKELETAL: Left hip pain. NEUROLOGIC: No tingling, numbness, weakness.  PSYCHIATRY: No anxiety or depression.   MEDICATIONS AT HOME:   Prior to Admission medications   Medication Sig Start Date End Date Taking? Authorizing Provider  acetaminophen (TYLENOL) 325 MG tablet Take 650 mg by mouth every 4 (four) hours as needed for mild pain, fever or headache.    Yes Historical Provider, MD  aspirin EC 81 MG tablet Take 81 mg by mouth daily.   Yes Historical Provider, MD  atorvastatin (LIPITOR) 40 MG tablet Take 40 mg by mouth at bedtime.   Yes Historical Provider, MD  dextromethorphan-guaiFENesin (MUCINEX DM) 30-600 MG per 12 hr tablet Take 1 tablet by mouth every 12 (twelve) hours as needed for cough.   Yes Historical Provider, MD  divalproex (DEPAKOTE ER) 250 MG 24 hr tablet Take 250 mg by mouth at bedtime. Pt takes with two '500mg'$  tablets.   Yes Historical Provider, MD  divalproex (DEPAKOTE ER) 500 MG  24 hr tablet Take 1,000 mg by mouth at bedtime.   Yes Historical Provider, MD  furosemide (LASIX) 20 MG tablet Take 20 mg by mouth daily as needed for edema.   Yes Historical Provider, MD  gabapentin (NEURONTIN) 600 MG tablet Take 600 mg by mouth at bedtime.   Yes Historical Provider, MD  levothyroxine (SYNTHROID, LEVOTHROID) 75 MCG tablet Take 75 mcg by mouth daily before breakfast.   Yes Historical Provider, MD  loperamide (IMODIUM A-D) 2 MG tablet Take 2 mg by mouth 4 (four) times daily as needed for diarrhea or loose stools.   Yes Historical Provider, MD  metoprolol succinate (TOPROL-XL) 50 MG 24 hr tablet Take 50 mg by mouth daily.     Yes Historical Provider, MD  OLANZapine (ZYPREXA) 20 MG tablet Take 20 mg by mouth at bedtime.   Yes Historical Provider, MD  psyllium (REGULOID) 0.52 G capsule Take 0.52 g by mouth daily.   Yes Historical Provider, MD  Vitamin D, Ergocalciferol, (DRISDOL) 50000 UNITS CAPS capsule Take 50,000 Units by mouth every 30 (thirty) days.   Yes Historical Provider, MD  Vitamins A & D (VITAMIN A & D) ointment Apply 1 application topically 2 (two) times daily as needed (barrier).   Yes Historical Provider, MD      VITAL SIGNS:  Blood pressure 116/74, pulse 86, temperature 98.4 F (36.9 C), temperature source Oral, resp. rate 20, height '5\' 11"'$  (1.803 m), weight 56.7 kg (125 lb), SpO2 100 %.  PHYSICAL EXAMINATION:  GENERAL:  69 y.o.-year-old patient lying in the bed with no acute distress.  EYES: Pupils equal, round, reactive to light and accommodation. No scleral icterus. Extraocular muscles intact.  HEENT: Head atraumatic, normocephalic. Oropharynx and nasopharynx clear.  NECK:  Supple, no jugular venous distention. No thyroid enlargement, no tenderness.  LUNGS: Normal breath sounds bilaterally, no wheezing, rales,rhonchi or crepitation. No use of accessory muscles of respiration.  CARDIOVASCULAR: S1, S2 normal. No murmurs, rubs, or gallops.  ABDOMEN: Soft, nontender, nondistended. Bowel sounds present. No organomegaly or mass.  EXTREMITIES: Bilateral lower extremity edema 1+, no cyanosis, or clubbing.  NEUROLOGIC: Cranial nerves II through XII are intact. Unable to move left lower extremity, which is left rotated. Sensation intact. Gait not checked.  PSYCHIATRIC: The patient is alert and oriented x 3.  SKIN: No obvious rash, lesion, or ulcer.   LABORATORY PANEL:   CBC  Recent Labs Lab 06/06/15 1015  WBC 7.5  HGB 11.5*  HCT 34.0*  PLT 197   ------------------------------------------------------------------------------------------------------------------  Chemistries   Recent Labs Lab  06/06/15 1015  NA 141  K 4.1  CL 108  CO2 28  GLUCOSE 183*  BUN 15  CREATININE 1.09  CALCIUM 8.7*  AST 20  ALT 16*  ALKPHOS 67  BILITOT 0.5   ------------------------------------------------------------------------------------------------------------------  Cardiac Enzymes No results for input(s): TROPONINI in the last 168 hours. ------------------------------------------------------------------------------------------------------------------  RADIOLOGY:  Dg Chest 1 View  06/06/2015   CLINICAL DATA:  Fall while trying to 6 in chair. Lung cancer with radiation. Screening, preop.  EXAM: CHEST  1 VIEW  COMPARISON:  04/23/2015  FINDINGS: Left upper lobe lung nodule again noted peripherally. Interval removal of left pleural pigtail drainage catheter. No pneumothorax. Prior median sternotomy and CABG. Mild cardiomegaly. No effusions. No acute airspace opacities.  IMPRESSION: Left upper lobe nodule. Interval removal of left pleural drainage catheter without pneumothorax.  Cardiomegaly.   Electronically Signed   By: Rolm Baptise M.D.   On: 06/06/2015 11:32   Dg  Hip Unilat With Pelvis 2-3 Views Left  06/06/2015   CLINICAL DATA:  Acute left hip pain after falling this morning at home. Initial encounter.  EXAM: DG HIP (WITH OR WITHOUT PELVIS) 2-3V LEFT  COMPARISON:  None.  FINDINGS: Moderately angulated fracture is seen involving the trochanteric region of the proximal left femur. This appears to be closed and posttraumatic. Femoral head is situated within the acetabulum.  IMPRESSION: Moderately angulated intertrochanteric fracture of proximal left femur.   Electronically Signed   By: Marijo Conception, M.D.   On: 06/06/2015 11:25    EKG:   Orders placed or performed during the hospital encounter of 06/06/15  . ED EKG  . ED EKG    IMPRESSION AND PLAN:   Left hip fracture Hypertension Diabetes CAD Lung cancer  The patient will be admitted to medical floor. The patient has multiple  medical problems, has moderate to high risk for left hip surgery.  I will hold aspirin and no heparin subcutaneous for DVT prophylaxis due to possible surgery. I will keep the patient on nothing by mouth status for possible left hip fracture surgery today. I will request orthopedic surgeon consult.  Continue patient's home medication.  Start sliding scale.   All the records are reviewed and case discussed with ED provider. Management plans discussed with the patient, his wife and son, and they are in agreement.  CODE STATUS: DO NOT RESUSCITATE.  TOTAL TIME TAKING CARE OF THIS PATIENT: 50 minutes.    Demetrios Loll M.D on 06/06/2015 at 12:06 PM  Between 7am to 6pm - Pager - [phone]  After 6pm go to [email or web address] - password [email or web address] [facility]  [facility]  Office  [phone]  CC: Primary care physician; [name], MD

## 2015-06-06 NOTE — ED Notes (Signed)
Patient presents to the ED via EMS from home after falling this morning when he was attempting to sit in a chair.  Patient is complaining of left hip pain.  Family states patient did not hit his head or lose consciousness when he fell.  Patient reports having lung cancer and receiving radiation yesterday.  Patient states he is now in remission from his cancer.  Patient states when he is still he does not have pain when he moves his pain is a 10.  Patient's left foot appears very swollen, family states foot has been swollen for several days.  Patient was given 100 mcg IV  of fentanyl prior to arrival by EMS.

## 2015-06-06 NOTE — ED Provider Notes (Signed)
[facility] Emergency Department Provider Note  ____________________________________________  Time seen: Approximately 9:55 AM  I have reviewed the triage vital signs and the nursing notes.   HISTORY  Chief Complaint Fall    HPI Marcus [name] is a 69 y.o. male with history of bipolar disorder, vascular dementia, diabetes, coronary artery disease, lung cancer who presents for evaluation of traumatic left hip pain which began suddenly just prior to arrival. The patient was trying to back up into his chair and he missed, falling to the ground. He did not hit his head or lose consciousness. Pain improved with fentanyl given by EMS, now 1/10. It is worse with movement of the left hip. He has no other pain complaints. No chest pain, no difficulty breathing, no abdominal pain, no head neck or back pain.   Past Medical History  Diagnosis Date  . Diabetes   . Vascular dementia   . Bipolar 1 disorder   . Stroke 1998  . Heart disease   . CAD (coronary artery disease)   . Hypertension   . Shortness of breath dyspnea   . Hypothyroidism   . Tuberculosis 2015  . Cancer     Patient Active Problem List   Diagnosis Date Noted  . Malignant neoplasm of upper lobe of left lung   . History of chest tube placement   . Pneumothorax after biopsy 04/20/2015    Past Surgical History  Procedure Laterality Date  . Cardiac bypass    . Coronary artery bypass graft    . Appendectomy      Current Outpatient Rx  Name  Route  Sig  Dispense  Refill  . acetaminophen (TYLENOL) 325 MG tablet   Oral   Take 650 mg by mouth every 4 (four) hours as needed for mild pain, fever or headache.          Marland Kitchen aspirin EC 81 MG tablet   Oral   Take 81 mg by mouth daily.         Marland Kitchen atorvastatin (LIPITOR) 40 MG tablet   Oral   Take 40 mg by mouth at bedtime.         Marland Kitchen dextromethorphan-guaiFENesin (MUCINEX DM) 30-600 MG per 12 hr tablet   Oral   Take 1 tablet by mouth every 12  (twelve) hours as needed for cough.         . divalproex (DEPAKOTE ER) 250 MG 24 hr tablet   Oral   Take 250 mg by mouth at bedtime. Pt takes with two '500mg'$  tablets.         . divalproex (DEPAKOTE ER) 500 MG 24 hr tablet   Oral   Take 1,000 mg by mouth at bedtime.         . furosemide (LASIX) 20 MG tablet   Oral   Take 20 mg by mouth daily as needed for edema.         . gabapentin (NEURONTIN) 600 MG tablet   Oral   Take 600 mg by mouth at bedtime.         Marland Kitchen levothyroxine (SYNTHROID, LEVOTHROID) 75 MCG tablet   Oral   Take 75 mcg by mouth daily before breakfast.         . loperamide (IMODIUM A-D) 2 MG tablet   Oral   Take 2 mg by mouth 4 (four) times daily as needed for diarrhea or loose stools.         . metoprolol succinate (TOPROL-XL) 50 MG  24 hr tablet   Oral   Take 50 mg by mouth daily.          Marland Kitchen OLANZapine (ZYPREXA) 20 MG tablet   Oral   Take 20 mg by mouth at bedtime.         . psyllium (REGULOID) 0.52 G capsule   Oral   Take 0.52 g by mouth daily.         . Vitamin D, Ergocalciferol, (DRISDOL) 50000 UNITS CAPS capsule   Oral   Take 50,000 Units by mouth every 30 (thirty) days.         . Vitamins A & D (VITAMIN A & D) ointment   Topical   Apply 1 application topically 2 (two) times daily as needed (barrier).           Allergies Review of patient's allergies indicates no known allergies.  Family History  Problem Relation Age of Onset  . Parkinson's disease Father     Social History Social History  Substance Use Topics  . Smoking status: Former Smoker -- 0.50 packs/day for 58 years    Types: Cigarettes    Quit date: 06/05/2015  . Smokeless tobacco: Former Systems developer    Types: Snuff     Comment: used snuff in [employer]; uses cigarettes and cigars  . Alcohol Use: No    Review of Systems Constitutional: No fever/chills Eyes: No visual changes. ENT: No sore throat. Cardiovascular: Denies chest pain. Respiratory: Denies  shortness of breath. Gastrointestinal: No abdominal pain.  No nausea, no vomiting.  No diarrhea.  No constipation. Genitourinary: Negative for dysuria. Musculoskeletal: Negative for back pain. Skin: Negative for rash. Neurological: Negative for headaches, focal weakness or numbness.  10-point ROS otherwise negative.  ____________________________________________   PHYSICAL EXAM: Filed Vitals:   06/06/15 1002 06/06/15 1124  BP: 109/77 111/67  Pulse: 75 77  Temp: 98.4 F (36.9 C)   TempSrc: Oral   Resp: 17 16  Height: '5\' 11"'$  (1.803 m)   Weight: 125 lb (56.7 kg)   SpO2: 89% 100%      Constitutional: Alert and oriented. Well appearing and in no acute distress. Eyes: Conjunctivae are normal. PERRL. EOMI. Head: Atraumatic. Nose: No congestion/rhinnorhea. Mouth/Throat: Mucous membranes are moist.  Oropharynx non-erythematous. Neck: No stridor.  No cervical spine tenderness to palpation. Cardiovascular: Normal rate, regular rhythm. Grossly normal heart sounds.  Good peripheral circulation. Respiratory: Normal respiratory effort.  No retractions. Lungs CTAB. Gastrointestinal: Soft and nontender. No distention. No abdominal bruits. No CVA tenderness. Genitourinary: deferred Musculoskeletal: Pelvis is stable to rock and compression. He does have tenderness throughout the left groin and limited range of motion at the left hip. 2+ left DP pulse, wiggles the toes of the left foot, pitting edema associated with the top of the left foot.  Neurologic:  Normal speech and language. No gross focal neurologic deficits are appreciated.  Skin:  Skin is warm, dry and intact. No rash noted. Psychiatric: Mood and affect are normal. Speech and behavior are normal.  ____________________________________________   LABS (all labs ordered are listed, but only abnormal results are displayed)  Labs Reviewed  CBC WITH DIFFERENTIAL/PLATELET - Abnormal; Notable for the following:    RBC 3.20 (*)     Hemoglobin 11.5 (*)    HCT 34.0 (*)    MCV 106.1 (*)    MCH 35.9 (*)    RDW 15.0 (*)    All other components within normal limits  COMPREHENSIVE METABOLIC PANEL - Abnormal; Notable for the following:  Glucose, Bld 183 (*)    Calcium 8.7 (*)    Total Protein 6.3 (*)    Albumin 3.0 (*)    ALT 16 (*)    All other components within normal limits  APTT - Abnormal; Notable for the following:    aPTT 23 (*)    All other components within normal limits  PROTIME-INR   ____________________________________________  EKG  ED ECG REPORT I, Joanne Gavel, the attending physician, personally viewed and interpreted this ECG.   Date: 06/06/2015  EKG Time: 09:52  Rate: 74  Rhythm: normal sinus rhythm with sinus arrhythmia  Axis: normal  Intervals:none  ST&T Change: No acute ST elevation. Q waves in lead 2, 3, aVF., Q-wave in V6. EKG compared to prior and 07/2012 and is unchanged  ____________________________________________  RADIOLOGY  Left hip xray  IMPRESSION: Moderately angulated intertrochanteric fracture of proximal left femur.   CXR IMPRESSION: Left upper lobe nodule. Interval removal of left pleural drainage catheter without pneumothorax.  Cardiomegaly.  ____________________________________________   PROCEDURES  Procedure(s) performed: None  Critical Care performed: No  ____________________________________________   INITIAL IMPRESSION / ASSESSMENT AND PLAN / ED COURSE  Pertinent labs & imaging results that were available during my care of the patient were reviewed by me and considered in my medical decision making (see chart for details).  Marcus [name] is a 70 y.o. male with history of bipolar disorder, vascular dementia, diabetes, coronary artery disease, lung cancer who presents for evaluation of traumatic left hip pain which began suddenly just prior to arrival. On exam, he is nontoxic appearing and in no acute distress. Vital signs are stable, he  is afebrile. His exam is atraumatic with the exception of tenderness throughout the left groin. He is neurovascularly intact in the left lower extremity. Plain films confirm intertrochanteric left femur fracture. Labs reviewed and are generally unremarkable with the exception of mild anemia. Case discussed with Dr. Bridgett Larsson, hospitalist for admission. He is discussed with Dr. Sabra Heck of orthopedic surgery who is aware of the patient. ____________________________________________   FINAL CLINICAL IMPRESSION(S) / ED DIAGNOSES  Final diagnoses:  Femur fracture, left, closed, initial encounter      Joanne Gavel, MD 06/06/15 1156

## 2015-06-06 NOTE — Care Management (Signed)
I have notified [facility] of patient's admission. [facility] will assist CSW and RNCM with discharge planning needs.

## 2015-06-07 ENCOUNTER — Inpatient Hospital Stay: Payer: Medicare (Managed Care)

## 2015-06-07 ENCOUNTER — Encounter: Payer: Self-pay | Admitting: Specialist

## 2015-06-07 ENCOUNTER — Inpatient Hospital Stay: Payer: Medicare (Managed Care) | Admitting: Anesthesiology

## 2015-06-07 ENCOUNTER — Encounter
Admission: EM | Disposition: A | Discharge status: Skilled Nursing Facility | Payer: Self-pay | Source: Home / Self Care | Attending: Internal Medicine

## 2015-06-07 HISTORY — PX: COMPRESSION HIP SCREW: SHX1386

## 2015-06-07 LAB — BASIC METABOLIC PANEL
ANION GAP: 6 (ref 5–15)
BUN: 14 mg/dL (ref 6–20)
CALCIUM: 8.7 mg/dL — AB (ref 8.9–10.3)
CO2: 28 mmol/L (ref 22–32)
CREATININE: 1.11 mg/dL (ref 0.61–1.24)
Chloride: 113 mmol/L — ABNORMAL HIGH (ref 101–111)
GFR calc Af Amer: >60 mL/min (ref >60)
GLUCOSE: 165 mg/dL — AB (ref 65–99)
Potassium: 4.5 mmol/L (ref 3.5–5.1)
Sodium: 147 mmol/L — ABNORMAL HIGH (ref 135–145)

## 2015-06-07 LAB — GLUCOSE, CAPILLARY
GLUCOSE-CAPILLARY: 141 mg/dL — AB (ref 65–99)
GLUCOSE-CAPILLARY: 144 mg/dL — AB (ref 65–99)
GLUCOSE-CAPILLARY: 102 mg/dL — AB (ref 65–99)

## 2015-06-07 LAB — CBC
HCT: 31 % — ABNORMAL LOW (ref 40.0–52.0)
Hemoglobin: 10.5 g/dL — ABNORMAL LOW (ref 13.0–18.0)
MCH: 36.2 pg — AB (ref 26.0–34.0)
MCHC: 33.9 g/dL (ref 32.0–36.0)
MCV: 106.8 fL — ABNORMAL HIGH (ref 80.0–100.0)
PLATELETS: 185 10*3/uL (ref 150–440)
RBC: 2.91 MIL/uL — ABNORMAL LOW (ref 4.40–5.90)
RDW: 15.3 % — AB (ref 11.5–14.5)
WBC: 6.5 10*3/uL (ref 3.8–10.6)

## 2015-06-07 LAB — URINALYSIS COMPLETE WITH MICROSCOPIC (ARMC ONLY)
BILIRUBIN URINE: NEGATIVE
Glucose, UA: NEGATIVE mg/dL
KETONES UR: NEGATIVE mg/dL
Nitrite: NEGATIVE
PROTEIN: NEGATIVE mg/dL
SQUAMOUS EPITHELIAL / LPF: NONE SEEN
Specific Gravity, Urine: 1.005 (ref 1.005–1.030)
pH: 7 (ref 5.0–8.0)

## 2015-06-07 LAB — SURGICAL PCR SCREEN
MRSA, PCR: NEGATIVE
STAPHYLOCOCCUS AUREUS: NEGATIVE

## 2015-06-07 LAB — VITAMIN D 25 HYDROXY (VIT D DEFICIENCY, FRACTURES): Vit D, 25-Hydroxy: 54 ng/mL (ref 30.0–100.0)

## 2015-06-07 SURGERY — COMPRESSION HIP
Anesthesia: Monitor Anesthesia Care | Laterality: Left | Wound class: Clean

## 2015-06-07 MED ORDER — ACETAMINOPHEN 650 MG RE SUPP: 650.0000 mg | Freq: Four times a day (QID) | RECTAL | Status: DC | PRN

## 2015-06-07 MED ORDER — FLEET ENEMA 7-19 GM/118ML RE ENEM: 1.0000 | ENEMA | Freq: Once | RECTAL | Status: DC | PRN

## 2015-06-07 MED ORDER — MIDAZOLAM HCL 5 MG/5ML IJ SOLN
INTRAMUSCULAR | Status: DC | PRN
  Administered 2015-06-07: 1 mg via INTRAVENOUS

## 2015-06-07 MED ORDER — BUPIVACAINE-EPINEPHRINE (PF) 0.5% -1:200000 IJ SOLN
INTRAMUSCULAR | Status: AC
  Filled 2015-06-07: qty 30

## 2015-06-07 MED ORDER — PROPOFOL INFUSION 10 MG/ML OPTIME
INTRAVENOUS | Status: DC | PRN
  Administered 2015-06-07: 25 ug/kg/min via INTRAVENOUS

## 2015-06-07 MED ORDER — CEFAZOLIN SODIUM-DEXTROSE 2-3 GM-% IV SOLR
2.0000 g | Freq: Four times a day (QID) | INTRAVENOUS | Status: AC
  Administered 2015-06-08 (×2): 2 g via INTRAVENOUS
  Filled 2015-06-07 (×2): qty 50

## 2015-06-07 MED ORDER — OXYCODONE HCL 5 MG PO TABS: 5.0000 mg | ORAL_TABLET | Freq: Once | ORAL | Status: DC | PRN

## 2015-06-07 MED ORDER — KETAMINE HCL 50 MG/ML IJ SOLN
INTRAMUSCULAR | Status: DC | PRN
  Administered 2015-06-07: 25 mg via INTRAMUSCULAR

## 2015-06-07 MED ORDER — FENTANYL CITRATE (PF) 100 MCG/2ML IJ SOLN: 25.0000 ug | INTRAMUSCULAR | Status: DC | PRN

## 2015-06-07 MED ORDER — SODIUM CHLORIDE 0.45 % IV SOLN
INTRAVENOUS | Status: DC
Start: 2015-06-07 — End: 2015-06-08
  Administered 2015-06-07: 20:00:00 via INTRAVENOUS

## 2015-06-07 MED ORDER — CLINDAMYCIN PHOSPHATE 600 MG/50ML IV SOLN
600.0000 mg | Freq: Three times a day (TID) | INTRAVENOUS | Status: AC
  Administered 2015-06-08 (×3): 600 mg via INTRAVENOUS
  Filled 2015-06-07 (×4): qty 50

## 2015-06-07 MED ORDER — SODIUM CHLORIDE 0.9 % IV SOLN
10000.0000 ug | INTRAVENOUS | Status: DC | PRN
  Administered 2015-06-07: 15 ug/min via INTRAVENOUS

## 2015-06-07 MED ORDER — PHENYLEPHRINE HCL 10 MG/ML IJ SOLN
INTRAMUSCULAR | Status: DC | PRN
  Administered 2015-06-07 (×3): 150 ug via INTRAVENOUS

## 2015-06-07 MED ORDER — OXYCODONE HCL 5 MG/5ML PO SOLN: 5.0000 mg | Freq: Once | ORAL | Status: DC | PRN

## 2015-06-07 MED ORDER — NEOMYCIN-POLYMYXIN B GU 40-200000 IR SOLN
Status: AC
  Filled 2015-06-07: qty 4

## 2015-06-07 MED ORDER — ENOXAPARIN SODIUM 30 MG/0.3ML ~~LOC~~ SOLN
30.0000 mg | Freq: Two times a day (BID) | SUBCUTANEOUS | Status: DC
  Administered 2015-06-08 – 2015-06-09 (×3): 30 mg via SUBCUTANEOUS
  Filled 2015-06-07 (×3): qty 0.3

## 2015-06-07 MED ORDER — FERROUS SULFATE 325 (65 FE) MG PO TABS
325.0000 mg | ORAL_TABLET | Freq: Every day | ORAL | Status: DC
  Administered 2015-06-08 – 2015-06-09 (×2): 325 mg via ORAL
  Filled 2015-06-07: qty 1

## 2015-06-07 MED ORDER — BUPIVACAINE-EPINEPHRINE (PF) 0.25% -1:200000 IJ SOLN
INTRAMUSCULAR | Status: AC
  Filled 2015-06-07: qty 30

## 2015-06-07 MED ORDER — BUPIVACAINE HCL (PF) 0.5 % IJ SOLN
INTRAMUSCULAR | Status: DC | PRN
  Administered 2015-06-07: 2 mL

## 2015-06-07 MED ORDER — NEOMYCIN-POLYMYXIN B GU 40-200000 IR SOLN
Status: DC | PRN
  Administered 2015-06-07: 4 mL

## 2015-06-07 MED ORDER — ALUM & MAG HYDROXIDE-SIMETH 200-200-20 MG/5ML PO SUSP: 30.0000 mL | ORAL | Status: DC | PRN

## 2015-06-07 MED ORDER — PHENOL 1.4 % MT LIQD: 1.0000 | OROMUCOSAL | Status: DC | PRN

## 2015-06-07 MED ORDER — MENTHOL 3 MG MT LOZG
1.0000 | LOZENGE | OROMUCOSAL | Status: DC | PRN
  Filled 2015-06-07: qty 9

## 2015-06-07 MED ORDER — ACETAMINOPHEN 500 MG PO TABS
1000.0000 mg | ORAL_TABLET | Freq: Four times a day (QID) | ORAL | Status: AC
  Administered 2015-06-07 – 2015-06-08 (×2): 1000 mg via ORAL
  Filled 2015-06-07 (×4): qty 2

## 2015-06-07 MED ORDER — BUPIVACAINE-EPINEPHRINE 0.25% -1:200000 IJ SOLN
INTRAMUSCULAR | Status: DC | PRN
  Administered 2015-06-07: 30 mL

## 2015-06-07 MED ORDER — MAGNESIUM HYDROXIDE 400 MG/5ML PO SUSP
30.0000 mL | Freq: Every day | ORAL | Status: DC | PRN
  Administered 2015-06-09: 30 mL via ORAL
  Filled 2015-06-07: qty 30

## 2015-06-07 MED ORDER — ACETAMINOPHEN 325 MG PO TABS
650.0000 mg | ORAL_TABLET | Freq: Four times a day (QID) | ORAL | Status: DC | PRN
  Administered 2015-06-08 – 2015-06-09 (×2): 650 mg via ORAL
  Filled 2015-06-07 (×2): qty 2

## 2015-06-07 MED ORDER — METOCLOPRAMIDE HCL 5 MG PO TABS: 5.0000 mg | ORAL_TABLET | Freq: Three times a day (TID) | ORAL | Status: DC | PRN

## 2015-06-07 MED ORDER — METOCLOPRAMIDE HCL 5 MG/ML IJ SOLN: 5.0000 mg | Freq: Three times a day (TID) | INTRAMUSCULAR | Status: DC | PRN

## 2015-06-07 MED ORDER — LACTATED RINGERS IV SOLN
INTRAVENOUS | Status: DC | PRN
  Administered 2015-06-07: 17:00:00 via INTRAVENOUS

## 2015-06-07 MED ORDER — DEXTROSE-NACL 5-0.45 % IV SOLN
INTRAVENOUS | Status: DC
  Administered 2015-06-07: 13:00:00 via INTRAVENOUS

## 2015-06-07 SURGICAL SUPPLY — 45 items
BAG COUNTER SPONGE EZ (MISCELLANEOUS) IMPLANT
BIT DRILL 100X2XQC STRL (BIT) ×1 IMPLANT
BIT DRILL GRAY 5X250 (MISCELLANEOUS) ×3 IMPLANT
BIT DRILL QC 2.0X100 (BIT) ×2
BIT DRL 100X2XQC STRL (BIT) ×1
CANISTER SUCT 1200ML W/VALVE (MISCELLANEOUS) ×3 IMPLANT
CANNULA 5.75X7 CRYSTAL CLEAR (CANNULA) IMPLANT
CHLORAPREP W/TINT 26ML (MISCELLANEOUS) ×6 IMPLANT
COUNTER SPONGE BAG EZ (MISCELLANEOUS)
DRSG AQUACEL AG ADV 3.5X10 (GAUZE/BANDAGES/DRESSINGS) ×3 IMPLANT
GAUZE PETRO XEROFOAM 1X8 (MISCELLANEOUS) ×3 IMPLANT
GAUZE SPONGE 4X4 12PLY STRL (GAUZE/BANDAGES/DRESSINGS) ×3 IMPLANT
GLOVE BIO SURGEON STRL SZ 6.5 (GLOVE) ×2 IMPLANT
GLOVE BIO SURGEONS STRL SZ 6.5 (GLOVE) ×1
GLOVE BIOGEL PI IND STRL 7.0 (GLOVE) ×1 IMPLANT
GLOVE BIOGEL PI INDICATOR 7.0 (GLOVE) ×2
GLOVE INDICATOR 8.0 STRL GRN (GLOVE) ×3 IMPLANT
GLOVE SURG ORTHO 8.5 STRL (GLOVE) ×6 IMPLANT
GOWN STRL REUS W/ TWL LRG LVL3 (GOWN DISPOSABLE) ×2 IMPLANT
GOWN STRL REUS W/TWL LRG LVL3 (GOWN DISPOSABLE) ×4
HEMOVAC 400CC 10FR (MISCELLANEOUS) IMPLANT
K-WIRE 2.0X150M (WIRE) ×3
KIT RM TURNOVER STRD PROC AR (KITS) ×3 IMPLANT
KWIRE 2.0X150M (WIRE) ×1 IMPLANT
MAT BLUE FLOOR 46X72 FLO (MISCELLANEOUS) ×3 IMPLANT
NEEDLE FILTER BLUNT 18X 1/2SAF (NEEDLE) ×2
NEEDLE FILTER BLUNT 18X1 1/2 (NEEDLE) ×1 IMPLANT
NEEDLE HYPO 18GX1.5 BLUNT FILL (NEEDLE) IMPLANT
NEEDLE SPNL 18GX3.5 QUINCKE PK (NEEDLE) ×6 IMPLANT
PACK HIP COMPR (MISCELLANEOUS) ×3 IMPLANT
PAD GROUND ADULT SPLIT (MISCELLANEOUS) ×3 IMPLANT
PENCIL ELECTRO HAND CTR (MISCELLANEOUS) ×3 IMPLANT
PLATE DHS 130 STD 4H 78 (Plate) ×3 IMPLANT
SCREW CORTEX ST 4.5X40 (Screw) ×6 IMPLANT
SCREW CORTEX ST 4.5X42 (Screw) ×6 IMPLANT
SCREW DHS LAG 85MM (Screw) ×3 IMPLANT
SOL PREP PVP 2OZ (MISCELLANEOUS) ×3
SOLUTION PREP PVP 2OZ (MISCELLANEOUS) ×1 IMPLANT
STAPLER SKIN PROX 35W (STAPLE) ×3 IMPLANT
SUCTION FRAZIER TIP 10 FR DISP (SUCTIONS) ×3 IMPLANT
SUT QUILL PDO 0 36 36 VIOLET (SUTURE) ×3 IMPLANT
SUT QUILL PDO 2 24X24 VLT (SUTURE) ×3 IMPLANT
SYR 30ML LL (SYRINGE) ×3 IMPLANT
SYRINGE 10CC LL (SYRINGE) ×3 IMPLANT
TAPE MICROFOAM 4IN (TAPE) ×3 IMPLANT

## 2015-06-07 NOTE — Progress Notes (Signed)
Initial Nutrition Assessment   INTERVENTION:   Meals and Snacks: Cater to patient preferences once diet order able to be advanced Medical Food Supplement Therapy: will recommend Ensure Enlive po TID, each supplement provides 350 kcal and 20 grams of protein, once diet able to be advanced as pt drinks PTA   NUTRITION DIAGNOSIS:   Inadequate oral intake related to inability to eat as evidenced by NPO status.  GOAL:   Patient will meet greater than or equal to 90% of their needs  MONITOR:    (Energy Intake, Glucose Profile, Anthropometrics, Digestive system, Pulmonary Profile)  REASON FOR ASSESSMENT:   Malnutrition Screening Tool    ASSESSMENT:   Pt admitted s/p fall with left hip fracture, scheduled for left ORIF 06/07/2015. Pt with h/o lung cancer recently completing radiation per Nsg.   Past Medical History  Diagnosis Date  . Diabetes   . Vascular dementia   . Bipolar 1 disorder   . Stroke 1998  . Heart disease   . CAD (coronary artery disease)   . Hypertension   . Shortness of breath dyspnea   . Hypothyroidism   . Tuberculosis 2015  . Cancer     Diet Order:  Diet NPO time specified    Current Nutrition: Pt ate 50% of breakfast this am per RN Estill Bamberg. Recorded po intake 100% of dinner last night.  Food/Nutrition-Related History: Pt family present on visit, reports that pt had a really good appetite PTA eating 3 meals per day plus Ensure supplements and another supplement in a box that they could not remember the name.   Medications: NS at 62m/hr, senokot, Novolog, Colace  Electrolyte/Renal Profile and Glucose Profile:   Recent Labs Lab 06/06/15 1015 06/06/15 1332 06/07/15 0452  NA 141 145 147*  K 4.1 4.4 4.5  CL 108 109 113*  CO2 '28 30 28  '$ BUN '15 15 14  '$ CREATININE 1.09 1.00 1.11  CALCIUM 8.7* 8.8* 8.7*  GLUCOSE 183* 80 165*   Protein Profile:   Recent Labs Lab 06/06/15 1015 06/06/15 1332  ALBUMIN 3.0* 3.0*    Gastrointestinal  Profile: Last BM:  06/05/2015   Nutrition-Focused Physical Exam Findings:  Unable to complete Nutrition-Focused physical exam at this time.    Weight Change: Pt family reports pt weight has been stable recently around 125lbs but that his doctors and care takers 'want him to gain more.'   Skin:  Reviewed, no issues  Height:   Ht Readings from Last 1 Encounters:  06/06/15 '5\' 11"'$  (1.803 m)    Weight:   Wt Readings from Last 1 Encounters:  06/06/15 125 lb (56.7 kg)    Wt Readings from Last 10 Encounters:  06/06/15 125 lb (56.7 kg)  05/03/15 126 lb 5.2 oz (57.3 kg)  04/27/15 127 lb 13.9 oz (58 kg)  04/20/15 137 lb (62.143 kg)  04/13/15 128 lb 6.7 oz (58.25 kg)    Ideal Body Weight:   78kg  BMI:  Body mass index is 17.44 kg/(m^2).  Estimated Nutritional Needs:   Kcal:  using IBW of 78kg, BEE: 1567kcals, TEE: (IF 1.1-1.3)(AF 1.2) [address]KGMWN Protein:  78-94g protein (1.0-1.2g/kg)   Fluid:  1950-23479mof fluid (25-3023mg)   EDUCATION NEEDS:   No education needs identified at this time   MODMaple Heights-Lake DesireD, LDN Pager [phone]

## 2015-06-07 NOTE — Care Management Note (Signed)
Case Management Note  Patient Details  Name: Marcus [name] MRN: [id number] Date of Birth: 23-Oct-1945  Subjective/Objective:                   [facility] is open to [facility] which will assist with discharge planning needs; [facility] is aware of patient admission. Patient pending surgery today. I met with patient to discuss discharge planning. He states he stays at home with his wife and adult son which later visited patient after I left room. He states he usually uses a walker to ambulate. He states he [facility] to [facility] on Monday's and Wednesdays. He has lung cancer and being treated with radiation. He ate this AM so surgery may be delayed. O2 is new to this patient. RN to assess need at discharge especially if patient can return home. [facility] will provide therapy in the home; RNCM to arrange O2 and they usually use [facility] for O2.  Action/Plan:  RNCM to continue to follow.   Expected Discharge Date:                  Expected Discharge Plan:     In-House Referral:  Clinical Social Work  Discharge planning Services  CM Consult  Post Acute Care Choice:    Choice offered to:  Patient  DME Arranged:    DME Agency:     HH Arranged:    [place] Agency:     Status of Service:  In process, will continue to follow  Medicare Important Message Given:    Date Medicare IM Given:    Medicare IM give by:    Date Additional Medicare IM Given:    Additional Medicare Important Message give by:     If discussed at [place] of Stay Meetings, dates discussed:    Additional Comments:  Marshell Garfinkel, RN 06/07/2015, 10:01 AM

## 2015-06-07 NOTE — Progress Notes (Signed)
Patient NPO since midnight. Dining sent up a breakfast tray, patient ate half. Dr. Sabra Heck notified of incident. States he will notify anesthesiologist.

## 2015-06-07 NOTE — Progress Notes (Signed)
Plan of care discussed with patient. Patient states that he would like to sleep and not be awaking for pain medication. Patient denies pain. Patient resting in bed between care. No acute distress noted. Call bell within reach.

## 2015-06-07 NOTE — Progress Notes (Signed)
[place] [facility] [place] NAME: Marcus [name]    MR#:  [id number]  DATE OF BIRTH:  1946/07/07  SUBJECTIVE:  CHIEF COMPLAINT:   Chief Complaint  Patient presents with  . Fall   patient is 69 year old gentleman with history of lung cancer who is undergoing radiation therapy and is followed by [facility] presents to the hospital after fall. On arrival to emergency room, he was noted to have left hip fracture and was being planned for operating therapy, although he ate breakfast today and the operation was canceled.   Review of Systems  Constitutional: Negative for fever, chills and weight loss.  HENT: Negative for congestion.   Eyes: Negative for blurred vision and double vision.  Respiratory: Negative for cough, sputum production, shortness of breath and wheezing.   Cardiovascular: Negative for chest pain, palpitations, orthopnea, leg swelling and PND.  Gastrointestinal: Negative for nausea, vomiting, abdominal pain, diarrhea, constipation and blood in stool.  Genitourinary: Negative for dysuria, urgency, frequency and hematuria.  Musculoskeletal: Negative for falls.  Neurological: Negative for dizziness, tremors, focal weakness and headaches.  Endo/Heme/Allergies: Does not bruise/bleed easily.  Psychiatric/Behavioral: Negative for depression. The patient does not have insomnia.     VITAL SIGNS: Blood pressure 110/62, pulse 54, temperature 98.9 F (37.2 C), temperature source Oral, resp. rate 16, height '5\' 11"'$  (1.803 m), weight 56.7 kg (125 lb), SpO2 96 %.  PHYSICAL EXAMINATION:   GENERAL:  69 y.o.-year-old patient lying in the bed with no acute distress. Pale but comfortable, eating his breakfast EYES: Pupils equal, round, reactive to light and accommodation. No scleral icterus. Extraocular muscles intact.  HEENT: Head atraumatic, normocephalic. Oropharynx and nasopharynx clear.  NECK:  Supple, no jugular venous distention. No  thyroid enlargement, no tenderness.  LUNGS: Normal breath sounds bilaterally, no wheezing, rales,rhonchi or crepitation. No use of accessory muscles of respiration.  CARDIOVASCULAR: S1, S2 normal. No murmurs, rubs, or gallops.  ABDOMEN: Soft, nontender, nondistended. Bowel sounds present. No organomegaly or mass.  EXTREMITIES: No pedal edema, cyanosis, or clubbing. Left lower extremity is flexed in the hip and knee and rotated inwards.  NEUROLOGIC: Cranial nerves II through XII are intact. Muscle strength 5/5 in all extremities. Sensation intact. Gait not checked.  PSYCHIATRIC: The patient is alert and oriented x 3.  SKIN: No obvious rash, lesion, or ulcer.   ORDERS/RESULTS REVIEWED:   CBC  Recent Labs Lab 06/06/15 1015 06/06/15 1332 06/07/15 0452  WBC 7.5 6.3 6.5  HGB 11.5* 11.1* 10.5*  HCT 34.0* 34.0* 31.0*  PLT 197 204 185  MCV 106.1* 106.9* 106.8*  MCH 35.9* 34.8* 36.2*  MCHC 33.8 32.5 33.9  RDW 15.0* 15.2* 15.3*  LYMPHSABS 1.0 1.3  --   MONOABS 0.9 0.8  --   EOSABS 0.1 0.1  --   BASOSABS 0.0 0.0  --    ------------------------------------------------------------------------------------------------------------------  Chemistries   Recent Labs Lab 06/06/15 1015 06/06/15 1332 06/07/15 0452  NA 141 145 147*  K 4.1 4.4 4.5  CL 108 109 113*  CO2 '28 30 28  '$ GLUCOSE 183* 80 165*  BUN '15 15 14  '$ CREATININE 1.09 1.00 1.11  CALCIUM 8.7* 8.8* 8.7*  AST 20 16  --   ALT 16* 15*  --   ALKPHOS 67 69  --   BILITOT 0.5 0.6  --    ------------------------------------------------------------------------------------------------------------------ estimated creatinine clearance is 51.1 mL/min (by C-G formula based on Cr of 1.11). ------------------------------------------------------------------------------------------------------------------ No results for input(s): TSH, T4TOTAL, T3FREE,  THYROIDAB in the last 72 hours.  Invalid input(s): FREET3  Cardiac Enzymes No results  for input(s): CKMB, TROPONINI, MYOGLOBIN in the last 168 hours.  Invalid input(s): CK ------------------------------------------------------------------------------------------------------------------ Invalid input(s): POCBNP ---------------------------------------------------------------------------------------------------------------  RADIOLOGY: Dg Chest 1 View  06/06/2015   CLINICAL DATA:  Fall while trying to 6 in chair. Lung cancer with radiation. Screening, preop.  EXAM: CHEST  1 VIEW  COMPARISON:  04/23/2015  FINDINGS: Left upper lobe lung nodule again noted peripherally. Interval removal of left pleural pigtail drainage catheter. No pneumothorax. Prior median sternotomy and CABG. Mild cardiomegaly. No effusions. No acute airspace opacities.  IMPRESSION: Left upper lobe nodule. Interval removal of left pleural drainage catheter without pneumothorax.  Cardiomegaly.   Electronically Signed   By: Rolm Baptise M.D.   On: 06/06/2015 11:32   Dg Hip Unilat With Pelvis 2-3 Views Left  06/06/2015   CLINICAL DATA:  Acute left hip pain after falling this morning at home. Initial encounter.  EXAM: DG HIP (WITH OR WITHOUT PELVIS) 2-3V LEFT  COMPARISON:  None.  FINDINGS: Moderately angulated fracture is seen involving the trochanteric region of the proximal left femur. This appears to be closed and posttraumatic. Femoral head is situated within the acetabulum.  IMPRESSION: Moderately angulated intertrochanteric fracture of proximal left femur.   Electronically Signed   By: Marijo Conception, M.D.   On: 06/06/2015 11:25    EKG:  Orders placed or performed during the hospital encounter of 06/06/15  . ED EKG  . ED EKG  . EKG 12-Lead  . EKG 12-Lead    ASSESSMENT AND PLAN:  Principal Problem:   Closed left hip fracture  1. close left hip fracture. Initial encounter, continue pain management, orthopedist surgeon is planning open reduction internal fixation of left hip, likely tomorrow morning. Surgery  today, was canceled due to patient eating breakfast 2.  Fever, etiology is unclear, get UA, chest x-ray was unremarkable except of left upper lobe nodule. No pneumonia noted.  3. Hypernatremia. Initiate patient on D5W, follow sodium level tomorrow morning 4. Anemia, followed with hydration and in the perioperative period , seems to be stable at present   Management plans discussed with the patient, family and they are in agreement.   DRUG ALLERGIES: No Known Allergies  CODE STATUS:     Code Status Orders        Start     Ordered   06/06/15 1258  Do not attempt resuscitation (DNR)   Continuous    Question Answer Comment  In the event of cardiac or respiratory ARREST Do not call a "code blue"   In the event of cardiac or respiratory ARREST Do not perform Intubation, CPR, defibrillation or ACLS   In the event of cardiac or respiratory ARREST Use medication by any route, position, wound care, and other measures to relive pain and suffering. May use oxygen, suction and manual treatment of airway obstruction as needed for comfort.      06/06/15 1257    Advance Directive Documentation        Most Recent Value   Type of Advance Directive  Out of facility DNR (pink MOST or yellow form)   Pre-existing out of facility DNR order (yellow form or pink MOST form)  Physician notified to receive inpatient order   "MOST" Form in Place?        TOTAL TIME TAKING CARE OF THIS PATIENT: 40 minutes.    Theodoro Grist M.D on 06/07/2015 at 12:13 PM  Between 7am to  6pm - Pager - [phone]  After 6pm go to [email or web address] - password [email or web address]  [facility]  Office  [phone]  CC: Primary care physician; [name], MD

## 2015-06-07 NOTE — Anesthesia Preprocedure Evaluation (Addendum)
Anesthesia Evaluation  Patient identified by MRN, date of birth, ID band Patient awake    Reviewed: Allergy & Precautions, H&P , NPO status , Patient's Chart, lab work & pertinent test results  History of Anesthesia Complications Negative for: history of anesthetic complications  Airway Mallampati: II  TM Distance: >3 FB Neck ROM: full    Dental  (+) Poor Dentition, Missing, Upper Dentures, Lower Dentures   Pulmonary shortness of breath, former smoker,    Pulmonary exam normal breath sounds clear to auscultation       Cardiovascular Exercise Tolerance: Poor hypertension, + CAD and + CABG  Normal cardiovascular exam+ Valvular Problems/Murmurs  Rhythm:regular Rate:Normal     Neuro/Psych PSYCHIATRIC DISORDERS Bipolar Disorder TIACVA, No Residual Symptoms    GI/Hepatic negative GI ROS, Neg liver ROS, neg GERD  ,  Endo/Other  diabetes, Type 2, Oral Hypoglycemic AgentsHypothyroidism   Renal/GU negative Renal ROS  negative genitourinary   Musculoskeletal   Abdominal   Peds  Hematology negative hematology ROS (+)   Anesthesia Other Findings Past Medical History:   Diabetes                                                     Vascular dementia                                            Bipolar 1 disorder                                           Stroke                                          1998         Heart disease                                                CAD (coronary artery disease)                                Hypertension                                                 Shortness of breath dyspnea                                  Hypothyroidism                                               Tuberculosis  2015         Cancer                                                       Reproductive/Obstetrics negative OB ROS                            Anesthesia Physical Anesthesia Plan  ASA: IV  Anesthesia Plan: General ETT, MAC and Spinal   Post-op Pain Management:    Induction:   Airway Management Planned:   Additional Equipment:   Intra-op Plan:   Post-operative Plan:   Informed Consent: I have reviewed the patients History and Physical, chart, labs and discussed the procedure including the risks, benefits and alternatives for the proposed anesthesia with the patient or authorized representative who has indicated his/her understanding and acceptance.   Dental Advisory Given  Plan Discussed with: Anesthesiologist, CRNA and Surgeon  Anesthesia Plan Comments: (Patient and family informed that patient is higher risk for complications from anesthesia during this procedure due to their medical history.  Patient and family voiced understanding.  DNR suspended for procedure per family and patient.)        Anesthesia Quick Evaluation

## 2015-06-07 NOTE — Anesthesia Postprocedure Evaluation (Signed)
  Anesthesia Post-op Note  Patient: Marcus [name]  Procedure(s) Performed: Procedure(s): left hip nailing with DHS (Left)  Anesthesia type:General ETT, MAC, Spinal  Patient location: PACU  Post pain: Pain level controlled  Post assessment: Post-op Vital signs reviewed, Patient's Cardiovascular Status Stable, Respiratory Function Stable, Patent Airway and No signs of Nausea or vomiting  Post vital signs: Reviewed and stable  Last Vitals:  Filed Vitals:   06/07/15 1922  BP:   Pulse:   Temp: 37.3 C  Resp:     Level of consciousness: awake, alert  and patient cooperative  Complications: No apparent anesthesia complications

## 2015-06-07 NOTE — Progress Notes (Signed)
Patient admitted from home where he resides with his wife- per PACE RN, Thresa Ross, patient is followed by the PACE Team at home. Per PACE RN, it is likely patient will need a short SNF stay at [facility] before returning home. CSW has placed 30 day note on chart for MD to sign to aide in getting him placed in SNF ([facility]). CSW will complete FL2 tomorrow for probable SNF placement. Full assessment to follow.   Eduard Clos, MSW, [place]

## 2015-06-07 NOTE — Op Note (Signed)
06/06/2015 - 06/07/2015  6:52 PM  PATIENT:  Marcus [name]    PRE-OPERATIVE DIAGNOSIS:  FEMUR FX LEFT, BASE NECK  POST-OPERATIVE DIAGNOSIS:  Same  PROCEDURE:  left hip nailing with SYNTHES DHS PLATE AND SCREW  SURGEON:  Park Breed, MD  ANESTHESIA:  SPINAL  PREOPERATIVE INDICATIONS:  Marcus [name] is a  69 y.o. male with a diagnosis of FEMUR FX LEFT BASE NECK  and elected for surgical management.    The risks benefits and alternatives were discussed with the patient preoperatively including but not limited to the risks of infection, bleeding, nerve injury, cardiopulmonary complications, the need for revision surgery, among others, and the patient was willing to proceed.  EBL: 100 cc  TOURNIQUET TIME: None  OPERATIVE IMPLANTS: 85 mm DHS screw, 130/4-hole plate, 4 cortical screws  OPERATIVE FINDINGS: The patient had a displaced base of the neck fracture of the left hip.  It reduced easily into excellent position and alignment.  OPERATIVE PROCEDURE: The patient was brought to the operating room and underwent satisfactory spinal anesthesia, and was then placed on the operating room table in the supine position.  The right leg was flexed and abducted and the operative leg was placed under traction and internally rotated, and placed in the traction device.  Fluoroscopy showed satisfactory positioning of the fracture fragments.  The operative site was infiltrated with quarter percent Sensorcaine with epinephrine.  IV antibiotics were given.  The hip was prepped and draped in a sterile fashion.  A longitudinal incision was made starting at the base of the trochanter extending distally.  This was deepened through subcutaneous tissue and fascia.  The muscle was split bluntly  exposing the lateral cortex of the femur.  A quarter inch drill hole was made in the lateral cortex and a guide pin placed by hand.  This was adjusted under fluoroscopy into good position in the head and neck.  The  step cut reamer was then advanced to the appropriate depth.  The tap was then introduced and removed.  The above DHS screw was then introduced along with the designated plate.  Traction was released and the plate was impacted.  It was then fixed to the lateral shaft of the femur with cortical screws.  Fluoroscopy showed good final positioning of the fracture fragments and hardware.  After irrigation, the fascia was closed with #2 Quill suture.  The subcutaneous tissue was closed with 0 Quill.  Skin was closed with staples.  An Aquacel dressing was applied.  Sponge and needle count was correct.  Patient was taken out of traction and transferred to the hospital bed in good condition.  Park Breed, MD

## 2015-06-07 NOTE — H&P (Signed)
THE PATIENT WAS SEEN IN THE HOLDING AREA.  HISTORY, ALLERGIES, HOME MEDICATIONS AND OPERATIVE PROCEDURE WERE REVIEWED. RISKS AND BENEFITS OF SURGERY DISCUSSED WITH PATIENT AGAIN.  NO CHANGES FROM INITIAL HISTORY AND PHYSICAL NOTED.    

## 2015-06-07 NOTE — Transfer of Care (Signed)
Immediate Anesthesia Transfer of Care Note  Patient: Marcus [name]  Procedure(s) Performed: Procedure(s): left hip nailing with DHS (Left)  Patient Location: PACU  Anesthesia Type:Spinal  Level of Consciousness: awake  Airway & Oxygen Therapy: Patient Spontanous Breathing and Patient connected to face mask oxygen  Post-op Assessment: Report given to RN and Post -op Vital signs reviewed and stable  Post vital signs: Reviewed and stable  Last Vitals:  Filed Vitals:   06/07/15 1832  BP: 98/51  Pulse: 79  Temp: 36.7 C  Resp: 14    Complications: No apparent anesthesia complications

## 2015-06-07 NOTE — Anesthesia Procedure Notes (Signed)
Spinal Patient location during procedure: OR Start time: 06/07/2015 5:00 PM End time: 06/07/2015 5:06 PM Staffing Anesthesiologist: Katy Fitch K Performed by: anesthesiologist  Preanesthetic Checklist Completed: patient identified, site marked, surgical consent, pre-op evaluation, timeout performed, IV checked, risks and benefits discussed and monitors and equipment checked Spinal Block Patient position: right lateral decubitus Prep: Betadine Patient monitoring: heart rate, continuous pulse ox, blood pressure and cardiac monitor Approach: midline Location: L4-5 Injection technique: single-shot Needle Needle type: Whitacre and Introducer  Needle gauge: 24 G Needle length: 9 cm Assessment Sensory level: T12 Additional Notes Negative paresthesia. Negative blood return. Positive free-flowing CSF. Expiration date of kit checked and confirmed. Patient tolerated procedure well, without complications.

## 2015-06-08 LAB — CBC
HCT: 29.9 % — ABNORMAL LOW (ref 40.0–52.0)
HEMOGLOBIN: 10 g/dL — AB (ref 13.0–18.0)
MCH: 35.4 pg — AB (ref 26.0–34.0)
MCHC: 33.4 g/dL (ref 32.0–36.0)
MCV: 105.9 fL — AB (ref 80.0–100.0)
Platelets: 170 10*3/uL (ref 150–440)
RBC: 2.82 MIL/uL — AB (ref 4.40–5.90)
RDW: 15.1 % — ABNORMAL HIGH (ref 11.5–14.5)
WBC: 5.4 10*3/uL (ref 3.8–10.6)

## 2015-06-08 LAB — BASIC METABOLIC PANEL
Anion gap: 5 (ref 5–15)
BUN: 15 mg/dL (ref 6–20)
CHLORIDE: 113 mmol/L — AB (ref 101–111)
CO2: 30 mmol/L (ref 22–32)
Calcium: 8.9 mg/dL (ref 8.9–10.3)
Creatinine, Ser: 1.02 mg/dL (ref 0.61–1.24)
GFR calc Af Amer: >60 mL/min (ref >60)
GFR calc non Af Amer: >60 mL/min (ref >60)
GLUCOSE: 116 mg/dL — AB (ref 65–99)
POTASSIUM: 4.3 mmol/L (ref 3.5–5.1)
Sodium: 148 mmol/L — ABNORMAL HIGH (ref 135–145)

## 2015-06-08 LAB — GLUCOSE, CAPILLARY
GLUCOSE-CAPILLARY: 97 mg/dL (ref 65–99)
Glucose-Capillary: 115 mg/dL — ABNORMAL HIGH (ref 65–99)
Glucose-Capillary: 280 mg/dL — ABNORMAL HIGH (ref 65–99)
Glucose-Capillary: 195 mg/dL — ABNORMAL HIGH (ref 65–99)

## 2015-06-08 LAB — SODIUM: Sodium: 143 mmol/L (ref 135–145)

## 2015-06-08 MED ORDER — DEXTROSE 5 % IV SOLN
INTRAVENOUS | Status: DC
  Administered 2015-06-08: 08:00:00 via INTRAVENOUS

## 2015-06-08 MED ORDER — ENSURE ENLIVE PO LIQD
237.0000 mL | Freq: Two times a day (BID) | ORAL | Status: DC
  Administered 2015-06-08 – 2015-06-09 (×4): 237 mL via ORAL

## 2015-06-08 NOTE — Progress Notes (Signed)
Physical Therapy Treatment Patient Details Name: Marcus [name] MRN: [id number] DOB: [birth year] Today's Date: 06/08/2015    History of Present Illness Pt sufferred a mechanical fall and sufferred a L hip fracture. Pt underwent L hip nailing and is POD#1 at time of evaluation without reported post-op complications    PT Comments    Pt continues to demonstrate severe LE weakness and buckling during transfers and limited ambulation from chair to bed. Poor safety awareness and heavy cues required. Pt is very tremulous during PM session today. Pt will benefit from skilled PT services to address deficits in strength, balance, and mobility in order to return to full function at home.    Follow Up Recommendations  SNF     Equipment Recommendations  None recommended by PT    Recommendations for Other Services       Precautions / Restrictions Precautions Precautions: None Restrictions Weight Bearing Restrictions: Yes LLE Weight Bearing: Partial weight bearing LLE Partial Weight Bearing Percentage or Pounds: 25-50%    Mobility  Bed Mobility Overal bed mobility: +2 for physical assistance;Needs Assistance Bed Mobility: Sit to Supine              Transfers Overall transfer level: Needs assistance Equipment used: Rolling walker (2 wheeled) Transfers: Sit to/from Stand Sit to Stand: Min assist;+2 physical assistance         General transfer comment: Pt demonstrates improved strength with transfers this afternoon. Cues for safe hand placement and positioning prior to transfer provided. Pt reports increase in pain and limits WB on LLE.  Ambulation/Gait Ambulation/Gait assistance: Max assist;+2 physical assistance Ambulation Distance (Feet): 3 Feet Assistive device: Rolling walker (2 wheeled) Gait Pattern/deviations: Step-to pattern;Decreased step length - left;Decreased stance time - right;Decreased weight shift to right;Shuffle;Antalgic     General Gait Details: Again  pt is only able to take a few small steps from chair back to bed with maxA+2. He demonstrates worsening LE buckling and is very tremulous throughout transfer. Poor safety awareness and heavy cues required for safe technique.    Stairs            Wheelchair Mobility    Modified Rankin (Stroke Patients Only)       Balance Overall balance assessment: Needs assistance   Sitting balance-Leahy Scale: Fair       Standing balance-Leahy Scale: Poor                      Cognition Arousal/Alertness: Awake/alert Behavior During Therapy: WFL for tasks assessed/performed Overall Cognitive Status: No family/caregiver present to determine baseline cognitive functioning (AOx2)                      Exercises General Exercises - Lower Extremity Ankle Circles/Pumps: Strengthening;Both;10 reps;Supine Quad Sets: Strengthening;Both;5 reps;Supine (difficulty understanding) Long Arc Quad: Strengthening;Both;10 reps;Seated Heel Slides: Strengthening;Both;10 reps;Seated Hip ABduction/ADduction: Strengthening;Both;10 reps;Seated Straight Leg Raises: Strengthening;Left;10 reps;Supine Hip Flexion/Marching: Strengthening;Both;10 reps;Seated Heel Raises: Strengthening;Both;10 reps;Seated    General Comments        Pertinent Vitals/Pain Pain Assessment: 0-10 Pain Score: 10-Worst pain ever Pain Location: L hip Pain Descriptors / Indicators: Aching Pain Intervention(s): Monitored during session    Home Living Family/patient expects to be discharged to:: Private residence Living Arrangements: Spouse/significant other;Children (Wife and son, son helps with ADL) Available Help at Discharge: Family Type of Home: Mobile home (double wide) Home Access: Ramped entrance   Home Layout: One level Home Equipment: Environmental consultant - 2 wheels;Bedside commode;Shower seat;Wheelchair -  manual      Prior Function Level of Independence: Needs assistance    ADL's / Homemaking Assistance Needed:  Son and wife assist with bathing and dressing     PT Goals (current goals can now be found in the care plan section) Acute Rehab PT Goals Patient Stated Goal: Would rather go home, but willing to go to rehab. PT Goal Formulation: With patient Time For Goal Achievement: 06/22/15 Potential to Achieve Goals: Fair Progress towards PT goals: Progressing toward goals    Frequency  BID    PT Plan Current plan remains appropriate    Co-evaluation             End of Session Equipment Utilized During Treatment: Gait belt Activity Tolerance: Patient limited by pain Patient left: with SCD's reapplied;in bed;with bed alarm set     Time: 1320-     Charges:  $Therapeutic Exercise: 8-22 mins $Therapeutic Activity: 8-22 mins                    G Codes:      Lyndel Safe Huprich PT, DPT   Huprich,Jason 06/08/2015, 2:30 PM

## 2015-06-08 NOTE — Evaluation (Signed)
Occupational Therapy Evaluation Patient Details Name: Marcus [name] MRN: [id number] DOB: September 29, 1945 Today's Date: 06/08/2015    History of Present Illness Pt sufferred a mechanical fall and sufferred a L hip fracture. Pt underwent L hip nailing and is POD#1 at time of evaluation without reported post-op complications   Clinical Impression   This patient is a 69 year old male who came to [facility] after a fall resulting in a L hip fracture. He received an open reduction with internal fixation repair (nail). He lives with his son in a double wide mobile home. His son and wife help with dressing and bathing, but he can complete toileting in his own.      Follow Up Recommendations  SNF    Equipment Recommendations       Recommendations for Other Services       Precautions / Restrictions Precautions Precautions: None Restrictions Weight Bearing Restrictions: Yes LLE Weight Bearing: Partial weight bearing LLE Partial Weight Bearing Percentage or Pounds: 25-50%      Mobility Bed Mobility  Transfers     Balance                              ADL                                         General ADL Comments: Patient got assist from wife and son for bathing and dressing. He had been independent with toileting. Reviewed bathroom safety now that he is partial weight bearing.     Vision     Perception     Praxis      Pertinent Vitals/Pain Pain Assessment: No/denies pain     Hand Dominance Right   Extremity/Trunk Assessment Upper Extremity Assessment Upper Extremity Assessment:  (B UE 5/5)         Communication Communication Communication: No difficulties   Cognition Arousal/Alertness: Awake/alert Behavior During Therapy: WFL for tasks assessed/performed Overall Cognitive Status:  (Oriented to person, place, situation and knows it is the 22nd but did not know the year.)                      General Comments       Exercises Exercises: General Lower Extremity     Shoulder Instructions      Home Living Family/patient expects to be discharged to:: Private residence Living Arrangements: Spouse/significant other;Children (Wife and son, son helps with ADL) Available Help at Discharge: Family Type of Home: Mobile home (double wide) Home Access: Ramped entrance     Home Layout: One level     Bathroom Shower/Tub: Occupational psychologist: Handicapped height Bathroom Accessibility: Yes   Home Equipment: Environmental consultant - 2 wheels;Bedside commode;Shower seat;Wheelchair - manual          Prior Functioning/Environment Level of Independence: Needs assistance    ADL's / Homemaking Assistance Needed: Son and wife assist with bathing and dressing        OT Diagnosis:     OT Problem List: Decreased activity tolerance;Decreased safety awareness   OT Treatment/Interventions: Self-care/ADL training    OT Goals(Current goals can be found in the care plan section) Acute Rehab OT Goals Patient Stated Goal: Would rather go home, but willing to go to rehab. OT Goal Formulation: With patient Time For Goal Achievement:  06/22/15 Potential to Achieve Goals: Good  OT Frequency: Min 1X/week   Barriers to D/C:            Co-evaluation              End of Session    Activity Tolerance:   Patient left: in chair;with call bell/phone within reach;with chair alarm set   Time: 7218-2883 OT Time Calculation (min): 24 min Charges:  OT General Charges $OT Visit: 1 Procedure OT Evaluation $Initial OT Evaluation Tier I: 1 Procedure OT Treatments $Self Care/Home Management : 8-22 mins G-Codes:    Myrene Galas, MS/OTR/L  06/08/2015, 11:37 AM

## 2015-06-08 NOTE — Progress Notes (Addendum)
MD changed IVF from half NS to D5. Ask patient is a diabetic, I called to double check that order was appropriate. MD stated that as pt's NA was 148 she needed to stop the half NS and put him on D5. She stated we could change rate of D5 from 100 to 40 and to encourage pt to drink water. Pt is agreeable to drinking water.

## 2015-06-08 NOTE — Progress Notes (Signed)
Patient demonstrates correct use of incentive spirometer.

## 2015-06-08 NOTE — Progress Notes (Signed)
Subjective: 1 Day Post-Op Procedure(s) (LRB): left hip nailing with DHS (Left)    Patient reports pain as moderate. OOB in chair.  Alert.  Dressing dry.  hgb stable.   Objective:   VITALS:   Filed Vitals:   06/08/15 0956  BP: 117/81  Pulse: 101  Temp:   Resp:     Neurovascular intact Sensation intact distally Intact pulses distally Dorsiflexion/Plantar flexion intact Compartment soft  LABS  Recent Labs  06/06/15 1332 06/07/15 0452 06/08/15 0501  HGB 11.1* 10.5* 10.0*  HCT 34.0* 31.0* 29.9*  WBC 6.3 6.5 5.4  PLT 204 185 170     Recent Labs  06/06/15 1332 06/07/15 0452 06/08/15 0501  NA 145 147* 148*  K 4.4 4.5 4.3  BUN '15 14 15  '$ CREATININE 1.00 1.11 1.02  GLUCOSE 80 165* 116*     Recent Labs  06/06/15 1015 06/06/15 1332  INR 0.94 0.96     Assessment/Plan: 1 Day Post-Op Procedure(s) (LRB): left hip nailing with DHS (Left)   Advance diet Up with therapy D/C IV fluids Discharge to SNF when stable

## 2015-06-08 NOTE — Care Management Important Message (Signed)
Important Message  Patient Details  Name: Marcus [name] MRN: [id number] Date of Birth: 06/18/1946   Medicare Important Message Given:  Yes-second notification given    [name] 06/08/2015, 10:13 AM

## 2015-06-08 NOTE — Progress Notes (Signed)
[facility] NAME: Marcus [name]    MR#:  [id number]  DATE OF BIRTH:  1945/09/28  SUBJECTIVE:  CHIEF COMPLAINT:   Chief Complaint  Patient presents with  . Fall   patient is 69 year old gentleman with history of lung cancer who is undergoing radiation therapy and is followed by [facility] presents to the hospital after fall. On arrival to emergency room, he was noted to have left hip fracture and underwent ORIF by Dr. Earnestine Leys 06/07/2015. Complains of a lot of pain. Admits of using wheelchair and being not mobile. Most recent radiation therapy for lung cancer was approximately a week ago. Bone biopsy results are pending.     Review of Systems  Constitutional: Negative for fever, chills and weight loss.  HENT: Negative for congestion.   Eyes: Negative for blurred vision and double vision.  Respiratory: Negative for cough, sputum production, shortness of breath and wheezing.   Cardiovascular: Negative for chest pain, palpitations, orthopnea, leg swelling and PND.  Gastrointestinal: Negative for nausea, vomiting, abdominal pain, diarrhea, constipation and blood in stool.  Genitourinary: Negative for dysuria, urgency, frequency and hematuria.  Musculoskeletal: Negative for falls.  Neurological: Negative for dizziness, tremors, focal weakness and headaches.  Endo/Heme/Allergies: Does not bruise/bleed easily.  Psychiatric/Behavioral: Negative for depression. The patient does not have insomnia.     VITAL SIGNS: Blood pressure 117/81, pulse 101, temperature 99.8 F (37.7 C), temperature source Oral, resp. rate 16, height '5\' 11"'$  (1.803 m), weight 56.7 kg (125 lb), SpO2 96 %.  PHYSICAL EXAMINATION:   GENERAL:  69 y.o.-year-old patient lying in the bed in mild-to-moderate distress, anxious, in significant pain due to left lower extremity movements by nursing. Screaming intermittently EYES: Pupils equal, round, reactive to  light and accommodation. No scleral icterus. Extraocular muscles intact.  HEENT: Head atraumatic, normocephalic. Oropharynx and nasopharynx clear.  NECK:  Supple, no jugular venous distention. No thyroid enlargement, no tenderness.  LUNGS: Normal breath sounds bilaterally, no wheezing, rales,rhonchi or crepitation. No use of accessory muscles of respiration.  CARDIOVASCULAR: S1, S2 normal. No murmurs, rubs, or gallops.  ABDOMEN: Soft, nontender, nondistended. Bowel sounds present. No organomegaly or mass.  EXTREMITIES: No pedal edema, cyanosis, or clubbing. Left lower extremity dressing at left hip area. No bleeding, swelling was noted.   NEUROLOGIC: Cranial nerves II through XII are intact. Muscle strength 5/5 in all extremities. Sensation intact. Gait not checked.  PSYCHIATRIC: The patient is alert and oriented x 2.  SKIN: No obvious rash, lesion, or ulcer.   ORDERS/RESULTS REVIEWED:   CBC  Recent Labs Lab 06/06/15 1015 06/06/15 1332 06/07/15 0452 06/08/15 0501  WBC 7.5 6.3 6.5 5.4  HGB 11.5* 11.1* 10.5* 10.0*  HCT 34.0* 34.0* 31.0* 29.9*  PLT 197 204 185 170  MCV 106.1* 106.9* 106.8* 105.9*  MCH 35.9* 34.8* 36.2* 35.4*  MCHC 33.8 32.5 33.9 33.4  RDW 15.0* 15.2* 15.3* 15.1*  LYMPHSABS 1.0 1.3  --   --   MONOABS 0.9 0.8  --   --   EOSABS 0.1 0.1  --   --   BASOSABS 0.0 0.0  --   --    ------------------------------------------------------------------------------------------------------------------  Chemistries   Recent Labs Lab 06/06/15 1015 06/06/15 1332 06/07/15 0452 06/08/15 0501  NA 141 145 147* 148*  K 4.1 4.4 4.5 4.3  CL 108 109 113* 113*  CO2 '28 30 28 30  '$ GLUCOSE 183* 80 165* 116*  BUN '15 15 14 '$ 15  CREATININE 1.09 1.00 1.11 1.02  CALCIUM 8.7* 8.8* 8.7* 8.9  AST 20 16  --   --   ALT 16* 15*  --   --   ALKPHOS 67 69  --   --   BILITOT 0.5 0.6  --   --     ------------------------------------------------------------------------------------------------------------------ estimated creatinine clearance is 55.6 mL/min (by C-G formula based on Cr of 1.02). ------------------------------------------------------------------------------------------------------------------ No results for input(s): TSH, T4TOTAL, T3FREE, THYROIDAB in the last 72 hours.  Invalid input(s): FREET3  Cardiac Enzymes No results for input(s): CKMB, TROPONINI, MYOGLOBIN in the last 168 hours.  Invalid input(s): CK ------------------------------------------------------------------------------------------------------------------ Invalid input(s): POCBNP ---------------------------------------------------------------------------------------------------------------  RADIOLOGY: Dg C-arm 61-120 Min  06/07/2015   CLINICAL DATA:  Left hip fracture.  EXAM: LEFT FEMUR 2 VIEWS; DG C-ARM 61-120 MIN  COMPARISON:  Radiographs dated 06/06/2015  FINDINGS: AP and lateral C-arm images demonstrate the patient has undergone open reduction and internal fixation of the intertrochanteric fracture of the proximal left femur. Alignment and position of the fracture fragments is essentially anatomic. Compression screw and in the visualized portion of the sideplate appear in good position.  IMPRESSION: Open reduction and internal fixation of left femur fracture.   Electronically Signed   By: Lorriane Shire M.D.   On: 06/07/2015 18:30   Dg Femur Min 2 Views Left  06/07/2015   CLINICAL DATA:  Left hip fracture.  EXAM: LEFT FEMUR 2 VIEWS; DG C-ARM 61-120 MIN  COMPARISON:  Radiographs dated 06/06/2015  FINDINGS: AP and lateral C-arm images demonstrate the patient has undergone open reduction and internal fixation of the intertrochanteric fracture of the proximal left femur. Alignment and position of the fracture fragments is essentially anatomic. Compression screw and in the visualized portion of the sideplate  appear in good position.  IMPRESSION: Open reduction and internal fixation of left femur fracture.   Electronically Signed   By: Lorriane Shire M.D.   On: 06/07/2015 18:30    EKG:  Orders placed or performed during the hospital encounter of 06/06/15  . ED EKG  . ED EKG  . EKG 12-Lead  . EKG 12-Lead    ASSESSMENT AND PLAN:  Principal Problem:   Closed left hip fracture  1. Closed left hip fracture. Subsequent encounter, status post ORIF 21st of September 2016 by Dr. Earnestine Leys . Patient was initiated on with physical therapy, likely discharged to skilled nursing facility for rehabilitation tomorrow, provided his pain is relatively well controlled,  continue pain management.  2.  Fever, temperature was about 100 today , etiology remains unclear, although UA is markedly abnormal, concerning for possible urinary tract infection/cystitis without hematuria, chest x-ray was unremarkable except of left upper lobe nodule. No pneumonia noted. Get urine cultures and initiate patient on antibiotic therapy if cultures are positive.  3. Hypernatremia. Initially patient was started on D5W, but he is oral intake improved and he is able to drink plain water, we will discontinue his D5 water,  follow sodium level today 4. Anemia, follow with hydration in the perioperative period , seems to be stable at present 5. Poorly differentiated lung carcinoma. Patient is receiving radiation therapy, no intervention at present. Bone biopsy results are pending 6. Pyuria. Questionable urinary tract infection/acute cystitis without hematuria, getting urine cultures and initiate antibiotic therapy as needed   Management plans discussed with the patient, family and they are in agreement.   DRUG ALLERGIES: No Known Allergies  CODE STATUS:     Code Status Orders  Start     Ordered   06/06/15 1258  Do not attempt resuscitation (DNR)   Continuous    Question Answer Comment  In the event of cardiac or  respiratory ARREST Do not call a "code blue"   In the event of cardiac or respiratory ARREST Do not perform Intubation, CPR, defibrillation or ACLS   In the event of cardiac or respiratory ARREST Use medication by any route, position, wound care, and other measures to relive pain and suffering. May use oxygen, suction and manual treatment of airway obstruction as needed for comfort.      06/06/15 1257    Advance Directive Documentation        Most Recent Value   Type of Advance Directive  Out of facility DNR (pink MOST or yellow form)   Pre-existing out of facility DNR order (yellow form or pink MOST form)  Physician notified to receive inpatient order   "MOST" Form in Place?        TOTAL TIME TAKING CARE OF THIS PATIENT: 40 minutes.    Theodoro Grist M.D on 06/08/2015 at 1:04 PM  Between 7am to 6pm - Pager - [phone]  After 6pm go to [email or web address] - password [email or web address] [facility]  [facility]  Office  [phone]  CC: Primary care physician; [name], MD

## 2015-06-08 NOTE — Evaluation (Signed)
Physical Therapy Evaluation Patient Details Name: Marcus [name] MRN: [id number] DOB: 04/09/1946 Today's Date: 06/08/2015   History of Present Illness  Pt sufferred a mechanical fall and sufferred a L hip fracture. Pt underwent L hip nailing and is POD#1 at time of evaluation without reported post-op complications  Clinical Impression  Pt presents with confusion to situation and pain with all AAROM of L hip. He has severe LLE weakness and it is unclear how well he is able to follow LLE PWB precautions. Pt requires maxA+2 for bed mobility, transfers, and very limited ambulation from bed to recliner. He is very weak in standing with severe LE buckling and poor safety awareness. Pt will need SNF placement prior to returning home to ensure return to prior functional level. Pt will benefit from skilled PT services to address deficits in strength, balance, and mobility in order to return to full function at home.     Follow Up Recommendations SNF    Equipment Recommendations  None recommended by PT    Recommendations for Other Services       Precautions / Restrictions Precautions Precautions: None Restrictions Weight Bearing Restrictions: Yes LLE Weight Bearing: Partial weight bearing LLE Partial Weight Bearing Percentage or Pounds: 25-50%      Mobility  Bed Mobility Overal bed mobility: +2 for physical assistance;Needs Assistance Bed Mobility: Supine to Sit     Supine to sit: Max assist;+2 for physical assistance     General bed mobility comments: Painful with all bed mobility. Pt requires maxA+2 to come up to sitting but then only requires minA+1 to scoot toward EOB  Transfers Overall transfer level: Needs assistance Equipment used: Rolling walker (2 wheeled) Transfers: Sit to/from Stand Sit to Stand: Max assist;+2 physical assistance         General transfer comment: Pt requires heavy cues for PWB status during transfer as well as hand placement. Increased time  required to come to standing and once upright requires heavy cues for knee and hip extension. Bilateral LE buckling evident. Performed standing marches at EOB  Ambulation/Gait Ambulation/Gait assistance: +2 physical assistance;Max assist Ambulation Distance (Feet): 3 Feet Assistive device: Rolling walker (2 wheeled) Gait Pattern/deviations: Decreased step length - right;Decreased step length - left;Decreased stance time - left;Shuffle;Antalgic     General Gait Details: Pt only able to take a few small steps from bed to recliner with maxA+2. He presents with severe bilateral LE buckling and pain requiring considerable assist to prevent fall. Unable to attempt further ambulation at this time due to weakness and poor safety  Stairs            Wheelchair Mobility    Modified Rankin (Stroke Patients Only)       Balance Overall balance assessment: Needs assistance   Sitting balance-Leahy Scale: Fair       Standing balance-Leahy Scale: Poor                               Pertinent Vitals/Pain Pain Assessment: No/denies pain    Home Living Family/patient expects to be discharged to:: Private residence Living Arrangements: Spouse/significant other;Children (Son) Available Help at Discharge: Family Type of Home: Mobile home Home Access: Ramped entrance     Home Layout: One level Home Equipment: Environmental consultant - 2 wheels;Bedside commode;Shower seat;Wheelchair - manual      Prior Function Level of Independence: Needs assistance      ADL's / Homemaking Assistance Needed: Son and wife  assist with bathing and dressing        Hand Dominance   Dominant Hand: Right    Extremity/Trunk Assessment   Upper Extremity Assessment: Overall WFL for tasks assessed           Lower Extremity Assessment: Generalized weakness;LLE deficits/detail   LLE Deficits / Details: Pt requires assist for SLR and AROM abduction. Pt screams out in pain during all movement. Pt has  active DF/PF of LLE. He appears to have a possible tear? of gastrocs which appears chronic and pt denies calf pain. Overall bilateral LE but particularly LLE appears atrophied     Communication   Communication: No difficulties  Cognition Arousal/Alertness: Awake/alert Behavior During Therapy: WFL for tasks assessed/performed Overall Cognitive Status: No family/caregiver present to determine baseline cognitive functioning (AOx2, disoriented to situation)                      General Comments      Exercises General Exercises - Lower Extremity Ankle Circles/Pumps: Strengthening;Both;10 reps;Supine Quad Sets: Strengthening;Both;5 reps;Supine (difficulty understanding) Hip ABduction/ADduction: Strengthening;Both;10 reps;Supine Straight Leg Raises: Strengthening;Both;10 reps;Supine      Assessment/Plan    PT Assessment Patient needs continued PT services  PT Diagnosis Difficulty walking;Abnormality of gait;Generalized weakness;Acute pain   PT Problem List Decreased strength;Decreased activity tolerance;Decreased balance;Decreased mobility;Decreased knowledge of use of DME;Decreased safety awareness;Pain  PT Treatment Interventions DME instruction;Gait training;Stair training;Functional mobility training;Therapeutic activities;Therapeutic exercise;Balance training;Neuromuscular re-education;Manual techniques   PT Goals (Current goals can be found in the Care Plan section) Acute Rehab PT Goals Patient Stated Goal: "I'll do whatever it takes to get stronger" PT Goal Formulation: With patient Time For Goal Achievement: 06/22/15 Potential to Achieve Goals: Fair    Frequency BID   Barriers to discharge        Co-evaluation               End of Session Equipment Utilized During Treatment: Gait belt Activity Tolerance: Patient limited by pain Patient left: in chair;with call bell/phone within reach;with chair alarm set;with nursing/sitter in room;with SCD's reapplied  (ice pack on hip) Nurse Communication: Mobility status         Time: 0539-7673 PT Time Calculation (min) (ACUTE ONLY): 36 min   Charges:   PT Evaluation $Initial PT Evaluation Tier I: 1 Procedure PT Treatments $Therapeutic Exercise: 8-22 mins   PT G Codes:       Lyndel Safe Ireene Ballowe PT, DPT   [place] 06/08/2015, 10:33 AM

## 2015-06-09 DIAGNOSIS — D649 Anemia, unspecified: Secondary | ICD-10-CM

## 2015-06-09 DIAGNOSIS — G9341 Metabolic encephalopathy: Secondary | ICD-10-CM

## 2015-06-09 DIAGNOSIS — E870 Hyperosmolality and hypernatremia: Secondary | ICD-10-CM

## 2015-06-09 DIAGNOSIS — R5081 Fever presenting with conditions classified elsewhere: Secondary | ICD-10-CM

## 2015-06-09 DIAGNOSIS — C3490 Malignant neoplasm of unspecified part of unspecified bronchus or lung: Secondary | ICD-10-CM

## 2015-06-09 DIAGNOSIS — N390 Urinary tract infection, site not specified: Secondary | ICD-10-CM

## 2015-06-09 LAB — CBC
HEMATOCRIT: 28.5 % — AB (ref 40.0–52.0)
HEMOGLOBIN: 9.5 g/dL — AB (ref 13.0–18.0)
MCH: 34.9 pg — ABNORMAL HIGH (ref 26.0–34.0)
MCHC: 33.5 g/dL (ref 32.0–36.0)
MCV: 104.1 fL — AB (ref 80.0–100.0)
PLATELETS: 176 10*3/uL (ref 150–440)
RBC: 2.74 MIL/uL — AB (ref 4.40–5.90)
RDW: 14.8 % — ABNORMAL HIGH (ref 11.5–14.5)
WBC: 5.6 10*3/uL (ref 3.8–10.6)

## 2015-06-09 LAB — BASIC METABOLIC PANEL
ANION GAP: 4 — AB (ref 5–15)
BUN: 19 mg/dL (ref 6–20)
CHLORIDE: 113 mmol/L — AB (ref 101–111)
CO2: 29 mmol/L (ref 22–32)
Calcium: 9 mg/dL (ref 8.9–10.3)
Creatinine, Ser: 1.06 mg/dL (ref 0.61–1.24)
GFR calc Af Amer: >60 mL/min (ref >60)
GLUCOSE: 154 mg/dL — AB (ref 65–99)
POTASSIUM: 4.3 mmol/L (ref 3.5–5.1)
Sodium: 146 mmol/L — ABNORMAL HIGH (ref 135–145)

## 2015-06-09 LAB — GLUCOSE, CAPILLARY
GLUCOSE-CAPILLARY: 149 mg/dL — AB (ref 65–99)
Glucose-Capillary: 143 mg/dL — ABNORMAL HIGH (ref 65–99)

## 2015-06-09 LAB — SODIUM: Sodium: 143 mmol/L (ref 135–145)

## 2015-06-09 MED ORDER — MENTHOL 3 MG MT LOZG: 1.0000 | LOZENGE | OROMUCOSAL | Status: DC | PRN

## 2015-06-09 MED ORDER — INSULIN ASPART 100 UNIT/ML ~~LOC~~ SOLN: 0.0000 [IU] | Freq: Three times a day (TID) | SUBCUTANEOUS | Status: DC

## 2015-06-09 MED ORDER — HYDROCODONE-ACETAMINOPHEN 5-325 MG PO TABS: 1.0000 | ORAL_TABLET | Freq: Four times a day (QID) | ORAL | Status: DC | PRN

## 2015-06-09 MED ORDER — SENNA 8.6 MG PO TABS: 1.0000 | ORAL_TABLET | Freq: Two times a day (BID) | ORAL | Status: AC

## 2015-06-09 MED ORDER — METHOCARBAMOL 500 MG PO TABS: 500.0000 mg | ORAL_TABLET | Freq: Four times a day (QID) | ORAL | Status: AC | PRN

## 2015-06-09 MED ORDER — ENSURE ENLIVE PO LIQD: 237.0000 mL | Freq: Two times a day (BID) | ORAL | Status: AC

## 2015-06-09 MED ORDER — BISACODYL 5 MG PO TBEC: 5.0000 mg | DELAYED_RELEASE_TABLET | Freq: Every day | ORAL | Status: AC | PRN

## 2015-06-09 MED ORDER — ALBUTEROL SULFATE (2.5 MG/3ML) 0.083% IN NEBU
2.5000 mg | INHALATION_SOLUTION | RESPIRATORY_TRACT | Status: AC
Start: 2015-06-09 — End: ?

## 2015-06-09 MED ORDER — GUAIFENESIN ER 600 MG PO TB12: 600.0000 mg | ORAL_TABLET | Freq: Two times a day (BID) | ORAL | Status: DC | PRN

## 2015-06-09 MED ORDER — DEXTROMETHORPHAN POLISTIREX ER 30 MG/5ML PO SUER: 30.0000 mg | Freq: Two times a day (BID) | ORAL | Status: DC | PRN

## 2015-06-09 MED ORDER — ALUM & MAG HYDROXIDE-SIMETH 200-200-20 MG/5ML PO SUSP: 30.0000 mL | ORAL | Status: AC | PRN

## 2015-06-09 MED ORDER — INSULIN ASPART 100 UNIT/ML ~~LOC~~ SOLN: 0.0000 [IU] | Freq: Every day | SUBCUTANEOUS | Status: DC

## 2015-06-09 MED ORDER — DEXTROSE 5 % IV SOLN
INTRAVENOUS | Status: DC
  Administered 2015-06-09: 10:00:00 via INTRAVENOUS

## 2015-06-09 MED ORDER — OXYCODONE HCL 5 MG PO TABS: 5.0000 mg | ORAL_TABLET | ORAL | Status: DC | PRN

## 2015-06-09 MED ORDER — FOSFOMYCIN TROMETHAMINE 3 G PO PACK
3.0000 g | PACK | Freq: Once | ORAL | Status: AC
  Administered 2015-06-09: 3 g via ORAL
  Filled 2015-06-09: qty 3

## 2015-06-09 MED ORDER — MAGNESIUM HYDROXIDE 400 MG/5ML PO SUSP: 30.0000 mL | Freq: Every day | ORAL | Status: AC | PRN

## 2015-06-09 MED ORDER — DOCUSATE SODIUM 100 MG PO CAPS: 100.0000 mg | ORAL_CAPSULE | Freq: Two times a day (BID) | ORAL | Status: DC

## 2015-06-09 MED ORDER — FERROUS SULFATE 325 (65 FE) MG PO TABS: 325.0000 mg | ORAL_TABLET | Freq: Every day | ORAL | Status: AC

## 2015-06-09 NOTE — Care Management (Signed)
Patient discharging today to [facility].  Patient still currently on 2 liters of O2.  RNCM singing off.

## 2015-06-09 NOTE — Discharge Summary (Signed)
[facility] NAME: Marcus [name]    MR#:  [id number]  DATE OF BIRTH:  1946-03-27  DATE OF ADMISSION:  06/06/2015 ADMITTING PHYSICIAN: Demetrios Loll, MD  DATE OF DISCHARGE: No discharge date for patient encounter.  PRIMARY CARE PHYSICIAN: [name], MD     ADMISSION DIAGNOSIS:  Femur fracture, left, closed, initial encounter [S72.92XA]  DISCHARGE DIAGNOSIS:  Principal Problem:   Closed left hip fracture Active Problems:   Infection of urinary tract   Fever presenting with conditions classified elsewhere   Hypernatremia   Encephalopathy, metabolic   Anemia   Lung cancer   SECONDARY DIAGNOSIS:   Past Medical History  Diagnosis Date  . Diabetes   . Vascular dementia   . Bipolar 1 disorder   . Stroke 1998  . Heart disease   . CAD (coronary artery disease)   . Hypertension   . Shortness of breath dyspnea   . Hypothyroidism   . Tuberculosis 2015  . Cancer     .pro HOSPITAL COURSE:   Patient is a 69 year old Caucasian male with past medical history significant for history of poorly differentiated lung carcinoma for which he undergoes radiation therapy, hypertension, diabetes, coronary artery disease who presents to the hospital after a fall with left hip pain. On arrival to emergency room X-rays revealed moderately angulated intratrochanteric fracture of proximal left femur. Patient was consulted by orthopedist surgeon, Dr. Earnestine Leys and underwent left hip nailing with DHS plate and screw. Postoperatively, patient did well, had physical therapy and was recommended rehabilitation placement. While in the hospital. However, patient was noted to have elevated temperature to around 100. Urinalysis revealed pyuria, concerning for urinary tract infection and one dose of fosfomycin was administered on 06/09/2015 Discussion by problem 1. Closed left hip fracture. status post left hip nailing with DHS plate and  screw [id number] of September 2016 by Dr. Earnestine Leys . Patient is to continue physical therapy in  skilled nursing facility , discharged today. Continue aspirin therapy, continue pain management. Follow-up with Dr. Earnestine Leys in 1 week 2. Fever, likely due to urinary tract infection/cystitis without hematuria, chest x-ray was unremarkable except of left upper lobe nodule, chronic. No pneumonia noted. Awaiting for urine cultures . Give one dose of fosfomycin today  3. Hypernatremia. Improved however sodium level was a little higher today in the morning again, initiate patient on D5 water and recheck sodium level later today, discontinue home medication Lasix, follow sodium level as outpatient. Overall oral intake improved significantly and the patient is able to drink plain water well  4. Anemia, stable with hydration in the perioperative period . Continue iron supplementation 5. Poorly differentiated lung carcinoma. Patient is receiving radiation therapy, no intervention at present. Bone biopsy results are pending, follow up with Dr. Earnestine Leys to find out the results 6. Urinary tract infection/acute cystitis without hematuria, pending urine cultures . Give one dose of fosfomycin DISCHARGE CONDITIONS:   Stable  CONSULTS OBTAINED:  Treatment Team:  Earnestine Leys, MD  DRUG ALLERGIES:  No Known Allergies  DISCHARGE MEDICATIONS:   Current Discharge Medication List    START taking these medications   Details  albuterol (PROVENTIL) (2.5 MG/3ML) 0.083% nebulizer solution Take 3 mLs (2.5 mg total) by nebulization every 4 (four) hours. Qty: 75 mL, Refills: 12    alum & mag hydroxide-simeth (MAALOX/MYLANTA) 200-200-20 MG/5ML suspension Take 30 mLs by mouth every 4 (four) hours as needed for indigestion. Qty: 355 mL, Refills:  0    bisacodyl (DULCOLAX) 5 MG EC tablet Take 1 tablet (5 mg total) by mouth daily as needed for moderate constipation. Qty: 30 tablet, Refills: 0    dextromethorphan  (DELSYM) 30 MG/5ML liquid Take 5 mLs (30 mg total) by mouth 2 (two) times daily as needed for cough. Qty: 89 mL, Refills: 0    docusate sodium (COLACE) 100 MG capsule Take 1 capsule (100 mg total) by mouth 2 (two) times daily. Qty: 10 capsule, Refills: 0    feeding supplement, ENSURE ENLIVE, (ENSURE ENLIVE) LIQD Take 237 mLs by mouth 2 (two) times daily between meals. Qty: 237 mL, Refills: 12    ferrous sulfate 325 (65 FE) MG tablet Take 1 tablet (325 mg total) by mouth daily with breakfast. Qty: 30 tablet, Refills: 3    guaiFENesin (MUCINEX) 600 MG 12 hr tablet Take 1 tablet (600 mg total) by mouth 2 (two) times daily as needed for cough. Qty: 20 tablet, Refills: 0    HYDROcodone-acetaminophen (NORCO/VICODIN) 5-325 MG per tablet Take 1-2 tablets by mouth every 6 (six) hours as needed for moderate pain. Qty: 30 tablet, Refills: 0    !! insulin aspart (NOVOLOG) 100 UNIT/ML injection Inject 0-5 Units into the skin at bedtime. Qty: 10 mL, Refills: 11    !! insulin aspart (NOVOLOG) 100 UNIT/ML injection Inject 0-9 Units into the skin 3 (three) times daily with meals. Qty: 10 mL, Refills: 11    magnesium hydroxide (MILK OF MAGNESIA) 400 MG/5ML suspension Take 30 mLs by mouth daily as needed for mild constipation. Qty: 360 mL, Refills: 0    menthol-cetylpyridinium (CEPACOL) 3 MG lozenge Take 1 lozenge (3 mg total) by mouth as needed for sore throat (sore throat). Qty: 100 tablet, Refills: 12    methocarbamol (ROBAXIN) 500 MG tablet Take 1 tablet (500 mg total) by mouth every 6 (six) hours as needed for muscle spasms. Qty: 60 tablet, Refills: 0    oxyCODONE (OXY IR/ROXICODONE) 5 MG immediate release tablet Take 1 tablet (5 mg total) by mouth every 4 (four) hours as needed for moderate pain. Qty: 30 tablet, Refills: 0    senna (SENOKOT) 8.6 MG TABS tablet Take 1 tablet (8.6 mg total) by mouth 2 (two) times daily. Qty: 120 each, Refills: 0     !! - Potential duplicate medications found.  Please discuss with provider.    CONTINUE these medications which have NOT CHANGED   Details  acetaminophen (TYLENOL) 325 MG tablet Take 650 mg by mouth every 4 (four) hours as needed for mild pain, fever or headache.     aspirin EC 81 MG tablet Take 81 mg by mouth daily.    atorvastatin (LIPITOR) 40 MG tablet Take 40 mg by mouth at bedtime.    !! divalproex (DEPAKOTE ER) 250 MG 24 hr tablet Take 250 mg by mouth at bedtime. Pt takes with two '500mg'$  tablets.    !! divalproex (DEPAKOTE ER) 500 MG 24 hr tablet Take 1,000 mg by mouth at bedtime.    gabapentin (NEURONTIN) 600 MG tablet Take 600 mg by mouth at bedtime.    levothyroxine (SYNTHROID, LEVOTHROID) 75 MCG tablet Take 75 mcg by mouth daily before breakfast.    loperamide (IMODIUM A-D) 2 MG tablet Take 2 mg by mouth 4 (four) times daily as needed for diarrhea or loose stools.    metoprolol succinate (TOPROL-XL) 50 MG 24 hr tablet Take 50 mg by mouth daily.     OLANZapine (ZYPREXA) 20 MG tablet Take 20  mg by mouth at bedtime.    psyllium (REGULOID) 0.52 G capsule Take 0.52 g by mouth daily.    Vitamin D, Ergocalciferol, (DRISDOL) 50000 UNITS CAPS capsule Take 50,000 Units by mouth every 30 (thirty) days.    Vitamins A & D (VITAMIN A & D) ointment Apply 1 application topically 2 (two) times daily as needed (barrier).     !! - Potential duplicate medications found. Please discuss with provider.    STOP taking these medications     dextromethorphan-guaiFENesin (MUCINEX DM) 30-600 MG per 12 hr tablet      furosemide (LASIX) 20 MG tablet          DISCHARGE INSTRUCTIONS:    Follow-up with primary care physician, Dr. Bertram Gala and the orthopedist surgeon, Dr. Earnestine Leys in 1 week after discharge  If you experience worsening of your admission symptoms, develop shortness of breath, life threatening emergency, suicidal or homicidal thoughts you must seek medical attention immediately by calling 911 or calling your MD  immediately  if symptoms less severe.  You Must read complete instructions/literature along with all the possible adverse reactions/side effects for all the Medicines you take and that have been prescribed to you. Take any new Medicines after you have completely understood and accept all the possible adverse reactions/side effects.   Please note  You were cared for by a hospitalist during your hospital stay. If you have any questions about your discharge medications or the care you received while you were in the hospital after you are discharged, you can call the unit and asked to speak with the hospitalist on call if the hospitalist that took care of you is not available. Once you are discharged, your primary care physician will handle any further medical issues. Please note that NO REFILLS for any discharge medications will be authorized once you are discharged, as it is imperative that you return to your primary care physician (or establish a relationship with a primary care physician if you do not have one) for your aftercare needs so that they can reassess your need for medications and monitor your lab values.    Today   CHIEF COMPLAINT:   Chief Complaint  Patient presents with  . Fall    HISTORY OF PRESENT ILLNESS:  Marcus [name]  is a 69 y.o. male with a known history of poorly differentiated lung carcinoma for which he undergoes radiation therapy, hypertension, diabetes, coronary artery disease who presents to the hospital after a fall with left hip pain. On arrival to emergency room X-rays revealed moderately angulated intratrochanteric fracture of proximal left femur. Patient was consulted by orthopedist surgeon, Dr. Earnestine Leys and underwent left hip nailing with DHS plate and screw. Postoperatively, patient did well, had physical therapy and was recommended rehabilitation placement. While in the hospital. However, patient was noted to have elevated temperature to around 100.  Urinalysis revealed pyuria, concerning for urinary tract infection and one dose of fosfomycin was administered on 06/09/2015 Discussion by problem 1. Closed left hip fracture. status post left hip nailing with DHS plate and screw 40GQ of September 2016 by Dr. Earnestine Leys . Patient is to continue physical therapy in  skilled nursing facility , discharged today. Continue aspirin therapy, continue pain management. Follow-up with Dr. Earnestine Leys in 1 week 2. Fever, likely due to urinary tract infection/cystitis without hematuria, chest x-ray was unremarkable except of left upper lobe nodule, chronic. No pneumonia noted. Awaiting for urine cultures . Give one dose of fosfomycin today  3. Hypernatremia. Improved however sodium level was a little higher today in the morning again, initiate patient on D5 water and recheck sodium level later today, discontinue home medication Lasix, follow sodium level as outpatient. Overall oral intake improved significantly and the patient is able to drink plain water well  4. Anemia, stable with hydration in the perioperative period . Continue iron supplementation 5. Poorly differentiated lung carcinoma. Patient is receiving radiation therapy, no intervention at present. Bone biopsy results are pending, follow up with Dr. Earnestine Leys to find out the results 6. Urinary tract infection/acute cystitis without hematuria, pending urine cultures . Give one dose of fosfomycin   VITAL SIGNS:  Blood pressure 115/58, pulse 100, temperature 97.4 F (36.3 C), temperature source Oral, resp. rate 18, height '5\' 11"'$  (1.803 m), weight 56.7 kg (125 lb), SpO2 91 %.  I/O:   Intake/Output Summary (Last 24 hours) at 06/09/15 0933 Last data filed at 06/08/15 2214  Gross per 24 hour  Intake 633.33 ml  Output    350 ml  Net 283.33 ml    PHYSICAL EXAMINATION:  GENERAL:  69 y.o.-year-old patient lying in the bed with no acute distress.  EYES: Pupils equal, round, reactive to light  and accommodation. No scleral icterus. Extraocular muscles intact.  HEENT: Head atraumatic, normocephalic. Oropharynx and nasopharynx clear.  NECK:  Supple, no jugular venous distention. No thyroid enlargement, no tenderness.  LUNGS: Normal breath sounds bilaterally, no wheezing, rales,rhonchi or crepitation. No use of accessory muscles of respiration.  CARDIOVASCULAR: S1, S2 normal. No murmurs, rubs, or gallops.  ABDOMEN: Soft, non-tender, non-distended. Bowel sounds present. No organomegaly or mass.  EXTREMITIES: No pedal edema, cyanosis, or clubbing.  NEUROLOGIC: Cranial nerves II through XII are intact. Muscle strength 5/5 in all extremities. Sensation intact. Gait not checked.  PSYCHIATRIC: The patient is alert and oriented x 3.  SKIN: No obvious rash, lesion, or ulcer.   DATA REVIEW:   CBC  Recent Labs Lab 06/09/15 0646  WBC 5.6  HGB 9.5*  HCT 28.5*  PLT 176    Chemistries   Recent Labs Lab 06/06/15 1332  06/09/15 0646  NA 145  < > 146*  K 4.4  < > 4.3  CL 109  < > 113*  CO2 30  < > 29  GLUCOSE 80  < > 154*  BUN 15  < > 19  CREATININE 1.00  < > 1.06  CALCIUM 8.8*  < > 9.0  AST 16  --   --   ALT 15*  --   --   ALKPHOS 69  --   --   BILITOT 0.6  --   --   < > = values in this interval not displayed.  Cardiac Enzymes No results for input(s): TROPONINI in the last 168 hours.  Microbiology Results  Results for orders placed or performed during the hospital encounter of 06/06/15  Surgical pcr screen     Status: None   Collection Time: 06/07/15  2:35 PM  Result Value Ref Range Status   MRSA, PCR NEGATIVE NEGATIVE Final   Staphylococcus aureus NEGATIVE NEGATIVE Final    Comment:        The Xpert SA Assay (FDA approved for NASAL specimens in patients over 45 years of age), is one component of a comprehensive surveillance program.  Test performance has been validated by [facility] for patients greater than or equal to 71 year old. It is not intended to  diagnose infection nor to guide or  monitor treatment.     RADIOLOGY:  Dg C-arm 61-120 Min  06/07/2015   CLINICAL DATA:  Left hip fracture.  EXAM: LEFT FEMUR 2 VIEWS; DG C-ARM 61-120 MIN  COMPARISON:  Radiographs dated 06/06/2015  FINDINGS: AP and lateral C-arm images demonstrate the patient has undergone open reduction and internal fixation of the intertrochanteric fracture of the proximal left femur. Alignment and position of the fracture fragments is essentially anatomic. Compression screw and in the visualized portion of the sideplate appear in good position.  IMPRESSION: Open reduction and internal fixation of left femur fracture.   Electronically Signed   By: Lorriane Shire M.D.   On: 06/07/2015 18:30   Dg Femur Min 2 Views Left  06/07/2015   CLINICAL DATA:  Left hip fracture.  EXAM: LEFT FEMUR 2 VIEWS; DG C-ARM 61-120 MIN  COMPARISON:  Radiographs dated 06/06/2015  FINDINGS: AP and lateral C-arm images demonstrate the patient has undergone open reduction and internal fixation of the intertrochanteric fracture of the proximal left femur. Alignment and position of the fracture fragments is essentially anatomic. Compression screw and in the visualized portion of the sideplate appear in good position.  IMPRESSION: Open reduction and internal fixation of left femur fracture.   Electronically Signed   By: Lorriane Shire M.D.   On: 06/07/2015 18:30    EKG:   Orders placed or performed during the hospital encounter of 06/06/15  . ED EKG  . ED EKG  . EKG 12-Lead  . EKG 12-Lead      Management plans discussed with the patient, family and they are in agreement.  CODE STATUS:     Code Status Orders        Start     Ordered   06/07/15 1954  Do not attempt resuscitation (DNR)   Continuous    Question Answer Comment  In the event of cardiac or respiratory ARREST Do not call a "code blue"   In the event of cardiac or respiratory ARREST Do not perform Intubation, CPR, defibrillation or ACLS    In the event of cardiac or respiratory ARREST Use medication by any route, position, wound care, and other measures to relive pain and suffering. May use oxygen, suction and manual treatment of airway obstruction as needed for comfort.      06/07/15 1953    Advance Directive Documentation        Most Recent Value   Type of Advance Directive  Out of facility DNR (pink MOST or yellow form)   Pre-existing out of facility DNR order (yellow form or pink MOST form)  Physician notified to receive inpatient order   "MOST" Form in Place?        TOTAL TIME TAKING CARE OF THIS PATIENT: 45 minutes.    Theodoro Grist M.D on 06/09/2015 at 9:33 AM  Between 7am to 6pm - Pager - [phone]  After 6pm go to [email or web address] - password [email or web address] [facility]  [facility]  Office  [phone]  CC: Primary care physician; [name], MD

## 2015-06-09 NOTE — Progress Notes (Signed)
Pt is to be discharged to [facility] today. Pt is in NAD, IV is out, all paperwork has been reviewed/discussed with patient, and there are no questions/concerns at this time. Assessment is unchanged from this morning. Pt will transfer to the nursing facility via EMS. Report has been called to receiving nurse.

## 2015-06-09 NOTE — Progress Notes (Signed)
Subjective: 2 Days Post-Op Procedure(s) (LRB): left hip nailing with DHS (Left)    Patient reports pain as mild.  Objective:   VITALS:   Filed Vitals:   06/09/15 1050  BP:   Pulse:   Temp: 97.9 F (36.6 C)  Resp:     Neurovascular intact Sensation intact distally Intact pulses distally Dorsiflexion/Plantar flexion intact No cellulitis present Dressing changed.  Wound benign  LABS  Recent Labs  06/07/15 0452 06/08/15 0501 06/09/15 0646  HGB 10.5* 10.0* 9.5*  HCT 31.0* 29.9* 28.5*  WBC 6.5 5.4 5.6  PLT 185 170 176     Recent Labs  06/07/15 0452 06/08/15 0501 06/08/15 1329 06/09/15 0646  NA 147* 148* 143 146*  K 4.5 4.3  --  4.3  BUN 14 15  --  19  CREATININE 1.11 1.02  --  1.06  GLUCOSE 165* 116*  --  154*     Recent Labs  06/06/15 1332  INR 0.96     Assessment/Plan: 2 Days Post-Op Procedure(s) (LRB): left hip nailing with DHS (Left)   Up with therapy Discharge to SNF  RTC 1 week

## 2015-06-09 NOTE — Progress Notes (Signed)
Physical Therapy Treatment Patient Details Name: Marcus [name] MRN: [id number] DOB: July 03, 1946 Today's Date: 06/09/2015    History of Present Illness Pt sufferred a mechanical fall and sufferred a L hip fracture. Pt underwent L hip nailing and is POD#2 at time of evaluation without reported post-op complications    PT Comments    Patient tolerating movement better today with significantly less complaints of pain. Patient continues to display flat affect, however good motivation to progress OOB mobility. He buckles numerous times during lateral stepping and requires max A x2 to maintain upright. Patient was able to take 1-2 steps without buckling, however he fatigued quickly. Patient continues to demonstrate decreased bed mobility, transfers, and ambulation and would benefit from STR to maximize his mobility.   Follow Up Recommendations  SNF     Equipment Recommendations       Recommendations for Other Services       Precautions / Restrictions Precautions Precautions: Fall Restrictions Weight Bearing Restrictions: Yes LLE Weight Bearing: Partial weight bearing LLE Partial Weight Bearing Percentage or Pounds: 25-50%    Mobility  Bed Mobility   Bed Mobility: Supine to Sit;Sit to Supine     Supine to sit: Max assist Sit to supine: +2 for physical assistance;Max assist   General bed mobility comments: Patient able to transfer to EOB initially with max A x1 and no increase in pain. Required +2 for management of LEs as he appears very rigid throughout his LEs during this session.   Transfers Overall transfer level: Needs assistance Equipment used: Rolling walker (2 wheeled) Transfers: Sit to/from Stand Sit to Stand: Mod assist;Min assist;+2 physical assistance         General transfer comment: Patient required slightly more assistance than yesterday, required cuing for hand placement and transfer of hands upon standing. Buckling bilaterally evident in  LEs.  Ambulation/Gait Ambulation/Gait assistance: Max assist;+2 physical assistance Ambulation Distance (Feet): 3 Feet Assistive device: Rolling walker (2 wheeled)     Gait velocity interpretation: <1.8 ft/sec, indicative of risk for recurrent falls General Gait Details: Patient took 3 side steps laterally near bed, deferred further ambulation as patient had multiple episodes of LE buckling. Patient was able to take 1-2 steps with minimal buckling, however he fatigued quickly.    Stairs            Wheelchair Mobility    Modified Rankin (Stroke Patients Only)       Balance Overall balance assessment: Needs assistance   Sitting balance-Leahy Scale: Fair Sitting balance - Comments: No loss of balance noted with activity in sitting.    Standing balance support: Bilateral upper extremity supported Standing balance-Leahy Scale: Poor Standing balance comment: Patient with multiple episodes of LE buckling secondary to markedly decreased strength in bilateral LEs.                     Cognition Arousal/Alertness: Awake/alert Behavior During Therapy: WFL for tasks assessed/performed Overall Cognitive Status: No family/caregiver present to determine baseline cognitive functioning                      Exercises General Exercises - Lower Extremity Ankle Circles/Pumps: Strengthening;Both;10 reps;Supine Long Arc Quad: Strengthening;Both;10 reps;Seated Heel Slides: Strengthening;Both;10 reps;Seated;AAROM    General Comments        Pertinent Vitals/Pain Pain Assessment:  (Denies pain initially, then yelps out in pain with movement of his LLE initially. No complaints afterwards.)    Home Living  Prior Function            PT Goals (current goals can now be found in the care plan section) Acute Rehab PT Goals Patient Stated Goal: Would rather go home, but willing to go to rehab. PT Goal Formulation: With patient Time For Goal  Achievement: 06/22/15 Potential to Achieve Goals: Fair Progress towards PT goals: Progressing toward goals    Frequency  BID    PT Plan Current plan remains appropriate    Co-evaluation             End of Session Equipment Utilized During Treatment: Gait belt Activity Tolerance: Patient limited by pain Patient left: in bed;with call bell/phone within reach;with bed alarm set     Time: 0301-4996 PT Time Calculation (min) (ACUTE ONLY): 25 min  Charges:  $Gait Training: 8-22 mins $Therapeutic Exercise: 8-22 mins                    G Codes:      Kerman Passey, PT, DPT    06/09/2015, 1:09 PM

## 2015-06-10 LAB — URINE CULTURE
Culture: 5000
SPECIAL REQUESTS: NORMAL

## 2015-06-12 ENCOUNTER — Encounter: Payer: Self-pay | Admitting: Specialist

## 2015-06-13 LAB — GLUCOSE, CAPILLARY
GLUCOSE-CAPILLARY: 310 mg/dL — AB (ref 65–99)
Glucose-Capillary: 148 mg/dL — ABNORMAL HIGH (ref 65–99)

## 2015-07-12 ENCOUNTER — Ambulatory Visit: Payer: Medicare (Managed Care) | Admitting: Radiation Oncology

## 2015-07-26 ENCOUNTER — Emergency Department: Payer: Medicare (Managed Care)

## 2015-07-26 ENCOUNTER — Inpatient Hospital Stay
Admission: EM | Admit: 2015-07-26 | Discharge: 2015-07-31 | DRG: 872 | Disposition: A | Discharge status: Skilled Nursing Facility | Payer: Medicare (Managed Care) | Attending: Internal Medicine | Admitting: Internal Medicine

## 2015-07-26 ENCOUNTER — Encounter: Payer: Self-pay | Admitting: Emergency Medicine

## 2015-07-26 DIAGNOSIS — F319 Bipolar disorder, unspecified: Secondary | ICD-10-CM | POA: Diagnosis present

## 2015-07-26 DIAGNOSIS — K5090 Crohn's disease, unspecified, without complications: Secondary | ICD-10-CM | POA: Diagnosis present

## 2015-07-26 DIAGNOSIS — E872 Acidosis: Secondary | ICD-10-CM | POA: Diagnosis present

## 2015-07-26 DIAGNOSIS — I10 Essential (primary) hypertension: Secondary | ICD-10-CM | POA: Diagnosis present

## 2015-07-26 DIAGNOSIS — G629 Polyneuropathy, unspecified: Secondary | ICD-10-CM | POA: Diagnosis present

## 2015-07-26 DIAGNOSIS — A419 Sepsis, unspecified organism: Secondary | ICD-10-CM | POA: Diagnosis present

## 2015-07-26 DIAGNOSIS — Z8611 Personal history of tuberculosis: Secondary | ICD-10-CM

## 2015-07-26 DIAGNOSIS — R32 Unspecified urinary incontinence: Secondary | ICD-10-CM | POA: Diagnosis present

## 2015-07-26 DIAGNOSIS — Z1612 Extended spectrum beta lactamase (ESBL) resistance: Secondary | ICD-10-CM | POA: Diagnosis present

## 2015-07-26 DIAGNOSIS — N390 Urinary tract infection, site not specified: Secondary | ICD-10-CM | POA: Diagnosis present

## 2015-07-26 DIAGNOSIS — Z794 Long term (current) use of insulin: Secondary | ICD-10-CM

## 2015-07-26 DIAGNOSIS — F0150 Vascular dementia without behavioral disturbance: Secondary | ICD-10-CM | POA: Diagnosis present

## 2015-07-26 DIAGNOSIS — E039 Hypothyroidism, unspecified: Secondary | ICD-10-CM | POA: Diagnosis present

## 2015-07-26 DIAGNOSIS — Z8673 Personal history of transient ischemic attack (TIA), and cerebral infarction without residual deficits: Secondary | ICD-10-CM

## 2015-07-26 DIAGNOSIS — I959 Hypotension, unspecified: Secondary | ICD-10-CM | POA: Diagnosis present

## 2015-07-26 DIAGNOSIS — Z951 Presence of aortocoronary bypass graft: Secondary | ICD-10-CM | POA: Diagnosis not present

## 2015-07-26 DIAGNOSIS — L8990 Pressure ulcer of unspecified site, unspecified stage: Secondary | ICD-10-CM | POA: Insufficient documentation

## 2015-07-26 DIAGNOSIS — L89152 Pressure ulcer of sacral region, stage 2: Secondary | ICD-10-CM | POA: Diagnosis present

## 2015-07-26 DIAGNOSIS — E11622 Type 2 diabetes mellitus with other skin ulcer: Secondary | ICD-10-CM | POA: Diagnosis present

## 2015-07-26 DIAGNOSIS — Z820 Family history of epilepsy and other diseases of the nervous system: Secondary | ICD-10-CM | POA: Diagnosis not present

## 2015-07-26 DIAGNOSIS — Z79899 Other long term (current) drug therapy: Secondary | ICD-10-CM

## 2015-07-26 DIAGNOSIS — Z452 Encounter for adjustment and management of vascular access device: Secondary | ICD-10-CM

## 2015-07-26 DIAGNOSIS — Z7982 Long term (current) use of aspirin: Secondary | ICD-10-CM | POA: Diagnosis not present

## 2015-07-26 DIAGNOSIS — L89529 Pressure ulcer of left ankle, unspecified stage: Secondary | ICD-10-CM | POA: Diagnosis present

## 2015-07-26 DIAGNOSIS — Z8249 Family history of ischemic heart disease and other diseases of the circulatory system: Secondary | ICD-10-CM

## 2015-07-26 DIAGNOSIS — E1140 Type 2 diabetes mellitus with diabetic neuropathy, unspecified: Secondary | ICD-10-CM | POA: Diagnosis present

## 2015-07-26 DIAGNOSIS — Z87891 Personal history of nicotine dependence: Secondary | ICD-10-CM | POA: Diagnosis not present

## 2015-07-26 DIAGNOSIS — R6510 Systemic inflammatory response syndrome (SIRS) of non-infectious origin without acute organ dysfunction: Secondary | ICD-10-CM | POA: Diagnosis present

## 2015-07-26 DIAGNOSIS — L89612 Pressure ulcer of right heel, stage 2: Secondary | ICD-10-CM | POA: Diagnosis present

## 2015-07-26 DIAGNOSIS — I2510 Atherosclerotic heart disease of native coronary artery without angina pectoris: Secondary | ICD-10-CM | POA: Diagnosis present

## 2015-07-26 LAB — COMPREHENSIVE METABOLIC PANEL
ALT: 15 U/L — AB (ref 17–63)
AST: 26 U/L (ref 15–41)
Albumin: 2.3 g/dL — ABNORMAL LOW (ref 3.5–5.0)
Alkaline Phosphatase: 187 U/L — ABNORMAL HIGH (ref 38–126)
Anion gap: 7 (ref 5–15)
BUN: 20 mg/dL (ref 6–20)
CHLORIDE: 107 mmol/L (ref 101–111)
CO2: 25 mmol/L (ref 22–32)
CREATININE: 1.38 mg/dL — AB (ref 0.61–1.24)
Calcium: 8.7 mg/dL — ABNORMAL LOW (ref 8.9–10.3)
GFR calc non Af Amer: 51 mL/min — ABNORMAL LOW (ref >60)
GFR, EST AFRICAN AMERICAN: 59 mL/min — AB (ref >60)
Glucose, Bld: 146 mg/dL — ABNORMAL HIGH (ref 65–99)
POTASSIUM: 4.4 mmol/L (ref 3.5–5.1)
SODIUM: 139 mmol/L (ref 135–145)
Total Bilirubin: 0.4 mg/dL (ref 0.3–1.2)
Total Protein: 6.3 g/dL — ABNORMAL LOW (ref 6.5–8.1)

## 2015-07-26 LAB — GLUCOSE, CAPILLARY: Glucose-Capillary: 137 mg/dL — ABNORMAL HIGH (ref 65–99)

## 2015-07-26 LAB — CBC WITH DIFFERENTIAL/PLATELET
Basophils Absolute: 0 10*3/uL (ref 0–0.1)
Basophils Relative: 0 %
EOS ABS: 0 10*3/uL (ref 0–0.7)
Eosinophils Relative: 0 %
HCT: 33 % — ABNORMAL LOW (ref 40.0–52.0)
HEMOGLOBIN: 10.5 g/dL — AB (ref 13.0–18.0)
LYMPHS ABS: 0.5 10*3/uL — AB (ref 1.0–3.6)
LYMPHS PCT: 6 %
MCH: 32.2 pg (ref 26.0–34.0)
MCHC: 31.8 g/dL — AB (ref 32.0–36.0)
MCV: 101.2 fL — AB (ref 80.0–100.0)
MONOS PCT: 6 %
Monocytes Absolute: 0.6 10*3/uL (ref 0.2–1.0)
NEUTROS PCT: 88 %
Neutro Abs: 7.7 10*3/uL — ABNORMAL HIGH (ref 1.4–6.5)
Platelets: 299 10*3/uL (ref 150–440)
RBC: 3.26 MIL/uL — ABNORMAL LOW (ref 4.40–5.90)
RDW: 17.3 % — ABNORMAL HIGH (ref 11.5–14.5)
WBC: 8.8 10*3/uL (ref 3.8–10.6)

## 2015-07-26 LAB — CREATININE, SERUM: Creatinine, Ser: 1.18 mg/dL (ref 0.61–1.24)

## 2015-07-26 LAB — VALPROIC ACID LEVEL: Valproic Acid Lvl: 62 ug/mL (ref 50.0–100.0)

## 2015-07-26 LAB — LACTIC ACID, PLASMA
Lactic Acid, Venous: 2.9 mmol/L (ref 0.5–2.0)
Lactic Acid, Venous: 2.7 mmol/L (ref 0.5–2.0)

## 2015-07-26 LAB — CBC
HCT: 30.7 % — ABNORMAL LOW (ref 40.0–52.0)
Hemoglobin: 9.8 g/dL — ABNORMAL LOW (ref 13.0–18.0)
MCH: 32.6 pg (ref 26.0–34.0)
MCHC: 32.1 g/dL (ref 32.0–36.0)
MCV: 101.8 fL — ABNORMAL HIGH (ref 80.0–100.0)
Platelets: 269 10*3/uL (ref 150–440)
RBC: 3.02 MIL/uL — ABNORMAL LOW (ref 4.40–5.90)
RDW: 17.2 % — ABNORMAL HIGH (ref 11.5–14.5)
WBC: 6.7 10*3/uL (ref 3.8–10.6)

## 2015-07-26 MED ORDER — SENNA 8.6 MG PO TABS
1.0000 | ORAL_TABLET | Freq: Two times a day (BID) | ORAL | Status: DC
  Administered 2015-07-26 – 2015-07-31 (×10): 8.6 mg via ORAL
  Filled 2015-07-26 (×10): qty 1

## 2015-07-26 MED ORDER — MAGNESIUM HYDROXIDE 400 MG/5ML PO SUSP: 30.0000 mL | Freq: Every day | ORAL | Status: DC | PRN

## 2015-07-26 MED ORDER — ACETAMINOPHEN 325 MG PO TABS
650.0000 mg | ORAL_TABLET | Freq: Four times a day (QID) | ORAL | Status: DC | PRN
  Filled 2015-07-26: qty 2

## 2015-07-26 MED ORDER — SODIUM CHLORIDE 0.9 % IJ SOLN
3.0000 mL | Freq: Two times a day (BID) | INTRAMUSCULAR | Status: DC
Start: 2015-07-26 — End: 2015-07-31
  Administered 2015-07-26 – 2015-07-31 (×9): 3 mL via INTRAVENOUS

## 2015-07-26 MED ORDER — ALBUTEROL SULFATE (2.5 MG/3ML) 0.083% IN NEBU
2.5000 mg | INHALATION_SOLUTION | RESPIRATORY_TRACT | Status: DC
  Administered 2015-07-27 – 2015-07-29 (×16): 2.5 mg via RESPIRATORY_TRACT
  Filled 2015-07-26 (×18): qty 3

## 2015-07-26 MED ORDER — INSULIN ASPART 100 UNIT/ML ~~LOC~~ SOLN: 0.0000 [IU] | Freq: Three times a day (TID) | SUBCUTANEOUS | Status: DC

## 2015-07-26 MED ORDER — INSULIN ASPART 100 UNIT/ML ~~LOC~~ SOLN: 0.0000 [IU] | Freq: Every day | SUBCUTANEOUS | Status: DC

## 2015-07-26 MED ORDER — LOPERAMIDE HCL 2 MG PO CAPS: 2.0000 mg | ORAL_CAPSULE | Freq: Four times a day (QID) | ORAL | Status: DC | PRN

## 2015-07-26 MED ORDER — ATORVASTATIN CALCIUM 20 MG PO TABS
40.0000 mg | ORAL_TABLET | Freq: Every day | ORAL | Status: DC
  Administered 2015-07-26 – 2015-07-30 (×5): 40 mg via ORAL
  Filled 2015-07-26 (×5): qty 2

## 2015-07-26 MED ORDER — PSYLLIUM 95 % PO PACK
1.0000 | PACK | Freq: Every day | ORAL | Status: DC
  Administered 2015-07-27 – 2015-07-28 (×2): 1 via ORAL
  Administered 2015-07-29: 08:00:00 via ORAL
  Administered 2015-07-30 – 2015-07-31 (×2): 1 via ORAL
  Filled 2015-07-26 (×6): qty 1

## 2015-07-26 MED ORDER — ACETAMINOPHEN 325 MG PO TABS
650.0000 mg | ORAL_TABLET | ORAL | Status: DC | PRN
  Administered 2015-07-26: 650 mg via ORAL

## 2015-07-26 MED ORDER — VITAMINS A & D EX OINT
1.0000 "application " | TOPICAL_OINTMENT | Freq: Two times a day (BID) | CUTANEOUS | Status: DC | PRN
  Administered 2015-07-28 – 2015-07-29 (×2): 1 via TOPICAL
  Filled 2015-07-26: qty 113

## 2015-07-26 MED ORDER — DEXTROSE 5 % IV SOLN
1.0000 g | INTRAVENOUS | Status: DC
  Administered 2015-07-27: 20:00:00 1 g via INTRAVENOUS
  Filled 2015-07-26: qty 10

## 2015-07-26 MED ORDER — BISACODYL 5 MG PO TBEC: 5.0000 mg | DELAYED_RELEASE_TABLET | Freq: Every day | ORAL | Status: DC | PRN

## 2015-07-26 MED ORDER — VANCOMYCIN HCL IN DEXTROSE 1-5 GM/200ML-% IV SOLN
1000.0000 mg | Freq: Once | INTRAVENOUS | Status: AC
  Administered 2015-07-26: 1000 mg via INTRAVENOUS
  Filled 2015-07-26: qty 200

## 2015-07-26 MED ORDER — LEVOTHYROXINE SODIUM 75 MCG PO TABS
75.0000 ug | ORAL_TABLET | Freq: Every day | ORAL | Status: DC
  Administered 2015-07-27 – 2015-07-31 (×5): 75 ug via ORAL
  Filled 2015-07-26 (×7): qty 1

## 2015-07-26 MED ORDER — VITAMIN D (ERGOCALCIFEROL) 1.25 MG (50000 UNIT) PO CAPS: 50000.0000 [IU] | ORAL_CAPSULE | ORAL | Status: DC

## 2015-07-26 MED ORDER — ALUM & MAG HYDROXIDE-SIMETH 200-200-20 MG/5ML PO SUSP: 30.0000 mL | ORAL | Status: DC | PRN

## 2015-07-26 MED ORDER — SODIUM CHLORIDE 0.9 % IV BOLUS (SEPSIS)
1000.0000 mL | INTRAVENOUS | Status: AC
  Administered 2015-07-26 (×2): 1000 mL via INTRAVENOUS

## 2015-07-26 MED ORDER — DIVALPROEX SODIUM ER 250 MG PO TB24
250.0000 mg | ORAL_TABLET | Freq: Every day | ORAL | Status: DC
  Administered 2015-07-26 – 2015-07-30 (×5): 250 mg via ORAL
  Filled 2015-07-26 (×5): qty 1
  Filled 2015-07-26: qty 3
  Filled 2015-07-26: qty 4

## 2015-07-26 MED ORDER — METOPROLOL SUCCINATE ER 50 MG PO TB24
50.0000 mg | ORAL_TABLET | Freq: Every day | ORAL | Status: DC
  Administered 2015-07-27 – 2015-07-29 (×3): 50 mg via ORAL
  Filled 2015-07-26 (×4): qty 1

## 2015-07-26 MED ORDER — ONDANSETRON HCL 4 MG/2ML IJ SOLN: 4.0000 mg | Freq: Four times a day (QID) | INTRAMUSCULAR | Status: DC | PRN

## 2015-07-26 MED ORDER — GABAPENTIN 600 MG PO TABS
600.0000 mg | ORAL_TABLET | Freq: Every day | ORAL | Status: DC
  Administered 2015-07-26 – 2015-07-30 (×5): 600 mg via ORAL
  Filled 2015-07-26 (×5): qty 1

## 2015-07-26 MED ORDER — OLANZAPINE 10 MG PO TABS
20.0000 mg | ORAL_TABLET | Freq: Every day | ORAL | Status: DC
  Administered 2015-07-26 – 2015-07-30 (×5): 20 mg via ORAL
  Filled 2015-07-26: qty 8
  Filled 2015-07-26: qty 2
  Filled 2015-07-26: qty 8
  Filled 2015-07-26 (×4): qty 2

## 2015-07-26 MED ORDER — ONDANSETRON HCL 4 MG PO TABS: 4.0000 mg | ORAL_TABLET | Freq: Four times a day (QID) | ORAL | Status: DC | PRN

## 2015-07-26 MED ORDER — ACETAMINOPHEN 650 MG RE SUPP
650.0000 mg | Freq: Four times a day (QID) | RECTAL | Status: DC | PRN
Start: 2015-07-26 — End: 2015-07-31

## 2015-07-26 MED ORDER — ASPIRIN EC 81 MG PO TBEC
81.0000 mg | DELAYED_RELEASE_TABLET | Freq: Every day | ORAL | Status: DC
  Administered 2015-07-27 – 2015-07-31 (×5): 81 mg via ORAL
  Filled 2015-07-26 (×5): qty 1

## 2015-07-26 MED ORDER — SODIUM CHLORIDE 0.9 % IV SOLN
INTRAVENOUS | Status: DC
  Administered 2015-07-26 – 2015-07-27 (×3): via INTRAVENOUS

## 2015-07-26 MED ORDER — ENSURE ENLIVE PO LIQD
237.0000 mL | Freq: Two times a day (BID) | ORAL | Status: DC
  Administered 2015-07-27 – 2015-07-31 (×9): 237 mL via ORAL

## 2015-07-26 MED ORDER — DIVALPROEX SODIUM ER 500 MG PO TB24
1000.0000 mg | ORAL_TABLET | Freq: Every day | ORAL | Status: DC
  Administered 2015-07-26 – 2015-07-30 (×5): 1000 mg via ORAL
  Filled 2015-07-26: qty 2
  Filled 2015-07-26: qty 4
  Filled 2015-07-26 (×2): qty 2
  Filled 2015-07-26: qty 4

## 2015-07-26 MED ORDER — HEPARIN SODIUM (PORCINE) 5000 UNIT/ML IJ SOLN
5000.0000 [IU] | Freq: Three times a day (TID) | INTRAMUSCULAR | Status: DC
  Administered 2015-07-26 – 2015-07-28 (×5): 5000 [IU] via SUBCUTANEOUS
  Filled 2015-07-26 (×5): qty 1

## 2015-07-26 MED ORDER — CADEXOMER IODINE 0.9 % EX GEL: 1.0000 "application " | Freq: Every day | CUTANEOUS | Status: DC | PRN

## 2015-07-26 MED ORDER — METHOCARBAMOL 500 MG PO TABS
500.0000 mg | ORAL_TABLET | Freq: Four times a day (QID) | ORAL | Status: DC | PRN
  Filled 2015-07-26: qty 1

## 2015-07-26 MED ORDER — FERROUS SULFATE 325 (65 FE) MG PO TABS
325.0000 mg | ORAL_TABLET | Freq: Every day | ORAL | Status: DC
  Administered 2015-07-27 – 2015-07-31 (×5): 325 mg via ORAL
  Filled 2015-07-26 (×5): qty 1

## 2015-07-26 MED ORDER — DEXTROSE 5 % IV SOLN
1.0000 g | Freq: Once | INTRAVENOUS | Status: AC
  Administered 2015-07-26: 1 g via INTRAVENOUS
  Filled 2015-07-26: qty 10

## 2015-07-26 NOTE — ED Notes (Signed)
Per EMS patient comes to [facility] from [facility]. There he was being seen for wound care for the past 4 weeks. Patient states he has had these wounds "for about 3 months." At [facility], patients temp was 101.5 and BP 73/47. They gave him a 533m bolusBPup to 119/80. Upon EMS arrival, patients BP was back down to 75 /47. EMS gave 350 mL bolus. BP currently 139/78. Patient has three wound, Right upper thigh, right heel, and left ankle. Patient has condom catheter, he states, "they gave me that because I was peeing at night." Patient in no distress at this time. ST on monitor. Temp 98.7 at this time.

## 2015-07-26 NOTE — ED Notes (Addendum)
Dr. Cinda Quest notified of critical lab value. Lactic Acid 2.9

## 2015-07-26 NOTE — ED Provider Notes (Signed)
[facility] Emergency Department Provider Note  ____________________________________________  Time seen: Approximately 5:12 PM  I have reviewed the triage vital signs and the nursing notes.   HISTORY  Chief Complaint Wound Infection    HPI Marcus [name] is a 69 y.o. male course patient sent from care home for fever 101.5 this morning. Patient reports he has a slight cough. Patient says he thinks his decubiti on his sacrum were infected. Patient denies any vomiting, does not appear to be productive he does not have any belly pain urinary frequency or urgency. He does have an indwelling Foley however. He is unsure as to when the fever started. Nothing seems to make the fever better or worse and are no other associated symptoms.   Past Medical History  Diagnosis Date  . Diabetes ([place])   . Vascular dementia   . Bipolar 1 disorder ([place])   . Stroke ([place]) 1998  . Heart disease   . CAD (coronary artery disease)   . Hypertension   . Shortness of breath dyspnea   . Hypothyroidism   . Tuberculosis 2015  . Cancer [facility])     Patient Active Problem List   Diagnosis Date Noted  . Infection of urinary tract 06/09/2015  . Fever presenting with conditions classified elsewhere 06/09/2015  . Anemia 06/09/2015  . Hypernatremia 06/09/2015  . Lung cancer ([place]) 06/09/2015  . Encephalopathy, metabolic 09/60/4540  . Closed left hip fracture ([place]) 06/06/2015  . Malignant neoplasm of upper lobe of left lung ([place])   . History of chest tube placement   . Pneumothorax after biopsy 04/20/2015    Past Surgical History  Procedure Laterality Date  . Cardiac bypass    . Coronary artery bypass graft    . Appendectomy    . Compression hip screw Left 06/07/2015    Procedure: left hip nailing with DHS;  Surgeon: Earnestine Leys, MD;  Location: [facility] ORS;  Service: Orthopedics;  Laterality: Left;    Current Outpatient Rx  Name  Route  Sig  Dispense  Refill  . acetaminophen  (TYLENOL) 325 MG tablet   Oral   Take 650 mg by mouth every 4 (four) hours as needed for mild pain, fever or headache.          . albuterol (PROVENTIL) (2.5 MG/3ML) 0.083% nebulizer solution   Nebulization   Take 3 mLs (2.5 mg total) by nebulization every 4 (four) hours.   75 mL   12   . alum & mag hydroxide-simeth (MAALOX/MYLANTA) 200-200-20 MG/5ML suspension   Oral   Take 30 mLs by mouth every 4 (four) hours as needed for indigestion.   355 mL   0   . aspirin EC 81 MG tablet   Oral   Take 81 mg by mouth daily.         Marland Kitchen atorvastatin (LIPITOR) 40 MG tablet   Oral   Take 40 mg by mouth at bedtime.         . bisacodyl (DULCOLAX) 5 MG EC tablet   Oral   Take 1 tablet (5 mg total) by mouth daily as needed for moderate constipation.   30 tablet   0   . dextromethorphan (DELSYM) 30 MG/5ML liquid   Oral   Take 5 mLs (30 mg total) by mouth 2 (two) times daily as needed for cough.   89 mL   0   . divalproex (DEPAKOTE ER) 250 MG 24 hr tablet   Oral   Take 250 mg  by mouth at bedtime. Pt takes with two '500mg'$  tablets.         . divalproex (DEPAKOTE ER) 500 MG 24 hr tablet   Oral   Take 1,000 mg by mouth at bedtime.         . docusate sodium (COLACE) 100 MG capsule   Oral   Take 1 capsule (100 mg total) by mouth 2 (two) times daily.   10 capsule   0   . feeding supplement, ENSURE ENLIVE, (ENSURE ENLIVE) LIQD   Oral   Take 237 mLs by mouth 2 (two) times daily between meals.   237 mL   12   . ferrous sulfate 325 (65 FE) MG tablet   Oral   Take 1 tablet (325 mg total) by mouth daily with breakfast.   30 tablet   3   . gabapentin (NEURONTIN) 600 MG tablet   Oral   Take 600 mg by mouth at bedtime.         Marland Kitchen guaiFENesin (MUCINEX) 600 MG 12 hr tablet   Oral   Take 1 tablet (600 mg total) by mouth 2 (two) times daily as needed for cough.   20 tablet   0   . HYDROcodone-acetaminophen (NORCO/VICODIN) 5-325 MG per tablet   Oral   Take 1-2 tablets by mouth  every 6 (six) hours as needed for moderate pain.   30 tablet   0   . insulin aspart (NOVOLOG) 100 UNIT/ML injection   Subcutaneous   Inject 0-5 Units into the skin at bedtime.   10 mL   11   . insulin aspart (NOVOLOG) 100 UNIT/ML injection   Subcutaneous   Inject 0-9 Units into the skin 3 (three) times daily with meals.   10 mL   11   . levothyroxine (SYNTHROID, LEVOTHROID) 75 MCG tablet   Oral   Take 75 mcg by mouth daily before breakfast.         . loperamide (IMODIUM A-D) 2 MG tablet   Oral   Take 2 mg by mouth 4 (four) times daily as needed for diarrhea or loose stools.         . magnesium hydroxide (MILK OF MAGNESIA) 400 MG/5ML suspension   Oral   Take 30 mLs by mouth daily as needed for mild constipation.   360 mL   0   . menthol-cetylpyridinium (CEPACOL) 3 MG lozenge   Oral   Take 1 lozenge (3 mg total) by mouth as needed for sore throat (sore throat).   100 tablet   12   . methocarbamol (ROBAXIN) 500 MG tablet   Oral   Take 1 tablet (500 mg total) by mouth every 6 (six) hours as needed for muscle spasms.   60 tablet   0   . metoprolol succinate (TOPROL-XL) 50 MG 24 hr tablet   Oral   Take 50 mg by mouth daily.          Marland Kitchen OLANZapine (ZYPREXA) 20 MG tablet   Oral   Take 20 mg by mouth at bedtime.         Marland Kitchen oxyCODONE (OXY IR/ROXICODONE) 5 MG immediate release tablet   Oral   Take 1 tablet (5 mg total) by mouth every 4 (four) hours as needed for moderate pain.   30 tablet   0   . psyllium (REGULOID) 0.52 G capsule   Oral   Take 0.52 g by mouth daily.         Marland Kitchen senna (  SENOKOT) 8.6 MG TABS tablet   Oral   Take 1 tablet (8.6 mg total) by mouth 2 (two) times daily.   120 each   0   . Vitamin D, Ergocalciferol, (DRISDOL) 50000 UNITS CAPS capsule   Oral   Take 50,000 Units by mouth every 30 (thirty) days.         . Vitamins A & D (VITAMIN A & D) ointment   Topical   Apply 1 application topically 2 (two) times daily as needed  (barrier).           Allergies Review of patient's allergies indicates no known allergies.  Family History  Problem Relation Age of Onset  . Parkinson's disease Father     Social History Social History  Substance Use Topics  . Smoking status: Former Smoker -- 0.50 packs/day for 58 years    Types: Cigarettes    Quit date: 06/05/2015  . Smokeless tobacco: Former Systems developer    Types: Snuff     Comment: used snuff in [employer]; uses cigarettes and cigars  . Alcohol Use: No    Review of Systems Constitutional:fever/chills Eyes: No visual changes. ENT: No sore throat. Cardiovascular: Denies chest pain. Respiratory: Denies shortness of breath. Gastrointestinal: No abdominal pain.  No nausea, no vomiting.  No diarrhea.  No constipation. Genitourinary: Negative for dysuria. Musculoskeletal: Negative for back pain. Skin: Negative for rash. Neurological: Negative for headaches, focal weakness or numbness.  10-point ROS otherwise negative.  ____________________________________________   PHYSICAL EXAM:  VITAL SIGNS: ED Triage Vitals  Enc Vitals Group     BP 07/26/15 1653 139/78 mmHg     Pulse Rate 07/26/15 1653 112     Resp 07/26/15 1653 21     Temp 07/26/15 1653 98.7 F (37.1 C)     Temp Source 07/26/15 1653 Oral     SpO2 07/26/15 1653 100 %     Weight 07/26/15 1653 129 lb (58.514 kg)     Height 07/26/15 1653 '5\' 11"'$  (1.803 m)     Head Cir --      Peak Flow --      Pain Score --      Pain Loc --      Pain Edu? --      Excl. in [place]? --     Constitutional: Alert and oriented. Well appearing and in no acute distress. Eyes: Conjunctivae are normal. PERRL. EOMI. Head: Atraumatic. Nose: No congestion/rhinnorhea. Mouth/Throat: Mucous membranes are moist.  Oropharynx non-erythematous. Neck: No stridor. Cardiovascular: Normal rate, regular rhythm. Grossly normal heart sounds.  Good peripheral circulation. Respiratory: Normal respiratory effort.  No retractions. Lungs  CTAB. Gastrointestinal: Soft and nontender. No distention. No abdominal bruits. No CVA tenderness. Musculoskeletal: No lower extremity tenderness nor edema.  No joint effusions. Neurologic:  Normal speech and language. No gross focal neurologic deficits are appreciated. No gait instability. Skin:  Skin is warm, dry and intact. No rash noted. Patient has a stage II decubitus over the sacrum and both sides of the buttocks right next to the sacrum. The surrounding skin looks erythematous. There is no foul odor. She seems to be little bit of brown discharge.  ____________________________________________   LABS (all labs ordered are listed, but only abnormal results are displayed)  Labs Reviewed  COMPREHENSIVE METABOLIC PANEL - Abnormal; Notable for the following:    Glucose, Bld 146 (*)    Creatinine, Ser 1.38 (*)    Calcium 8.7 (*)    Total Protein 6.3 (*)  Albumin 2.3 (*)    ALT 15 (*)    Alkaline Phosphatase 187 (*)    GFR calc non Af Amer 51 (*)    GFR calc Af Amer 59 (*)    All other components within normal limits  CBC WITH DIFFERENTIAL/PLATELET - Abnormal; Notable for the following:    RBC 3.26 (*)    Hemoglobin 10.5 (*)    HCT 33.0 (*)    MCV 101.2 (*)    MCHC 31.8 (*)    RDW 17.3 (*)    Neutro Abs 7.7 (*)    Lymphs Abs 0.5 (*)    All other components within normal limits  LACTIC ACID, PLASMA - Abnormal; Notable for the following:    Lactic Acid, Venous 2.9 (*)    All other components within normal limits  CULTURE, BLOOD (ROUTINE X 2)  CULTURE, BLOOD (ROUTINE X 2)  URINE CULTURE  VALPROIC ACID LEVEL  LACTIC ACID, PLASMA  URINALYSIS COMPLETEWITH MICROSCOPIC ([facility] ONLY)   ____________________________________________  EKG  EKG read by me shows sinus rhythm at approximately 100 bpm left axis incomplete right bundle-branch block 2 PVCs no acute ST-T wave changes similar to an EKG from September this  year. ____________________________________________  RADIOLOGY  EKG read by radiology as COPD with chronic left upper lobe scarring. I reviewed the films and agree. ____________________________________________   PROCEDURES  Sepsis orders placed fluids are going lactic comes back as 2.9 discussed the patient with Dr. Berton Lan he will admit the patient source is either urine or his decubitus ____________________________________________   INITIAL IMPRESSION / [place] / ED COURSE  Pertinent labs & imaging results that were available during my care of the patient were reviewed by me and considered in my medical decision making (see chart for details).   __Critical care time 30 minutes __________________________________________   FINAL CLINICAL IMPRESSION(S) / ED DIAGNOSES  Final diagnoses:  SIRS (systemic inflammatory response syndrome) ([place])      Nena Polio, MD 07/26/15 1850

## 2015-07-26 NOTE — H&P (Addendum)
[place] [facility] [place] NAME: Marcus [name]    MR#:  [id number]  DATE OF BIRTH:  Feb 12, 1946  DATE OF ADMISSION:  07/26/2015  PRIMARY CARE PHYSICIAN: [name], MD   REQUESTING/REFERRING PHYSICIAN: Dr. Conni Slipper  CHIEF COMPLAINT:   Chief Complaint  Patient presents with  . Wound Infection   sepsis  HISTORY OF PRESENT ILLNESS:  Marcus [name]  is a 69 y.o. male with a known history of diabetes type 2, dementia, history of bipolar disorder, previous CVA, hypothyroidism, hypertension who presents to the hospital from the [facility] healthcare due to fever 101 and not feeling well. Patient presented to the hospital and was started on sepsis protocol as he was tachycardic, and noted to have elevated lactate level of 2.9. Initially the source of his infection was thought to be his sacral decubitus. Patient complains of burning to the lower part of his buttocks, admits to a cough which is nonproductive, no abdominal pain, nausea, vomiting, chills or any other associated symptoms. Hospitalist services were contacted for further treatment and evaluation.  PAST MEDICAL HISTORY:   Past Medical History  Diagnosis Date  . Diabetes ([place])   . Vascular dementia   . Bipolar 1 disorder ([place])   . Stroke ([place]) 1998  . Heart disease   . CAD (coronary artery disease)   . Hypertension   . Shortness of breath dyspnea   . Hypothyroidism   . Tuberculosis 2015  . Cancer [facility])     PAST SURGICAL HISTORY:   Past Surgical History  Procedure Laterality Date  . Cardiac bypass    . Coronary artery bypass graft    . Appendectomy    . Compression hip screw Left 06/07/2015    Procedure: left hip nailing with DHS;  Surgeon: Earnestine Leys, MD;  Location: [facility] ORS;  Service: Orthopedics;  Laterality: Left;    SOCIAL HISTORY:   Social History  Substance Use Topics  . Smoking status: Former Smoker -- 0.50 packs/day for 58 years    Types:  Cigarettes    Quit date: 06/05/2015  . Smokeless tobacco: Former Systems developer    Types: Snuff     Comment: used snuff in [employer]; uses cigarettes and cigars  . Alcohol Use: No    FAMILY HISTORY:   Family History  Problem Relation Age of Onset  . Parkinson's disease Father   . Heart disease Mother     DRUG ALLERGIES:  No Known Allergies  REVIEW OF SYSTEMS:   Review of Systems  Constitutional: Positive for fever. Negative for weight loss.  HENT: Negative for congestion, nosebleeds and tinnitus.   Eyes: Negative for blurred vision, double vision and redness.  Respiratory: Positive for cough. Negative for hemoptysis and shortness of breath.   Cardiovascular: Negative for chest pain, orthopnea, leg swelling and PND.  Gastrointestinal: Negative for nausea, vomiting, abdominal pain, diarrhea and melena.  Genitourinary: Negative for dysuria, urgency and hematuria.  Musculoskeletal: Negative for joint pain and falls.  Neurological: Positive for weakness. Negative for dizziness, tingling, sensory change, focal weakness, seizures and headaches.  Endo/Heme/Allergies: Negative for polydipsia. Does not bruise/bleed easily.  Psychiatric/Behavioral: Negative for depression and memory loss. The patient is not nervous/anxious.     MEDICATIONS AT HOME:   Prior to Admission medications   Medication Sig Start Date End Date Taking? Authorizing Provider  acetaminophen (TYLENOL) 325 MG tablet Take 650 mg by mouth every 4 (four) hours as needed for mild pain, fever or headache.  Yes Historical Provider, MD  albuterol (PROVENTIL) (2.5 MG/3ML) 0.083% nebulizer solution Take 3 mLs (2.5 mg total) by nebulization every 4 (four) hours. 06/09/15  Yes Theodoro Grist, MD  alum & mag hydroxide-simeth (MAALOX/MYLANTA) 200-200-20 MG/5ML suspension Take 30 mLs by mouth every 4 (four) hours as needed for indigestion. 06/09/15  Yes Theodoro Grist, MD  aspirin EC 81 MG tablet Take 81 mg by mouth daily.   Yes Historical  Provider, MD  atorvastatin (LIPITOR) 40 MG tablet Take 40 mg by mouth at bedtime.   Yes Historical Provider, MD  bisacodyl (DULCOLAX) 5 MG EC tablet Take 1 tablet (5 mg total) by mouth daily as needed for moderate constipation. 06/09/15  Yes Theodoro Grist, MD  cadexomer iodine (IODOSORB) 0.9 % gel Apply 1 application topically daily as needed for wound care (use with dressing changes every other day and as needed).   Yes Historical Provider, MD  dextromethorphan (DELSYM) 30 MG/5ML liquid Take 5 mLs (30 mg total) by mouth 2 (two) times daily as needed for cough. 06/09/15  Yes Theodoro Grist, MD  divalproex (DEPAKOTE ER) 250 MG 24 hr tablet Take 250 mg by mouth at bedtime. Pt takes with two 539m tablets.   Yes Historical Provider, MD  divalproex (DEPAKOTE ER) 500 MG 24 hr tablet Take 1,000 mg by mouth at bedtime.   Yes Historical Provider, MD  doxycycline (VIBRA-TABS) 100 MG tablet Take 100 mg by mouth 2 (two) times daily. 07/26/15 08/05/15 Yes Historical Provider, MD  feeding supplement, ENSURE ENLIVE, (ENSURE ENLIVE) LIQD Take 237 mLs by mouth 2 (two) times daily between meals. 06/09/15  Yes RTheodoro Grist MD  ferrous sulfate 325 (65 FE) MG tablet Take 1 tablet (325 mg total) by mouth daily with breakfast. 06/09/15  Yes RTheodoro Grist MD  gabapentin (NEURONTIN) 600 MG tablet Take 600 mg by mouth at bedtime.   Yes Historical Provider, MD  levofloxacin (LEVAQUIN) 500 MG tablet Take 500 mg by mouth daily. 07/26/15 07/31/15 Yes Historical Provider, MD  levothyroxine (SYNTHROID, LEVOTHROID) 75 MCG tablet Take 75 mcg by mouth daily before breakfast.   Yes Historical Provider, MD  loperamide (IMODIUM A-D) 2 MG tablet Take 2 mg by mouth 4 (four) times daily as needed for diarrhea or loose stools.   Yes Historical Provider, MD  magnesium hydroxide (MILK OF MAGNESIA) 400 MG/5ML suspension Take 30 mLs by mouth daily as needed for mild constipation. 06/09/15  Yes RTheodoro Grist MD  methocarbamol (ROBAXIN) 500 MG tablet  Take 1 tablet (500 mg total) by mouth every 6 (six) hours as needed for muscle spasms. 06/09/15  Yes RTheodoro Grist MD  metoprolol succinate (TOPROL-XL) 50 MG 24 hr tablet Take 50 mg by mouth daily.    Yes Historical Provider, MD  OLANZapine (ZYPREXA) 20 MG tablet Take 20 mg by mouth at bedtime.   Yes Historical Provider, MD  psyllium (REGULOID) 0.52 G capsule Take 0.52 g by mouth daily.   Yes Historical Provider, MD  senna (SENOKOT) 8.6 MG TABS tablet Take 1 tablet (8.6 mg total) by mouth 2 (two) times daily. 06/09/15  Yes RTheodoro Grist MD  Vitamin D, Ergocalciferol, (DRISDOL) 50000 UNITS CAPS capsule Take 50,000 Units by mouth every 30 (thirty) days.   Yes Historical Provider, MD  Vitamins A & D (VITAMIN A & D) ointment Apply 1 application topically 2 (two) times daily as needed (barrier).   Yes Historical Provider, MD  insulin aspart (NOVOLOG) 100 UNIT/ML injection Inject 0-5 Units into the skin at bedtime. 06/09/15  Theodoro Grist, MD  insulin aspart (NOVOLOG) 100 UNIT/ML injection Inject 0-9 Units into the skin 3 (three) times daily with meals. 06/09/15   Theodoro Grist, MD      VITAL SIGNS:  Blood pressure 127/95, pulse 300, temperature 98.7 F (37.1 C), temperature source Oral, resp. rate 19, height 5' 11"  (1.803 m), weight 58.514 kg (129 lb), SpO2 97 %.  PHYSICAL EXAMINATION:  Physical Exam  GENERAL:  69 y.o.-year-old patient lying in the bed with no acute distress.  EYES: Pupils equal, round, reactive to light and accommodation. No scleral icterus. Extraocular muscles intact.  HEENT: Head atraumatic, normocephalic. Oropharynx and nasopharynx clear. No oropharyngeal erythema, moist oral mucosa  NECK:  Supple, no jugular venous distention. No thyroid enlargement, no tenderness.  LUNGS: Normal breath sounds bilaterally, no wheezing, rales, rhonchi. No use of accessory muscles of respiration.  CARDIOVASCULAR: S1, S2 RRR. No murmurs, rubs, gallops, clicks.  ABDOMEN: Soft, nontender,  nondistended. Bowel sounds present. No organomegaly or mass.  EXTREMITIES: No pedal edema, cyanosis, or clubbing. + 2 pedal & radial pulses b/l.   NEUROLOGIC: Cranial nerves II through XII are intact. No focal Motor or sensory deficits appreciated b/l.  Globally weak.   PSYCHIATRIC: The patient is alert and oriented x 3. Good affect.  SKIN: No obvious rash, lesion, positive stage I decubitus ulcer without any drainage.  LABORATORY PANEL:   CBC  Recent Labs Lab 07/26/15 1708  WBC 8.8  HGB 10.5*  HCT 33.0*  PLT 299   ------------------------------------------------------------------------------------------------------------------  Chemistries   Recent Labs Lab 07/26/15 1708  NA 139  K 4.4  CL 107  CO2 25  GLUCOSE 146*  BUN 20  CREATININE 1.38*  CALCIUM 8.7*  AST 26  ALT 15*  ALKPHOS 187*  BILITOT 0.4   ------------------------------------------------------------------------------------------------------------------  Cardiac Enzymes No results for input(s): TROPONINI in the last 168 hours. ------------------------------------------------------------------------------------------------------------------  RADIOLOGY:  Dg Chest Port 1 View  07/26/2015  CLINICAL DATA:  Code sepsis, sacral ulcer, personal history of diabetes mellitus, stroke, hypertension, coronary artery disease, dementia, TB, lung cancer EXAM: PORTABLE CHEST 1 VIEW COMPARISON:  Portable exam 1712 hours compared to 06/06/2015 FINDINGS: Normal heart size post CABG. Mediastinal contours and pulmonary vascularity normal. Lungs emphysematous with minimal chronic accentuation of LEFT upper lobe markings stable. No acute infiltrate, pleural effusion or pneumothorax. Bones demineralized. IMPRESSION: COPD changes with minimal atelectasis or scarring LEFT upper lobe. No acute abnormalities. Electronically Signed   By: [name]   On: 07/26/2015 17:27     IMPRESSION AND PLAN:   69 year old male with past  medical history of diabetes, hypertension, hypothyroidism, history of Crohn's disease status post bypass, previous CVA, bipolar disorder who presented to the hospital due to fever and malaise and suspected to have sepsis.  #1 sepsis-patient met criteria given his tachycardia, fever, lactic acidosis. -The source is clinically unclear. Unlikely its his sacral decubitus. I'm assuming is probably a urinary tract infection but urinalysis is pending. -Chest x-ray does not show any evidence of acute pneumonia. I will treat the patient with IV ceftriaxone and follow urine, blood cultures. Follow fever curve.  #2 diabetes type 2 without complication-continue NovoLog sliding scale.  #3 diabetic neuropathy-continue Neurontin.  #4 bipolar disorder-continue Depakote.  #5 hypothyroidism-continue Synthroid.  #6 hypertension-continue metoprolol.  #7 stage I-2 decubitus ulcer-continue local wound care.   All the records are reviewed and case discussed with ED provider. Management plans discussed with the patient, family and they are in agreement.  CODE STATUS: DO NOT  RESUSCITATE  TOTAL TIME TAKING CARE OF THIS PATIENT: 45 minutes.    Henreitta Leber M.D on 07/26/2015 at 7:32 PM  Between 7am to 6pm - Pager - [phone]  After 6pm go to [email or web address] - password [email or web address] [facility]  [place] [facility]  Office  [phone]  CC: Primary care physician; [name], MD

## 2015-07-27 LAB — BASIC METABOLIC PANEL
Anion gap: 7 (ref 5–15)
BUN: 14 mg/dL (ref 6–20)
CO2: 25 mmol/L (ref 22–32)
Calcium: 8.5 mg/dL — ABNORMAL LOW (ref 8.9–10.3)
Chloride: 109 mmol/L (ref 101–111)
Creatinine, Ser: 1.1 mg/dL (ref 0.61–1.24)
GFR calc Af Amer: >60 mL/min (ref >60)
GFR calc non Af Amer: >60 mL/min (ref >60)
Glucose, Bld: 121 mg/dL — ABNORMAL HIGH (ref 65–99)
Potassium: 3.9 mmol/L (ref 3.5–5.1)
Sodium: 141 mmol/L (ref 135–145)

## 2015-07-27 LAB — CBC
HCT: 32.5 % — ABNORMAL LOW (ref 40.0–52.0)
Hemoglobin: 10.1 g/dL — ABNORMAL LOW (ref 13.0–18.0)
MCH: 32.6 pg (ref 26.0–34.0)
MCHC: 31.1 g/dL — ABNORMAL LOW (ref 32.0–36.0)
MCV: 104.6 fL — AB (ref 80.0–100.0)
PLATELETS: 221 10*3/uL (ref 150–440)
RBC: 3.1 MIL/uL — ABNORMAL LOW (ref 4.40–5.90)
RDW: 17.4 % — AB (ref 11.5–14.5)
WBC: 7 10*3/uL (ref 3.8–10.6)

## 2015-07-27 LAB — GLUCOSE, CAPILLARY
GLUCOSE-CAPILLARY: 183 mg/dL — AB (ref 65–99)
GLUCOSE-CAPILLARY: 182 mg/dL — AB (ref 65–99)
Glucose-Capillary: 112 mg/dL — ABNORMAL HIGH (ref 65–99)
Glucose-Capillary: 228 mg/dL — ABNORMAL HIGH (ref 65–99)

## 2015-07-27 LAB — MRSA PCR SCREENING: MRSA by PCR: NEGATIVE

## 2015-07-27 MED ORDER — INSULIN ASPART 100 UNIT/ML ~~LOC~~ SOLN
0.0000 [IU] | Freq: Three times a day (TID) | SUBCUTANEOUS | Status: DC
  Administered 2015-07-27: 17:00:00 3 [IU] via SUBCUTANEOUS
  Administered 2015-07-28 (×2): 2 [IU] via SUBCUTANEOUS
  Administered 2015-07-29: 17:00:00 3 [IU] via SUBCUTANEOUS
  Administered 2015-07-29: 2 [IU] via SUBCUTANEOUS
  Administered 2015-07-29 – 2015-07-30 (×2): 1 [IU] via SUBCUTANEOUS
  Administered 2015-07-30 (×2): 2 [IU] via SUBCUTANEOUS
  Administered 2015-07-31: 13:00:00 9 [IU] via SUBCUTANEOUS
  Filled 2015-07-27 (×2): qty 2
  Filled 2015-07-27: qty 1
  Filled 2015-07-27: qty 3
  Filled 2015-07-27 (×4): qty 2
  Filled 2015-07-27: qty 9
  Filled 2015-07-27: qty 2
  Filled 2015-07-27: qty 3
  Filled 2015-07-27: qty 1

## 2015-07-27 MED ORDER — INSULIN ASPART 100 UNIT/ML ~~LOC~~ SOLN
2.0000 [IU] | Freq: Once | SUBCUTANEOUS | Status: AC
  Administered 2015-07-27: 13:00:00 2 [IU] via SUBCUTANEOUS

## 2015-07-27 MED ORDER — INSULIN ASPART 100 UNIT/ML ~~LOC~~ SOLN: 0.0000 [IU] | Freq: Every day | SUBCUTANEOUS | Status: DC

## 2015-07-27 NOTE — Care Management Important Message (Signed)
Important Message  Patient Details  Name: Marcus [name] MRN: [id number] Date of Birth: Feb 06, 1946   Medicare Important Message Given:  Yes    Shelbie Ammons, RN 07/27/2015, 10:45 AM

## 2015-07-27 NOTE — Progress Notes (Signed)
[facility] [facility] [place] NAME: Marcus [name]    MR#:  [id number]  DATE OF BIRTH:  04/15/1946  SUBJECTIVE:  CHIEF COMPLAINT:   Chief Complaint  Patient presents with  . Wound Infection   Feeling well. Eating breakfast. Denies pain  REVIEW OF SYSTEMS:   Review of Systems  Constitutional: Negative for fever.  Respiratory: Negative for shortness of breath.   Cardiovascular: Negative for chest pain and palpitations.  Gastrointestinal: Negative for nausea, vomiting and abdominal pain.  Genitourinary: Negative for dysuria.   DRUG ALLERGIES:  No Known Allergies  VITALS:  Blood pressure 101/65, pulse 98, temperature 97.5 F (36.4 C), temperature source Oral, resp. rate 18, height '5\' 11"'$  (1.803 m), weight 56.7 kg (125 lb), SpO2 94 %.  PHYSICAL EXAMINATION:  GENERAL:  69 y.o.-year-old patient lying in the bed with no acute distress. Thin  LUNGS: Normal breath sounds bilaterally, no wheezing, rales, rhonchi or crepitation. No use of accessory muscles of respiration.  CARDIOVASCULAR: S1, S2 normal. No murmurs, rubs, or gallops.  ABDOMEN: Soft, nontender, nondistended. Bowel sounds present. No organomegaly or mass. No guarding no rebound EXTREMITIES: No pedal edema, cyanosis, or clubbing.  NEUROLOGIC: Cranial nerves II through XII are intact. Muscle strength 4+ /5 in all extremities. Sensation intact. Gait not checked.  PSYCHIATRIC: The patient is alert and oriented x 3.  SKIN: Multiple ulcers over the lower legs and sacrum  LABORATORY PANEL:   CBC  Recent Labs Lab 07/27/15 0625  WBC 7.0  HGB 10.1*  HCT 32.5*  PLT 221   ------------------------------------------------------------------------------------------------------------------  Chemistries   Recent Labs Lab 07/26/15 1708  07/27/15 0625  NA 139  --  141  K 4.4  --  3.9  CL 107  --  109  CO2 25  --  25  GLUCOSE 146*  --  121*  BUN 20  --  14  CREATININE 1.38*  <  > 1.10  CALCIUM 8.7*  --  8.5*  AST 26  --   --   ALT 15*  --   --   ALKPHOS 187*  --   --   BILITOT 0.4  --   --   < > = values in this interval not displayed. ------------------------------------------------------------------------------------------------------------------  Cardiac Enzymes No results for input(s): TROPONINI in the last 168 hours. ------------------------------------------------------------------------------------------------------------------  RADIOLOGY:  Dg Chest Port 1 View  07/26/2015  CLINICAL DATA:  Code sepsis, sacral ulcer, personal history of diabetes mellitus, stroke, hypertension, coronary artery disease, dementia, TB, lung cancer EXAM: PORTABLE CHEST 1 VIEW COMPARISON:  Portable exam 1712 hours compared to 06/06/2015 FINDINGS: Normal heart size post CABG. Mediastinal contours and pulmonary vascularity normal. Lungs emphysematous with minimal chronic accentuation of LEFT upper lobe markings stable. No acute infiltrate, pleural effusion or pneumothorax. Bones demineralized. IMPRESSION: COPD changes with minimal atelectasis or scarring LEFT upper lobe. No acute abnormalities. Electronically Signed   By: [name]   On: 07/26/2015 17:27    EKG:   Orders placed or performed during the hospital encounter of 07/26/15  . ED EKG 12-Lead  . ED EKG 12-Lead    ASSESSMENT AND PLAN:   69 year old male with past medical history of diabetes, hypertension, hypothyroidism, history of Crohn's disease status post bypass, previous CVA, bipolar disorder who presented to the hospital due to fever and malaise and suspected to have sepsis.  #1 sepsis - Resolved, no longer tachycardic, febrile or hypotensive, white count is normal - UA +, urine  culture g - rods - blood cx pending  #2 UTI - g - rods, further culture results pending - continue rocephin  #3  diabetes type 2 diabetic neuropathy - continue SSI -continue Neurontin.  #4 bipolar disorder:  stable -continue Depakote.  #5 hypothyroidism-continue Synthroid.  #6 hypertension-continue metoprolol.  #7 multiple decubitus ulcers over left and right feet left thigh and sacrum - Care per wound care team  All the records are reviewed and case discussed with Care Management/Social Workerr. Management plans discussed with the patient, family and they are in agreement.  CODE STATUS: DNR  TOTAL TIME TAKING CARE OF THIS PATIENT: 35 minutes.  Greater than 50% of time spent in care coordination and counseling. POSSIBLE D/C IN 1 DAYS, DEPENDING ON CLINICAL CONDITION.   Myrtis Ser M.D on 07/27/2015 at 3:38 PM  Between 7am to 6pm - Pager - [phone]  After 6pm go to [email or web address] - password [email or web address] [facility]  [facility]  Office  [phone]  CC: Primary care physician; [name], MD

## 2015-07-27 NOTE — Consult Note (Signed)
WOC wound consult note Reason for Consult:stage 3 pressure injury to sacrum and left ischium and unstageable pressure injury to right heel Wound type:pressure injury Pressure Ulcer POA: Yes/ Measurement:sacrum 2.5 cm x 4.5 cm x 0.5 cm denuded skin with deeper center  Incontinence associated skin damage and pressure injury Left ankle 4 cm x 3 cm x 0.2 cm Right heel 4 cm x 10 cm  Eschar Right ischium 7 cm x 4 cm x 0.3 cm Left thigh 2.5 cm x 1 cm x 0.5 cm  Wound bed:all pink nongranulating except right heel 100% eschar Drainage (amount, consistency, odor) minimal serosanguinous Periwound:intact Dressing procedure/placement/frequency:Cleanse sacral and left ischial wound with NS and pat dry.  Gently fill wound bed with calcium alginate dressing.  Cover with 4x4 gauze and secure with ABD pad and tape. Change daily.  Cleanse right heel, left ankle and left thigh with NS and pat dry.  Apply silicone border foam dressing.  Change every 3 days and PRN soilage.  Prevalon boots to offload heel pressure.  NO disposable briefs to patient.  Dermatherapy will wick moisture and promote healing to skin. Keep skin clean and dry.  Will not follow at this time.  Please re-consult if needed.  Domenic Moras RN BSN [place] Pager [phone]

## 2015-07-27 NOTE — Care Management (Signed)
Admitted to this facility with the diagnosis of sepsis/urinary tract infection. Lives with wife, Marcus [name] and son, Marcus [name] [phone]). Wife works 3rd shift at [employer] on [address] [employer]. Mr. Marcus [name] has been a member of [facility] x 4-5 years. Discharged from this facility 06/09/15 to [facility] following hip surgery. [place] in the past. Now attends [facility] 5 days a week. Sees Dr. Derrek Monaco. No Home Health in the past. Uses a rolling walker and wheelchair in the home. No falls. Good appetite.  [facility] will transport, if needed. Shelbie Ammons RN MSN CCM Care Management [phone]

## 2015-07-27 NOTE — Plan of Care (Signed)
Problem: Education: Goal: Knowledge of [facility] Education information/materials will improve Outcome: Progressing Patient education provided on room orientation and using call bell for assistance.  Education handout given to patient and family on Sepsis.  Education also provided on issues that cause and how to prevent skin breakdown.  Problem: Safety: Goal: Ability to remain free from injury will improve Outcome: Progressing Patient on bedrest this shift.  Patient had left hip surgery in the past and has not recovered strength.  He currently receives [facility] care from [facility].  Patient is very weak and unable to stand or ambulate.  Bed in lowest position with wheels locked and bed alarm on throughout shift.  Patient is compliant with pushing call bell for assistance.  Hourly rounding in place to access needs.    Problem: Skin Integrity: Goal: Risk for impaired skin integrity will decrease Outcome: Not Progressing Patient with Stage II pressure ulcers on right and left buttocks and sacrum.  Stage 2 on left ankle and right upper thigh.  Unstageable on right heel and side of left foot.  Patient is incontinent with briefs on.  All pressure ulcers cleaned and dressed with pink foam.  2 hour repositioning this shift and heels floated throughout shift to provide further breakdown.

## 2015-07-28 ENCOUNTER — Inpatient Hospital Stay: Payer: Medicare (Managed Care)

## 2015-07-28 DIAGNOSIS — L8990 Pressure ulcer of unspecified site, unspecified stage: Secondary | ICD-10-CM | POA: Insufficient documentation

## 2015-07-28 LAB — BASIC METABOLIC PANEL
ANION GAP: 8 (ref 5–15)
BUN: 12 mg/dL (ref 6–20)
CHLORIDE: 113 mmol/L — AB (ref 101–111)
CO2: 23 mmol/L (ref 22–32)
Calcium: 8.6 mg/dL — ABNORMAL LOW (ref 8.9–10.3)
Creatinine, Ser: 0.96 mg/dL (ref 0.61–1.24)
GFR calc Af Amer: >60 mL/min (ref >60)
GFR calc non Af Amer: >60 mL/min (ref >60)
Glucose, Bld: 126 mg/dL — ABNORMAL HIGH (ref 65–99)
POTASSIUM: 3.6 mmol/L (ref 3.5–5.1)
SODIUM: 144 mmol/L (ref 135–145)

## 2015-07-28 LAB — URINALYSIS COMPLETE WITH MICROSCOPIC (ARMC ONLY)
BACTERIA UA: NONE SEEN
BILIRUBIN URINE: NEGATIVE
GLUCOSE, UA: 50 mg/dL — AB
Ketones, ur: NEGATIVE mg/dL
NITRITE: NEGATIVE
Protein, ur: NEGATIVE mg/dL
SPECIFIC GRAVITY, URINE: 1.003 — AB (ref 1.005–1.030)
pH: 7 (ref 5.0–8.0)

## 2015-07-28 LAB — CBC
HCT: 27.8 % — ABNORMAL LOW (ref 40.0–52.0)
HEMOGLOBIN: 8.8 g/dL — AB (ref 13.0–18.0)
MCH: 32.6 pg (ref 26.0–34.0)
MCHC: 31.7 g/dL — ABNORMAL LOW (ref 32.0–36.0)
MCV: 102.7 fL — AB (ref 80.0–100.0)
Platelets: 235 10*3/uL (ref 150–440)
RBC: 2.71 MIL/uL — AB (ref 4.40–5.90)
RDW: 17.4 % — ABNORMAL HIGH (ref 11.5–14.5)
WBC: 8.1 10*3/uL (ref 3.8–10.6)

## 2015-07-28 LAB — GLUCOSE, CAPILLARY
GLUCOSE-CAPILLARY: 115 mg/dL — AB (ref 65–99)
GLUCOSE-CAPILLARY: 159 mg/dL — AB (ref 65–99)
GLUCOSE-CAPILLARY: 165 mg/dL — AB (ref 65–99)
Glucose-Capillary: 185 mg/dL — ABNORMAL HIGH (ref 65–99)

## 2015-07-28 LAB — URINE CULTURE: Culture: >=100000

## 2015-07-28 MED ORDER — SODIUM CHLORIDE 0.9 % IV SOLN
250.0000 mg | Freq: Four times a day (QID) | INTRAVENOUS | Status: DC
  Filled 2015-07-28 (×4): qty 250

## 2015-07-28 MED ORDER — SODIUM CHLORIDE 0.9 % IV SOLN
1.0000 g | INTRAVENOUS | Status: DC
  Administered 2015-07-28 – 2015-07-31 (×4): 1 g via INTRAVENOUS
  Filled 2015-07-28 (×6): qty 1

## 2015-07-28 MED ORDER — SODIUM CHLORIDE 0.9 % IV SOLN: 500.0000 mg | Freq: Three times a day (TID) | INTRAVENOUS | Status: DC

## 2015-07-28 MED ORDER — ENOXAPARIN SODIUM 40 MG/0.4ML ~~LOC~~ SOLN
40.0000 mg | SUBCUTANEOUS | Status: DC
  Administered 2015-07-28 – 2015-07-30 (×3): 40 mg via SUBCUTANEOUS
  Filled 2015-07-28 (×3): qty 0.4

## 2015-07-28 NOTE — NC FL2 (Signed)
[place] LEVEL OF CARE SCREENING TOOL     IDENTIFICATION  Patient Name: Marcus [name] Birthdate: [birth year] Sex: male Admission Date (Current Location): 07/26/2015  [facility] and [place] Number: Engineering geologist and Address:  [facility], [address], [place], [place] [address]      Provider Number: [id number]  Attending Physician Name and Address:  Epifanio Lesches, MD  Relative Name and Phone Number:       Current Level of Care: Hospital Recommended Level of Care: [place] Prior Approval Number:    Date Approved/Denied:   PASRR Number:    Discharge Plan: SNF    Current Diagnoses: Patient Active Problem List   Diagnosis Date Noted  . Pressure ulcer 07/28/2015  . Sepsis ([place]) 07/26/2015  . Infection of urinary tract 06/09/2015  . Fever presenting with conditions classified elsewhere 06/09/2015  . Anemia 06/09/2015  . Hypernatremia 06/09/2015  . Lung cancer ([place]) 06/09/2015  . Encephalopathy, metabolic 66/29/4765  . Closed left hip fracture ([place]) 06/06/2015  . Malignant neoplasm of upper lobe of left lung ([place])   . History of chest tube placement   . Pneumothorax after biopsy 04/20/2015    Orientation ACTIVITIES/SOCIAL BLADDER RESPIRATION    Self, Time, Situation, Place  Passive Incontinent Normal  BEHAVIORAL SYMPTOMS/MOOD NEUROLOGICAL BOWEL NUTRITION STATUS      Incontinent Diet (Heart Health, Thin Liquids)  PHYSICIAN VISITS COMMUNICATION OF NEEDS Height & Weight Skin  30 days Verbally   125 lbs. PU Stage and Appropriate Care          AMBULATORY STATUS RESPIRATION    Assist extensive Normal      Personal Care Assistance Level of Assistance  Bathing, Dressing, Feeding Bathing Assistance: Limited assistance Feeding assistance: Independent Dressing Assistance: Limited assistance      Functional Limitations Info  Hearing, Speech   Hearing Info: Impaired Speech Info: Impaired        SPECIAL CARE FACTORS FREQUENCY  PT (By licensed PT)                   Additional Factors Info  Code Status Code Status Info: DNR             Current Medications (07/28/2015): Current Facility-Administered Medications  Medication Dose Route Frequency Provider Last Rate Last Dose  . acetaminophen (TYLENOL) tablet 650 mg  650 mg Oral Q6H PRN Henreitta Leber, MD       Or  . acetaminophen (TYLENOL) suppository 650 mg  650 mg Rectal Q6H PRN Henreitta Leber, MD      . acetaminophen (TYLENOL) tablet 650 mg  650 mg Oral Q4H PRN Henreitta Leber, MD   650 mg at 07/26/15 2159  . albuterol (PROVENTIL) (2.5 MG/3ML) 0.083% nebulizer solution 2.5 mg  2.5 mg Nebulization Q4H Henreitta Leber, MD   2.5 mg at 07/28/15 1513  . alum & mag hydroxide-simeth (MAALOX/MYLANTA) 200-200-20 MG/5ML suspension 30 mL  30 mL Oral Q4H PRN Henreitta Leber, MD      . aspirin EC tablet 81 mg  81 mg Oral Daily Henreitta Leber, MD   81 mg at 07/28/15 0813  . atorvastatin (LIPITOR) tablet 40 mg  40 mg Oral QHS Henreitta Leber, MD   40 mg at 07/27/15 2028  . bisacodyl (DULCOLAX) EC tablet 5 mg  5 mg Oral Daily PRN Henreitta Leber, MD      . divalproex (DEPAKOTE ER) 24 hr tablet 1,000 mg  1,000 mg Oral QHS Henreitta Leber, MD   1,000 mg at 07/27/15 2029  . divalproex (DEPAKOTE ER) 24 hr tablet 250 mg  250 mg Oral QHS Henreitta Leber, MD   250 mg at 07/27/15 2028  . enoxaparin (LOVENOX) injection 40 mg  40 mg Subcutaneous Q24H Epifanio Lesches, MD      . ertapenem [facility]) 1 g in sodium chloride 0.9 % 50 mL IVPB  1 g Intravenous Q24H Vena Rua, RPH   1 g at 07/28/15 1509  . feeding supplement (ENSURE ENLIVE) (ENSURE ENLIVE) liquid 237 mL  237 mL Oral BID BM Henreitta Leber, MD   237 mL at 07/28/15 1300  . ferrous sulfate tablet 325 mg  325 mg Oral Q breakfast Henreitta Leber, MD   325 mg at 07/28/15 0813  . gabapentin (NEURONTIN) tablet 600 mg  600 mg Oral QHS Henreitta Leber, MD   600 mg at 07/27/15 2028   . insulin aspart (novoLOG) injection 0-5 Units  0-5 Units Subcutaneous QHS Henreitta Leber, MD   0 Units at 07/27/15 2311  . insulin aspart (novoLOG) injection 0-9 Units  0-9 Units Subcutaneous TID WC Henreitta Leber, MD   2 Units at 07/28/15 1155  . levothyroxine (SYNTHROID, LEVOTHROID) tablet 75 mcg  75 mcg Oral QAC breakfast Henreitta Leber, MD   75 mcg at 07/28/15 0813  . loperamide (IMODIUM) capsule 2 mg  2 mg Oral QID PRN Henreitta Leber, MD      . magnesium hydroxide (MILK OF MAGNESIA) suspension 30 mL  30 mL Oral Daily PRN Henreitta Leber, MD      . methocarbamol (ROBAXIN) tablet 500 mg  500 mg Oral Q6H PRN Henreitta Leber, MD      . metoprolol succinate (TOPROL-XL) 24 hr tablet 50 mg  50 mg Oral Daily Henreitta Leber, MD   50 mg at 07/28/15 0813  . OLANZapine (ZYPREXA) tablet 20 mg  20 mg Oral QHS Henreitta Leber, MD   20 mg at 07/27/15 2027  . ondansetron (ZOFRAN) tablet 4 mg  4 mg Oral Q6H PRN Henreitta Leber, MD       Or  . ondansetron (ZOFRAN) injection 4 mg  4 mg Intravenous Q6H PRN Henreitta Leber, MD      . psyllium (HYDROCIL/METAMUCIL) packet 1 packet  1 packet Oral Daily Henreitta Leber, MD   1 packet at 07/28/15 [phone]  . senna (SENOKOT) tablet 8.6 mg  1 tablet Oral BID Henreitta Leber, MD   8.6 mg at 07/28/15 0813  . sodium chloride 0.9 % injection 3 mL  3 mL Intravenous Q12H Henreitta Leber, MD   3 mL at 07/27/15 2031  . vitamin A & D ointment 1 application  1 application Topical BID PRN Henreitta Leber, MD   1 application at 55/73/22 [phone]  . Vitamin D (Ergocalciferol) (DRISDOL) capsule 50,000 Units  50,000 Units Oral Q30 days Henreitta Leber, MD   50,000 Units at 07/26/15 2202   Do not use this list as official medication orders. Please verify with discharge summary.  Discharge Medications:   Medication List    ASK your doctor about these medications        acetaminophen 325 MG tablet  Commonly known as:  TYLENOL  Take 650 mg by mouth every 4 (four) hours as needed  for mild pain, fever or headache.     albuterol (2.5 MG/3ML) 0.083% nebulizer  solution  Commonly known as:  PROVENTIL  Take 3 mLs (2.5 mg total) by nebulization every 4 (four) hours.     alum & mag hydroxide-simeth 200-200-20 MG/5ML suspension  Commonly known as:  MAALOX/MYLANTA  Take 30 mLs by mouth every 4 (four) hours as needed for indigestion.     aspirin EC 81 MG tablet  Take 81 mg by mouth daily.     atorvastatin 40 MG tablet  Commonly known as:  LIPITOR  Take 40 mg by mouth at bedtime.     bisacodyl 5 MG EC tablet  Commonly known as:  DULCOLAX  Take 1 tablet (5 mg total) by mouth daily as needed for moderate constipation.     cadexomer iodine 0.9 % gel  Commonly known as:  IODOSORB  Apply 1 application topically daily as needed for wound care (use with dressing changes every other day and as needed).     dextromethorphan 30 MG/5ML liquid  Commonly known as:  DELSYM  Take 5 mLs (30 mg total) by mouth 2 (two) times daily as needed for cough.     divalproex 250 MG 24 hr tablet  Commonly known as:  DEPAKOTE ER  Take 250 mg by mouth at bedtime. Pt takes with two '500mg'$  tablets.     divalproex 500 MG 24 hr tablet  Commonly known as:  DEPAKOTE ER  Take 1,000 mg by mouth at bedtime.     doxycycline 100 MG tablet  Commonly known as:  VIBRA-TABS  Take 100 mg by mouth 2 (two) times daily.     feeding supplement (ENSURE ENLIVE) Liqd  Take 237 mLs by mouth 2 (two) times daily between meals.     ferrous sulfate 325 (65 FE) MG tablet  Take 1 tablet (325 mg total) by mouth daily with breakfast.     gabapentin 600 MG tablet  Commonly known as:  NEURONTIN  Take 600 mg by mouth at bedtime.     levofloxacin 500 MG tablet  Commonly known as:  LEVAQUIN  Take 500 mg by mouth daily.     levothyroxine 75 MCG tablet  Commonly known as:  SYNTHROID, LEVOTHROID  Take 75 mcg by mouth daily before breakfast.     loperamide 2 MG tablet  Commonly known as:  IMODIUM A-D  Take 2 mg by  mouth 4 (four) times daily as needed for diarrhea or loose stools.     magnesium hydroxide 400 MG/5ML suspension  Commonly known as:  MILK OF MAGNESIA  Take 30 mLs by mouth daily as needed for mild constipation.     methocarbamol 500 MG tablet  Commonly known as:  ROBAXIN  Take 1 tablet (500 mg total) by mouth every 6 (six) hours as needed for muscle spasms.     metoprolol succinate 50 MG 24 hr tablet  Commonly known as:  TOPROL-XL  Take 50 mg by mouth daily.     OLANZapine 20 MG tablet  Commonly known as:  ZYPREXA  Take 20 mg by mouth at bedtime.     psyllium 0.52 G capsule  Commonly known as:  REGULOID  Take 0.52 g by mouth daily.     senna 8.6 MG Tabs tablet  Commonly known as:  SENOKOT  Take 1 tablet (8.6 mg total) by mouth 2 (two) times daily.     vitamin A & D ointment  Apply 1 application topically 2 (two) times daily as needed (barrier).     Vitamin D (Ergocalciferol) 50000 UNITS Caps capsule  Commonly  known as:  DRISDOL  Take 50,000 Units by mouth every 30 (thirty) days.        Relevant Imaging Results:  Relevant Lab Results:  Recent Labs    Additional Information No known allergies  Darden Dates, LCSW

## 2015-07-28 NOTE — Progress Notes (Signed)
Initial Nutrition Assessment   INTERVENTION:   Meals and Snacks: Cater to patient preferences Medical Food Supplement Therapy: Agree with Ensure Enlive po BID, each supplement provides 350 kcal and 20 grams of protein. Will also send Magic cup on dinner trays   NUTRITION DIAGNOSIS:   Increased nutrient needs related to wound healing as evidenced by estimated needs.  GOAL:   Patient will meet greater than or equal to 90% of their needs  MONITOR:    (Energy Intake, skin, Electrolyte and renal Profile, Digestive system, Anthropometrics)  REASON FOR ASSESSMENT:   Malnutrition Screening Tool    ASSESSMENT:   Pt admitted with sepsis secondary to UTI.  Past Medical History  Diagnosis Date  . Diabetes ([place])   . Vascular dementia   . Bipolar 1 disorder ([place])   . Stroke ([place]) 1998  . Heart disease   . CAD (coronary artery disease)   . Hypertension   . Shortness of breath dyspnea   . Hypothyroidism   . Tuberculosis 2015  . Cancer  [facility])    Diet Order:  Diet Heart Room service appropriate?: Yes; Fluid consistency:: Thin    Current Nutrition: Pt ate 30-40% of breakfast this am including eggs, toast, and coffee and ate 100% of banana. Recorded 60% of meals yesterday and 100% of lunch recorded thus today.  Food/Nutrition-Related History: Pt reports good appetite PTA eating 3 meals per day and likes any flavor of Ensure prior to falling back asleep on visit.   Scheduled Medications:  . albuterol  2.5 mg Nebulization Q4H  . aspirin EC  81 mg Oral Daily  . atorvastatin  40 mg Oral QHS  . divalproex  1,000 mg Oral QHS  . divalproex  250 mg Oral QHS  . enoxaparin (LOVENOX) injection  40 mg Subcutaneous Q24H  . ertapenem  1 g Intravenous Q24H  . feeding supplement (ENSURE ENLIVE)  237 mL Oral BID BM  . ferrous sulfate  325 mg Oral Q breakfast  . gabapentin  600 mg Oral QHS  . insulin aspart  0-5 Units Subcutaneous QHS  . insulin aspart  0-9 Units Subcutaneous TID WC  .  levothyroxine  75 mcg Oral QAC breakfast  . metoprolol succinate  50 mg Oral Daily  . OLANZapine  20 mg Oral QHS  . psyllium  1 packet Oral Daily  . senna  1 tablet Oral BID  . sodium chloride  3 mL Intravenous Q12H  . Vitamin D (Ergocalciferol)  50,000 Units Oral Q30 days    Electrolyte/Renal Profile and Glucose Profile:   Recent Labs Lab 07/26/15 1708 07/26/15 2157 07/27/15 0625 07/28/15 0458  NA 139  --  141 144  K 4.4  --  3.9 3.6  CL 107  --  109 113*  CO2 25  --  25 23  BUN 20  --  14 12  CREATININE 1.38* 1.18 1.10 0.96  CALCIUM 8.7*  --  8.5* 8.6*  GLUCOSE 146*  --  121* 126*   Protein Profile:  Recent Labs Lab 07/26/15 1708  ALBUMIN 2.3*    Gastrointestinal Profile: Last BM: unkown   Nutrition-Focused Physical Exam Findings:  Unable to complete Nutrition-Focused physical exam at this time.    Weight Change: Per MST pt weight has been relatively stable in past few months   Skin:   multiple stage II pressure ulcers on buttocks, heel, ankle, foot, thigh and sacrum  Height:   Ht Readings from Last 1 Encounters:  07/26/15 '5\' 11"'$  (1.803 m)  Weight:   Wt Readings from Last 1 Encounters:  07/26/15 125 lb (56.7 kg)    Wt Readings from Last 10 Encounters:  07/26/15 125 lb (56.7 kg)  06/06/15 125 lb (56.7 kg)  05/03/15 126 lb 5.2 oz (57.3 kg)  04/27/15 127 lb 13.9 oz (58 kg)  04/20/15 137 lb (62.143 kg)  04/13/15 128 lb 6.7 oz (58.25 kg)     Ideal Body Weight:   78kg  BMI:  Body mass index is 17.44 kg/(m^2).  Estimated Nutritional Needs:   Kcal:  BEE: 1567kcals, TEE: (IF 1.1-1.3)(AF 1.2) 3539-1225YTMMI  Protein:  94-117g protein (1.2-1.5g/kg)  Fluid:  1950-2369m of fluid (25-338mkg)   HIGH Care Level  AlDwyane LuoRD, LDN Pager [phone]

## 2015-07-28 NOTE — Plan of Care (Signed)
Problem: Safety: Goal: Ability to remain free from injury will improve Outcome: Progressing Patient on bedrest this shift. Patient is very weak and unable to stand or ambulate. Bed in lowest position with wheels locked and bed alarm on throughout shift. Patient is compliant with pushing call bell for assistance. Hourly rounding in place to access needs.   Problem: Skin Integrity: Goal: Risk for impaired skin integrity will decrease Outcome: Not Progressing Patient with Stage II pressure ulcers on right and left buttocks and sacrum. Stage 2 on left ankle and right upper thigh. Unstageable on right heel and side of left foot. Patient is incontinent with briefs on. All pressure ulcers cleaned and dressed with pink foam. Wound care nurse assessed and dressed wounds on day shift.  Dressings remain clean, dry and intact.  2 hour repositioning this shift and heels floated throughout shift to provide further breakdown.

## 2015-07-28 NOTE — Clinical Social Work Note (Signed)
Pt is a [facility] pt, CSW spoke with facility regarding bed availability. Per [facility], a bed is available for pt and is ready to admit pt tomorrow. However, CSW and RNCM were not aware of dischage. CSW completed FL2 and sent to [facility], full assessment to follow.

## 2015-07-28 NOTE — Consult Note (Signed)
ANTIBIOTIC CONSULT NOTE - INITIAL   Pharmacy Consult for imipenem/cilastatin Indication: ECOLI ESBL UTI  No Known Allergies  Patient Measurements: Height: '5\' 11"'$  (180.3 cm) Weight: 125 lb (56.7 kg) IBW/kg (Calculated) : 75.3 Adjusted Body Weight:   Vital Signs: Temp: 98.5 F (36.9 C) (11/11 0810) Temp Source: Oral (11/11 0810) BP: 156/111 mmHg (11/11 0810) Pulse Rate: 107 (11/11 0810) Intake/Output from previous day: 11/10 0701 - 11/11 0700 In: 1168.8 [P.O.:360; I.V.:808.8] Out: 300 [Urine:300] Intake/Output from this shift:    Labs:  Recent Labs  07/26/15 2157 07/27/15 0625 07/28/15 0458  WBC 6.7 7.0 8.1  HGB 9.8* 10.1* 8.8*  PLT 269 221 235  CREATININE 1.18 1.10 0.96   Estimated Creatinine Clearance: 59.1 mL/min (by C-G formula based on Cr of 0.96). No results for input(s): VANCOTROUGH, VANCOPEAK, VANCORANDOM, GENTTROUGH, GENTPEAK, GENTRANDOM, TOBRATROUGH, TOBRAPEAK, TOBRARND, AMIKACINPEAK, AMIKACINTROU, AMIKACIN in the last 72 hours.   Microbiology: Recent Results (from the past 720 hour(s))  Culture, blood (routine x 2)     Status: None (Preliminary result)   Collection Time: 07/26/15  5:01 PM  Result Value Ref Range Status   Specimen Description BLOOD RIGHT ARM  Final   Special Requests BOTTLES DRAWN AEROBIC AND ANAEROBIC 5CC  Final   Culture NO GROWTH 2 DAYS  Final   Report Status PENDING  Incomplete  Culture, blood (routine x 2)     Status: None (Preliminary result)   Collection Time: 07/26/15  5:01 PM  Result Value Ref Range Status   Specimen Description BLOOD RIGHT ASSIST CONTROL  Final   Special Requests BOTTLES DRAWN AEROBIC AND ANAEROBIC 5CC  Final   Culture NO GROWTH 2 DAYS  Final   Report Status PENDING  Incomplete  Urine culture     Status: None (Preliminary result)   Collection Time: 07/26/15  5:01 PM  Result Value Ref Range Status   Specimen Description URINE, CLEAN CATCH  Final   Special Requests NONE  Final   Culture   Final   >=100,000 COLONIES/mL ESCHERICHIA COLI ESBL-EXTENDED SPECTRUM BETA LACTAMASE-THE ORGANISM IS RESISTANT TO PENICILLINS, CEPHALOSPORINS AND AZTREONAM ACCORDING TO CLSI M100-S15 VOL.[name]. CRITICAL RESULT CALLED TO, READ BACK BY AND VERIFIED WITH: MEGAN OAKLEY,RN 07/28/2015 0801 BY JRS.    Report Status PENDING  Incomplete   Organism ID, Bacteria ESCHERICHIA COLI  Final      Susceptibility   Escherichia coli - MIC*    AMPICILLIN >=32 RESISTANT Resistant     CEFAZOLIN >=64 RESISTANT Resistant     CEFTRIAXONE >=64 RESISTANT Resistant     CIPROFLOXACIN >=4 RESISTANT Resistant     GENTAMICIN <=1 SENSITIVE Sensitive     IMIPENEM <=0.25 SENSITIVE Sensitive     NITROFURANTOIN <=16 SENSITIVE Sensitive     TRIMETH/SULFA >=320 RESISTANT Resistant     Extended ESBL POSITIVE Resistant     PIP/TAZO Value in next row Sensitive      SENSITIVE<=4    LEVOFLOXACIN Value in next row Resistant      RESISTANT>=8    * >=100,000 COLONIES/mL ESCHERICHIA COLI  MRSA PCR Screening     Status: None   Collection Time: 07/27/15  6:01 AM  Result Value Ref Range Status   MRSA by PCR NEGATIVE NEGATIVE Final    Comment:        The GeneXpert MRSA Assay (FDA approved for NASAL specimens only), is one component of a comprehensive MRSA colonization surveillance program. It is not intended to diagnose MRSA infection nor to guide or monitor  treatment for MRSA infections.     Medical History: Past Medical History  Diagnosis Date  . Diabetes ([place])   . Vascular dementia   . Bipolar 1 disorder ([place])   . Stroke ([place]) 1998  . Heart disease   . CAD (coronary artery disease)   . Hypertension   . Shortness of breath dyspnea   . Hypothyroidism   . Tuberculosis 2015  . Cancer ([facility])     Medications:  Scheduled:  . albuterol  2.5 mg Nebulization Q4H  . aspirin EC  81 mg Oral Daily  . atorvastatin  40 mg Oral QHS  . divalproex  1,000 mg Oral QHS  . divalproex  250 mg Oral QHS  . feeding supplement  (ENSURE ENLIVE)  237 mL Oral BID BM  . ferrous sulfate  325 mg Oral Q breakfast  . gabapentin  600 mg Oral QHS  . heparin  5,000 Units Subcutaneous 3 times per day  . imipenem-cilastatin  250 mg Intravenous 4 times per day  . insulin aspart  0-5 Units Subcutaneous QHS  . insulin aspart  0-9 Units Subcutaneous TID WC  . levothyroxine  75 mcg Oral QAC breakfast  . metoprolol succinate  50 mg Oral Daily  . OLANZapine  20 mg Oral QHS  . psyllium  1 packet Oral Daily  . senna  1 tablet Oral BID  . sodium chloride  3 mL Intravenous Q12H  . Vitamin D (Ergocalciferol)  50,000 Units Oral Q30 days   Assessment: Pt is a 69 year old male with an Ecoli ESBL UTI.   Goal of Therapy:  resolution of infection  Plan:  due to pt weight at renal function, will dose at '250mg'$  q 6 hours. Pharmacy to continue to monitor.  Marcus [name] 07/28/2015,8:42 AM

## 2015-07-28 NOTE — Progress Notes (Signed)
[name] NAME: Marcus [name]    MR#:  [id number]  DATE OF BIRTH:  [birth year]  SUBJECTIVE: Patient denies any complaints. Afebrile. Urine culture showing ESBL.   CHIEF COMPLAINT:   Chief Complaint  Patient presents with  . Wound Infection   Feeling well. Eating breakfast. Denies pain  REVIEW OF SYSTEMS:   Review of Systems  Constitutional: Negative for fever.  Respiratory: Negative for shortness of breath.   Cardiovascular: Negative for chest pain and palpitations.  Gastrointestinal: Negative for nausea, vomiting and abdominal pain.  Genitourinary: Negative for dysuria.   DRUG ALLERGIES:  No Known Allergies  VITALS:  Blood pressure 156/111, pulse 107, temperature 98.5 F (36.9 C), temperature source Oral, resp. rate 18, height '5\' 11"'$  (1.803 m), weight 56.7 kg (125 lb), SpO2 95 %.  PHYSICAL EXAMINATION:  GENERAL:  69 y.o.-year-old patient lying in the bed with no acute distress. Thin  LUNGS: Normal breath sounds bilaterally, no wheezing, rales, rhonchi or crepitation. No use of accessory muscles of respiration.  CARDIOVASCULAR: S1, S2 normal. No murmurs, rubs, or gallops.  ABDOMEN: Soft, nontender, nondistended. Bowel sounds present. No organomegaly or mass. No guarding no rebound EXTREMITIES: No pedal edema, cyanosis, or clubbing.  NEUROLOGIC: Cranial nerves II through XII are intact. Muscle strength 4+ /5 in all extremities. Sensation intact. Gait not checked.  PSYCHIATRIC: The patient is alert and oriented x 3.  SKIN: Multiple ulcers over the lower legs and sacrum  LABORATORY PANEL:   CBC  Recent Labs Lab 07/28/15 0458  WBC 8.1  HGB 8.8*  HCT 27.8*  PLT 235   ------------------------------------------------------------------------------------------------------------------  Chemistries   Recent Labs Lab 07/26/15 1708  07/28/15 0458  NA 139  < > 144  K 4.4  < > 3.6  CL 107  < > 113*  CO2 25   < > 23  GLUCOSE 146*  < > 126*  BUN 20  < > 12  CREATININE 1.38*  < > 0.96  CALCIUM 8.7*  < > 8.6*  AST 26  --   --   ALT 15*  --   --   ALKPHOS 187*  --   --   BILITOT 0.4  --   --   < > = values in this interval not displayed. ------------------------------------------------------------------------------------------------------------------  Cardiac Enzymes No results for input(s): TROPONINI in the last 168 hours. ------------------------------------------------------------------------------------------------------------------  RADIOLOGY:  Dg Chest Port 1 View  07/26/2015  CLINICAL DATA:  Code sepsis, sacral ulcer, personal history of diabetes mellitus, stroke, hypertension, coronary artery disease, dementia, TB, lung cancer EXAM: PORTABLE CHEST 1 VIEW COMPARISON:  Portable exam 1712 hours compared to 06/06/2015 FINDINGS: Normal heart size post CABG. Mediastinal contours and pulmonary vascularity normal. Lungs emphysematous with minimal chronic accentuation of LEFT upper lobe markings stable. No acute infiltrate, pleural effusion or pneumothorax. Bones demineralized. IMPRESSION: COPD changes with minimal atelectasis or scarring LEFT upper lobe. No acute abnormalities. Electronically Signed   By: [name]   On: 07/26/2015 17:27    EKG:   Orders placed or performed during the hospital encounter of 07/26/15  . ED EKG 12-Lead  . ED EKG 12-Lead    ASSESSMENT AND PLAN:   69 year old male with past medical history of diabetes, hypertension, hypothyroidism, history of Crohn's disease status post bypass, previous CVA, bipolar disorder who presented to the hospital due to fever and malaise and suspected to have sepsis.  #1 sepsis - Resolved, no longer tachycardic, febrile  or hypotensive, white count is normal .  discontinue IV fluids. -ESBL UTI. Blood cultures are negative for 2 days.  #2 UTI -Due to have ESBL UTI multidrug resistant. Continue Primaxin. Obtain PICC line consult.-  S discontinue Rocephin.  #3  diabetes type 2 diabetic neuropathy - continue SSI -continue Neurontin.  #4 bipolar disorder: stable -continue Depakote.  #5 hypothyroidism-continue Synthroid.  #6 hypertension-continue metoprolol.  #7 multiple decubitus ulcers over left and right feet left thigh and sacrum; present  on admission. - Care per wound care team,  8.deconditioning.PT CONSULT. All the records are reviewed and case discussed with Care Management/Social Workerr. Management plans discussed with the patient, family and they are in agreement.  CODE STATUS: DNR  TOTAL TIME TAKING CARE OF THIS PATIENT: 35 minutes.  Greater than 50% of time spent in care coordination and counseling. POSSIBLE D/C IN 1 DAYS, DEPENDING ON CLINICAL CONDITION.   Epifanio Lesches M.D on 07/28/2015 at 9:18 AM  Between 7am to 6pm - Pager - [phone]  After 6pm go to [email or web address] - password [email or web address] [facility]  [facility]  Office  [phone]  CC: Primary care physician; [name], MD

## 2015-07-28 NOTE — Progress Notes (Signed)
ANTIBIOTIC CONSULT NOTE - INITIAL  Pharmacy Consult for Ertapenem Indication: ESBL UTI  No Known Allergies  Patient Measurements: Height: '5\' 11"'$  (180.3 cm) Weight: 125 lb (56.7 kg) IBW/kg (Calculated) : 75.3  Vital Signs: Temp: 98.5 F (36.9 C) (11/11 0810) Temp Source: Oral (11/11 0810) BP: 156/111 mmHg (11/11 0810) Pulse Rate: 107 (11/11 0810) Intake/Output from previous day: 11/10 0701 - 11/11 0700 In: 1168.8 [P.O.:360; I.V.:808.8] Out: 300 [Urine:300] Intake/Output from this shift:    Labs:  Recent Labs  07/26/15 2157 07/27/15 0625 07/28/15 0458  WBC 6.7 7.0 8.1  HGB 9.8* 10.1* 8.8*  PLT 269 221 235  CREATININE 1.18 1.10 0.96   Estimated Creatinine Clearance: 59.1 mL/min (by C-G formula based on Cr of 0.96).   Microbiology: Recent Results (from the past 720 hour(s))  Culture, blood (routine x 2)     Status: None (Preliminary result)   Collection Time: 07/26/15  5:01 PM  Result Value Ref Range Status   Specimen Description BLOOD RIGHT ARM  Final   Special Requests BOTTLES DRAWN AEROBIC AND ANAEROBIC 5CC  Final   Culture NO GROWTH 2 DAYS  Final   Report Status PENDING  Incomplete  Culture, blood (routine x 2)     Status: None (Preliminary result)   Collection Time: 07/26/15  5:01 PM  Result Value Ref Range Status   Specimen Description BLOOD RIGHT ASSIST CONTROL  Final   Special Requests BOTTLES DRAWN AEROBIC AND ANAEROBIC 5CC  Final   Culture NO GROWTH 2 DAYS  Final   Report Status PENDING  Incomplete  Urine culture     Status: None (Preliminary result)   Collection Time: 07/26/15  5:01 PM  Result Value Ref Range Status   Specimen Description URINE, CLEAN CATCH  Final   Special Requests NONE  Final   Culture   Final    >=100,000 COLONIES/mL ESCHERICHIA COLI ESBL-EXTENDED SPECTRUM BETA LACTAMASE-THE ORGANISM IS RESISTANT TO PENICILLINS, CEPHALOSPORINS AND AZTREONAM ACCORDING TO CLSI M100-S15 VOL.[place]. CRITICAL RESULT CALLED TO, READ BACK BY  AND VERIFIED WITH: MEGAN OAKLEY,RN 07/28/2015 0801 BY JRS.    Report Status PENDING  Incomplete   Organism ID, Bacteria ESCHERICHIA COLI  Final      Susceptibility   Escherichia coli - MIC*    AMPICILLIN >=32 RESISTANT Resistant     CEFAZOLIN >=64 RESISTANT Resistant     CEFTRIAXONE >=64 RESISTANT Resistant     CIPROFLOXACIN >=4 RESISTANT Resistant     GENTAMICIN <=1 SENSITIVE Sensitive     IMIPENEM <=0.25 SENSITIVE Sensitive     NITROFURANTOIN <=16 SENSITIVE Sensitive     TRIMETH/SULFA >=320 RESISTANT Resistant     Extended ESBL POSITIVE Resistant     PIP/TAZO Value in next row Sensitive      SENSITIVE<=4    LEVOFLOXACIN Value in next row Resistant      RESISTANT>=8    * >=100,000 COLONIES/mL ESCHERICHIA COLI  MRSA PCR Screening     Status: None   Collection Time: 07/27/15  6:01 AM  Result Value Ref Range Status   MRSA by PCR NEGATIVE NEGATIVE Final    Comment:        The GeneXpert MRSA Assay (FDA approved for NASAL specimens only), is one component of a comprehensive MRSA colonization surveillance program. It is not intended to diagnose MRSA infection nor to guide or monitor treatment for MRSA infections.     Medical History: Past Medical History  Diagnosis Date  . Diabetes ([place])   . Vascular dementia   .  Bipolar 1 disorder ([place])   . Stroke ([place]) 1998  . Heart disease   . CAD (coronary artery disease)   . Hypertension   . Shortness of breath dyspnea   . Hypothyroidism   . Tuberculosis 2015  . Cancer (HCC)     Medications:  Scheduled:  . albuterol  2.5 mg Nebulization Q4H  . aspirin EC  81 mg Oral Daily  . atorvastatin  40 mg Oral QHS  . divalproex  1,000 mg Oral QHS  . divalproex  250 mg Oral QHS  . ertapenem  1 g Intravenous Q24H  . feeding supplement (ENSURE ENLIVE)  237 mL Oral BID BM  . ferrous sulfate  325 mg Oral Q breakfast  . gabapentin  600 mg Oral QHS  . heparin  5,000 Units Subcutaneous 3 times per day  . insulin aspart  0-5 Units  Subcutaneous QHS  . insulin aspart  0-9 Units Subcutaneous TID WC  . levothyroxine  75 mcg Oral QAC breakfast  . metoprolol succinate  50 mg Oral Daily  . OLANZapine  20 mg Oral QHS  . psyllium  1 packet Oral Daily  . senna  1 tablet Oral BID  . sodium chloride  3 mL Intravenous Q12H  . Vitamin D (Ergocalciferol)  50,000 Units Oral Q30 days   Assessment: Marcus [name] is a 69 yo male admitted for lower extremity wounds found to have an ESBL UTI. Pharmacy consulted to dose ertapenem.  Plan:  Initiate Ertapenem 1g q24hrs with likely treatment duration 10-14 days.  Pharmacy will continue to monitor for signs and symptoms of infection resolution.   [place] 07/28/2015,9:40 AM

## 2015-07-29 LAB — GLUCOSE, CAPILLARY
GLUCOSE-CAPILLARY: 241 mg/dL — AB (ref 65–99)
GLUCOSE-CAPILLARY: 161 mg/dL — AB (ref 65–99)
Glucose-Capillary: 128 mg/dL — ABNORMAL HIGH (ref 65–99)
Glucose-Capillary: 180 mg/dL — ABNORMAL HIGH (ref 65–99)

## 2015-07-29 LAB — BASIC METABOLIC PANEL
ANION GAP: 3 — AB (ref 5–15)
BUN: 11 mg/dL (ref 6–20)
CHLORIDE: 116 mmol/L — AB (ref 101–111)
CO2: 26 mmol/L (ref 22–32)
CREATININE: 0.83 mg/dL (ref 0.61–1.24)
Calcium: 8.5 mg/dL — ABNORMAL LOW (ref 8.9–10.3)
GLUCOSE: 148 mg/dL — AB (ref 65–99)
POTASSIUM: 3.7 mmol/L (ref 3.5–5.1)
SODIUM: 145 mmol/L (ref 135–145)

## 2015-07-29 MED ORDER — SODIUM CHLORIDE 0.9 % IV SOLN: 1.0000 g | INTRAVENOUS | Status: AC

## 2015-07-29 MED ORDER — INSULIN ASPART 100 UNIT/ML ~~LOC~~ SOLN: 0.0000 [IU] | Freq: Three times a day (TID) | SUBCUTANEOUS | Status: AC

## 2015-07-29 MED ORDER — METOPROLOL SUCCINATE ER 25 MG PO TB24: 25.0000 mg | ORAL_TABLET | Freq: Every day | ORAL | Status: DC

## 2015-07-29 MED ORDER — METOPROLOL SUCCINATE ER 25 MG PO TB24
25.0000 mg | ORAL_TABLET | Freq: Every day | ORAL | Status: DC
  Administered 2015-07-30: 08:00:00 25 mg via ORAL
  Filled 2015-07-29: qty 1

## 2015-07-29 NOTE — Evaluation (Signed)
Physical Therapy Evaluation Patient Details Name: Marcus [name] MRN: [id number] DOB: 12/31/1945 Today's Date: 07/29/2015   History of Present Illness  Pt with L hip fx in September, was at rehab now here with UTI/sepsis  Clinical Impression  Pt is very weak/deconditioned, but shows good effort with exercises, mobility and ambulation.  He has unsteady, shuffling steps and is unable to achieve TKE b/l, but is able to get bed to recliner with mod assist.  Pt was in rehab post L hip sx, will need continued rehab on d/c from [facility].    Follow Up Recommendations SNF    Equipment Recommendations       Recommendations for Other Services       Precautions / Restrictions Precautions Precautions: Posterior Hip Restrictions Weight Bearing Restrictions: No      Mobility  Bed Mobility Overal bed mobility: Modified Independent             General bed mobility comments: PT with heavy use of bed rails, but was actually able to get up to EOB w/o direct assist.    Transfers Overall transfer level: Needs assistance Equipment used: Rolling walker (2 wheeled) Transfers: Sit to/from Stand Sit to Stand: Min assist         General transfer comment: Pt needs momentum to get up with min assist, unable to get knees fully extended but maintains standing balance   Ambulation/Gait Ambulation/Gait assistance: Mod assist;Max assist Ambulation Distance (Feet): 4 Feet Assistive device: Rolling walker (2 wheeled)       General Gait Details: Pt is able to take very unsteady, shuffling steps and needs assist to remain upright.  He shows great effort, but is able unsafe and unsteady with ambulation.  Stairs            Wheelchair Mobility    Modified Rankin (Stroke Patients Only)       Balance                                             Pertinent Vitals/Pain Pain Assessment: 0-10 Pain Score: 3  Pain Location: L hip    Home Living Family/patient expects  to be discharged to:: Skilled nursing facility                      Prior Function Level of Independence: Needs assistance         Comments: Pt reports that he is normally able to do some ambulation, but has been very limited since hip sx     Hand Dominance        Extremity/Trunk Assessment   Upper Extremity Assessment: Generalized weakness           Lower Extremity Assessment: Generalized weakness (pt with very atrophied LE, L hip more limited 2/2 sx)         Communication   Communication: No difficulties  Cognition Arousal/Alertness: Awake/alert Behavior During Therapy: Anxious Overall Cognitive Status: Within Functional Limits for tasks assessed                      General Comments      Exercises        Assessment/Plan    PT Assessment Patient needs continued PT services  PT Diagnosis Difficulty walking;Generalized weakness   PT Problem List Decreased strength;Decreased range of motion;Decreased activity tolerance;Decreased balance;Decreased mobility;Decreased knowledge of  use of DME;Decreased safety awareness  PT Treatment Interventions Gait training;Functional mobility training;Therapeutic activities;Therapeutic exercise;Balance training;Neuromuscular re-education   PT Goals (Current goals can be found in the Care Plan section) Acute Rehab PT Goals Patient Stated Goal: realizes that he will need further rehab PT Goal Formulation: With patient Time For Goal Achievement: 08/12/15 Potential to Achieve Goals: Fair    Frequency Min 2X/week   Barriers to discharge        Co-evaluation               End of Session                 Time: 9643-8381 PT Time Calculation (min) (ACUTE ONLY): 18 min   Charges:   PT Evaluation $Initial PT Evaluation Tier I: 1 Procedure     PT G Codes:       Marcus [name], PT, DPT [phone]  Marcus [name] 07/29/2015, 6:15 PM

## 2015-07-29 NOTE — Discharge Summary (Addendum)
Marcus [name], is a 69 y.o. male  DOB 1945-12-28  MRN [id number].  Admission date:  07/26/2015  Admitting Physician  Henreitta Leber, MD  Discharge Date:  07/29/2015   Primary MD  [name], MD  Recommendations for primary care physician for things to follow:  Follow up with [facility] pro gramme physician in one week   Admission Diagnosis  SIRS (systemic inflammatory response syndrome) ([place]) [R65.10] Sepsis ([employer]) [A41.9]   Discharge Diagnosis  SIRS (systemic inflammatory response syndrome) ([place]) [R65.10] Sepsis ([place]) [A41.9]    Active Problems:   Sepsis ([place])   Pressure ulcer      Past Medical History  Diagnosis Date  . Diabetes ([place])   . Vascular dementia   . Bipolar 1 disorder ([place])   . Stroke ([place]) 1998  . Heart disease   . CAD (coronary artery disease)   . Hypertension   . Shortness of breath dyspnea   . Hypothyroidism   . Tuberculosis 2015  . Cancer [facility])     Past Surgical History  Procedure Laterality Date  . Cardiac bypass    . Coronary artery bypass graft    . Appendectomy    . Compression hip screw Left 06/07/2015    Procedure: left hip nailing with DHS;  Surgeon: Earnestine Leys, MD;  Location: [facility] ORS;  Service: Orthopedics;  Laterality: Left;       History of present illness and  Hospital Course:     Kindly see H&P for history of present illness and admission details, please review complete Labs, Consult reports and Test reports for all details in brief  HPI  from the history and physical done on the day of admission  69 year old male with diabetes mellitus type 2, dementia, bipolar disorder, history of CVA, hypothyroidism, hypertension came in from [place] [facility] secondary to fever of 101 Fahrenheit and not feeling well. Lactate level 2.9. Admitted for sepsis.  Hospital  Course  #1 sepsis with lactic acidosis: Patient chest x-ray did not show pneumonia. Urine cultures showed ESBL UTI. Initially urine cultures so far are pending so patient received his Rocephin. But antibiotics are changed to Invanz and PICC line is inserted. Plan is to continue IV Invanz for 14 days for ESBL UTI. End Date is November 26. Patient has no fever at this time. disPosition to [facility]. 2 history of bipolar disorder patient takes about 150 mg of Depakote at bedtime. #3. Hypertension; BP low at times so I have decreased her toprol xl to  25 mg daily. #4 history of diabetes and diabetic neuropathy: And only on sliding scale with coverage,  Patient takes Neurontin for neuropathy. 5 pressure ulcers to sacrum, left ischium right heel, right ischium, left thigh. Patient is seen by wound care, use calcium alternate for sacral ulcers. getting silicone border foam dressing, for right heel, left ankle, left  Thigh.. change the dressing every 3 days, patient does  Have PREVALON boots to offload the heel pressure.  Discharge Condition: stable   Follow UP  Follow-up Information    Follow up with [name], MD In 1 week.   Specialty:  Internal Medicine   Contact information:   [facility] [address] [place] [address] [phone]         Discharge Instructions  and  Discharge Medications        Medication List    STOP taking these medications        dextromethorphan 30 MG/5ML liquid  Commonly  known as:  DELSYM     doxycycline 100 MG tablet  Commonly known as:  VIBRA-TABS     levofloxacin 500 MG tablet  Commonly known as:  LEVAQUIN     loperamide 2 MG tablet  Commonly known as:  IMODIUM A-D      TAKE these medications        acetaminophen 325 MG tablet  Commonly known as:  TYLENOL  Take 650 mg by mouth every 4 (four) hours as needed for mild pain, fever or headache.     albuterol (2.5 MG/3ML) 0.083% nebulizer solution   Commonly known as:  PROVENTIL  Take 3 mLs (2.5 mg total) by nebulization every 4 (four) hours.     alum & mag hydroxide-simeth 200-200-20 MG/5ML suspension  Commonly known as:  MAALOX/MYLANTA  Take 30 mLs by mouth every 4 (four) hours as needed for indigestion.     aspirin EC 81 MG tablet  Take 81 mg by mouth daily.     atorvastatin 40 MG tablet  Commonly known as:  LIPITOR  Take 40 mg by mouth at bedtime.     bisacodyl 5 MG EC tablet  Commonly known as:  DULCOLAX  Take 1 tablet (5 mg total) by mouth daily as needed for moderate constipation.     cadexomer iodine 0.9 % gel  Commonly known as:  IODOSORB  Apply 1 application topically daily as needed for wound care (use with dressing changes every other day and as needed).     divalproex 250 MG 24 hr tablet  Commonly known as:  DEPAKOTE ER  Take 250 mg by mouth at bedtime. Pt takes with two '500mg'$  tablets.     divalproex 500 MG 24 hr tablet  Commonly known as:  DEPAKOTE ER  Take 1,000 mg by mouth at bedtime.     ertapenem 1 g in sodium chloride 0.9 % 50 mL  Inject 1 g into the vein daily.     feeding supplement (ENSURE ENLIVE) Liqd  Take 237 mLs by mouth 2 (two) times daily between meals.     ferrous sulfate 325 (65 FE) MG tablet  Take 1 tablet (325 mg total) by mouth daily with breakfast.     gabapentin 600 MG tablet  Commonly known as:  NEURONTIN  Take 600 mg by mouth at bedtime.     levothyroxine 75 MCG tablet  Commonly known as:  SYNTHROID, LEVOTHROID  Take 75 mcg by mouth daily before breakfast.     magnesium hydroxide 400 MG/5ML suspension  Commonly known as:  MILK OF MAGNESIA  Take 30 mLs by mouth daily as needed for mild constipation.     methocarbamol 500 MG tablet  Commonly known as:  ROBAXIN  Take 1 tablet (500 mg total) by mouth every 6 (six) hours as needed for muscle spasms.     metoprolol succinate 50 MG 24 hr tablet  Commonly known as:  TOPROL-XL  Take 50 mg by mouth daily.     OLANZapine 20  MG tablet  Commonly known as:  ZYPREXA  Take 20 mg by mouth at bedtime.     psyllium 0.52 G capsule  Commonly known as:  REGULOID  Take 0.52 g by mouth daily.     senna 8.6 MG Tabs tablet  Commonly known as:  SENOKOT  Take 1 tablet (8.6 mg total) by mouth 2 (two) times daily.     vitamin A & D ointment  Apply 1 application topically 2 (two) times daily as needed (  barrier).     Vitamin D (Ergocalciferol) 50000 UNITS Caps capsule  Commonly known as:  DRISDOL  Take 50,000 Units by mouth every 30 (thirty) days.          Diet and Activity recommendation: See Discharge Instructions above   Consults obtained - wound care    Major procedures and Radiology Reports - PLEASE review detailed and final reports for all details, in brief -     Dg Chest Port 1 View  07/28/2015  CLINICAL DATA:  Status post PICC placement EXAM: PORTABLE CHEST 1 VIEW COMPARISON:  07/26/2015 FINDINGS: Right PICC tip projects in the lower superior vena cava just above the caval atrial junction, well positioned. Lungs are hyperexpanded. No lung consolidation or edema. No pleural effusion or pneumothorax. Mild scarring at the left lung base. Status post CABG surgery, stable. Cardiac silhouette is normal size. No mediastinal or hilar masses or convincing adenopathy. IMPRESSION: 1. Right PICC is well positioned with its tip just above the caval atrial junction. 2. No acute cardiopulmonary disease. Electronically Signed   By: Lajean Manes M.D.   On: 07/28/2015 14:43   Dg Chest Port 1 View  07/26/2015  CLINICAL DATA:  Code sepsis, sacral ulcer, personal history of diabetes mellitus, stroke, hypertension, coronary artery disease, dementia, TB, lung cancer EXAM: PORTABLE CHEST 1 VIEW COMPARISON:  Portable exam 1712 hours compared to 06/06/2015 FINDINGS: Normal heart size post CABG. Mediastinal contours and pulmonary vascularity normal. Lungs emphysematous with minimal chronic accentuation of LEFT upper lobe markings  stable. No acute infiltrate, pleural effusion or pneumothorax. Bones demineralized. IMPRESSION: COPD changes with minimal atelectasis or scarring LEFT upper lobe. No acute abnormalities. Electronically Signed   By: [name]   On: 07/26/2015 17:27    Micro Results    Recent Results (from the past 240 hour(s))  Culture, blood (routine x 2)     Status: None (Preliminary result)   Collection Time: 07/26/15  5:01 PM  Result Value Ref Range Status   Specimen Description BLOOD RIGHT ARM  Final   Special Requests BOTTLES DRAWN AEROBIC AND ANAEROBIC 5CC  Final   Culture NO GROWTH 3 DAYS  Final   Report Status PENDING  Incomplete  Culture, blood (routine x 2)     Status: None (Preliminary result)   Collection Time: 07/26/15  5:01 PM  Result Value Ref Range Status   Specimen Description BLOOD RIGHT ASSIST CONTROL  Final   Special Requests BOTTLES DRAWN AEROBIC AND ANAEROBIC 5CC  Final   Culture NO GROWTH 3 DAYS  Final   Report Status PENDING  Incomplete  Urine culture     Status: None   Collection Time: 07/26/15  5:01 PM  Result Value Ref Range Status   Specimen Description URINE, CLEAN CATCH  Final   Special Requests NONE  Final   Culture   Final    >=100,000 COLONIES/mL ESCHERICHIA COLI ESBL-EXTENDED SPECTRUM BETA LACTAMASE-THE ORGANISM IS RESISTANT TO PENICILLINS, CEPHALOSPORINS AND AZTREONAM ACCORDING TO CLSI M100-S15 VOL.[place]. CRITICAL RESULT CALLED TO, READ BACK BY AND VERIFIED WITH: MEGAN OAKLEY,RN 07/28/2015 0801 BY JRS. >=100,000 COLONIES/mL GROUP B STREP(S.AGALACTIAE)ISOLATED Virtually 100% of S. agalactiae (Group B) strains are susceptible to Penicillin.  For Penicillin-allergic patients, Erythromycin (85-95% sensitive) and Clindamycin (80% sensitive) are drugs of choice. Contact microbiology lab to request sensitivities if  needed within 7 days.    Report Status 07/28/2015 FINAL  Final   Organism ID, Bacteria ESCHERICHIA COLI  Final  Susceptibility    Escherichia coli - MIC*    AMPICILLIN >=32 RESISTANT Resistant     CEFAZOLIN >=64 RESISTANT Resistant     CEFTRIAXONE >=64 RESISTANT Resistant     CIPROFLOXACIN >=4 RESISTANT Resistant     GENTAMICIN <=1 SENSITIVE Sensitive     IMIPENEM <=0.25 SENSITIVE Sensitive     NITROFURANTOIN <=16 SENSITIVE Sensitive     TRIMETH/SULFA >=320 RESISTANT Resistant     Extended ESBL POSITIVE Resistant     PIP/TAZO Value in next row Sensitive      SENSITIVE<=4    LEVOFLOXACIN Value in next row Resistant      RESISTANT>=8    * >=100,000 COLONIES/mL ESCHERICHIA COLI  MRSA PCR Screening     Status: None   Collection Time: 07/27/15  6:01 AM  Result Value Ref Range Status   MRSA by PCR NEGATIVE NEGATIVE Final    Comment:        The GeneXpert MRSA Assay (FDA approved for NASAL specimens only), is one component of a comprehensive MRSA colonization surveillance program. It is not intended to diagnose MRSA infection nor to guide or monitor treatment for MRSA infections.        Today   Subjective:   Devante Capano today has no headache,no chest abdominal pain,no new weakness tingling or numbness, no fever.plan to discharge to [facility] today, Objective:   Blood pressure 98/58, pulse 102, temperature 99.4 F (37.4 C), temperature source Oral, resp. rate 17, height '5\' 11"'$  (1.803 m), weight 56.7 kg (125 lb), SpO2 96 %.   Intake/Output Summary (Last 24 hours) at 07/29/15 0758 Last data filed at 07/29/15 0022  Gross per 24 hour  Intake    240 ml  Output   1500 ml  Net  -1260 ml    Exam Awake Alert, Oriented x 3, No new F.N deficits, Normal affect .AT,PERRAL Supple Neck,No JVD, No cervical lymphadenopathy appriciated.  Symmetrical Chest wall movement, Good air movement bilaterally, CTAB RRR,No Gallops,Rubs or new Murmurs, No Parasternal Heave +ve B.Sounds, Abd Soft, Non tender, No organomegaly appriciated, No rebound -guarding or rigidity. No Cyanosis, Clubbing or edema, No new  Rash or bruise  Data Review   CBC w Diff: Lab Results  Component Value Date   WBC 8.1 07/28/2015   WBC 9.5 07/29/2012   HGB 8.8* 07/28/2015   HGB 13.1 07/29/2012   HCT 27.8* 07/28/2015   HCT 37.5* 07/29/2012   PLT 235 07/28/2015   PLT 162 08/02/2012   LYMPHOPCT 6 07/26/2015   LYMPHOPCT 14.4 07/29/2012   MONOPCT 6 07/26/2015   MONOPCT 8.1 07/29/2012   EOSPCT 0 07/26/2015   EOSPCT 0.1 07/29/2012   BASOPCT 0 07/26/2015   BASOPCT 0.2 07/29/2012    CMP: Lab Results  Component Value Date   NA 145 07/29/2015   NA 143 08/02/2012   K 3.7 07/29/2015   K 3.9 08/02/2012   CL 116* 07/29/2015   CL 111* 08/02/2012   CO2 26 07/29/2015   CO2 24 08/02/2012   BUN 11 07/29/2015   BUN 13 08/02/2012   CREATININE 0.83 07/29/2015   CREATININE 1.13 08/02/2012   PROT 6.3* 07/26/2015   PROT 7.0 07/27/2012   ALBUMIN 2.3* 07/26/2015   ALBUMIN 2.9* 07/27/2012   BILITOT 0.4 07/26/2015   BILITOT 0.3 07/27/2012   ALKPHOS 187* 07/26/2015   ALKPHOS 75 07/27/2012   AST 26 07/26/2015   AST 43* 07/27/2012   ALT 15* 07/26/2015   ALT 25 07/27/2012  .   Total  Time in preparing paper work, data evaluation and todays exam - 76 minutes  Malone Vanblarcom M.D on 07/29/2015 at 7:58 AM    Note: This dictation was prepared with Dragon dictation along with smaller phrase technology. Any transcriptional errors that result from this process are unintentional.

## 2015-07-29 NOTE — Progress Notes (Signed)
[place] at [place] NAME: Marcus [name]    MR#:  [id number]  DATE OF BIRTH:  08-23-1946  SUBJECTIVE: Patient denies any complaints. Afebrile. Urine culture showing ESBL. Her PICC line yesterday. Unable to discharge over the weekend as FL2 is not available yet.  CHIEF COMPLAINT:   Chief Complaint  Patient presents with  . Wound Infection   Feeling well. Eating breakfast. Denies pain  REVIEW OF SYSTEMS:   Review of Systems  Constitutional: Negative for fever.  Respiratory: Negative for shortness of breath.   Cardiovascular: Negative for chest pain and palpitations.  Gastrointestinal: Negative for nausea, vomiting and abdominal pain.  Genitourinary: Negative for dysuria.   DRUG ALLERGIES:  No Known Allergies  VITALS:  Blood pressure 107/62, pulse 92, temperature 97.8 F (36.6 C), temperature source Oral, resp. rate 17, height '5\' 11"'$  (1.803 m), weight 56.7 kg (125 lb), SpO2 96 %.  PHYSICAL EXAMINATION:  GENERAL:  69 y.o.-year-old patient lying in the bed with no acute distress. Thin  LUNGS: Normal breath sounds bilaterally, no wheezing, rales, rhonchi or crepitation. No use of accessory muscles of respiration.  CARDIOVASCULAR: S1, S2 normal. No murmurs, rubs, or gallops.  ABDOMEN: Soft, nontender, nondistended. Bowel sounds present. No organomegaly or mass. No guarding no rebound EXTREMITIES: No pedal edema, cyanosis, or clubbing.  NEUROLOGIC: Cranial nerves II through XII are intact. Muscle strength 4+ /5 in all extremities. Sensation intact. Gait not checked.  PSYCHIATRIC: The patient is alert and oriented x 3.  SKIN: Multiple ulcers over the lower legs and sacrum  LABORATORY PANEL:   CBC  Recent Labs Lab 07/28/15 0458  WBC 8.1  HGB 8.8*  HCT 27.8*  PLT 235   ------------------------------------------------------------------------------------------------------------------  Chemistries   Recent Labs Lab  07/26/15 1708  07/29/15 0555  NA 139  < > 145  K 4.4  < > 3.7  CL 107  < > 116*  CO2 25  < > 26  GLUCOSE 146*  < > 148*  BUN 20  < > 11  CREATININE 1.38*  < > 0.83  CALCIUM 8.7*  < > 8.5*  AST 26  --   --   ALT 15*  --   --   ALKPHOS 187*  --   --   BILITOT 0.4  --   --   < > = values in this interval not displayed. ------------------------------------------------------------------------------------------------------------------  Cardiac Enzymes No results for input(s): TROPONINI in the last 168 hours. ------------------------------------------------------------------------------------------------------------------  RADIOLOGY:  Dg Chest Port 1 View  07/28/2015  CLINICAL DATA:  Status post PICC placement EXAM: PORTABLE CHEST 1 VIEW COMPARISON:  07/26/2015 FINDINGS: Right PICC tip projects in the lower superior vena cava just above the caval atrial junction, well positioned. Lungs are hyperexpanded. No lung consolidation or edema. No pleural effusion or pneumothorax. Mild scarring at the left lung base. Status post CABG surgery, stable. Cardiac silhouette is normal size. No mediastinal or hilar masses or convincing adenopathy. IMPRESSION: 1. Right PICC is well positioned with its tip just above the caval atrial junction. 2. No acute cardiopulmonary disease. Electronically Signed   By: Lajean Manes M.D.   On: 07/28/2015 14:43    EKG:   Orders placed or performed during the hospital encounter of 07/26/15  . ED EKG 12-Lead  . ED EKG 12-Lead    ASSESSMENT AND PLAN:   69 year old male with past medical history of diabetes, hypertension, hypothyroidism, history of Crohn's disease status post bypass, previous CVA,  bipolar disorder who presented to the hospital due to fever and malaise and suspected to have sepsis.  #1 sepsis - Resolved, no longer tachycardic, febrile or hypotensive, white count is normal .  discontinue IV fluids. -ESBL UTI. Blood cultures are negative for 2  days.  #2 UTI -Due to have ESBL UTI multidrug resistant. Started ERTAPENEM with PICC line. #3  diabetes type 2 diabetic neuropathy - continue SSI -continue Neurontin.  #4 bipolar disorder: stable -continue Depakote.  #5 hypothyroidism-continue Synthroid.  #6 hypertension-hypotensive today. Changed the dose of metoprolol.  #7 multiple decubitus ulcers over left and right feet left thigh and sacrum; present  on admission. - Care per wound care team,  8.deconditioning.PT CONSULT. All the records are reviewed and case discussed with Care Management/Social Workerr. Management plans discussed with the patient, family and they are in agreement.  CODE STATUS: DNR  TOTAL TIME TAKING CARE OF THIS PATIENT: 35 minutes.  Greater than 50% of time spent in care coordination and counseling. POSSIBLE D/C IN 1 DAYS, DEPENDING ON CLINICAL CONDITION.   Epifanio Lesches M.D on 07/29/2015 at 9:20 AM  Between 7am to 6pm - Pager - [phone]  After 6pm go to [email or web address] - password [email or web address] [facility]  [place] [facility]  Office  [phone]  CC: Primary care physician; [name], MD

## 2015-07-29 NOTE — H&P (Signed)
Pt alert, oriented to person and place, needs assistance with feeding, bathing, dressing, and toileting. Does not complain of pain or discomfort. Will continue to monitor vitals. All dressings changed today. Picc line has good blood return on both ports and flushes with ease.

## 2015-07-30 LAB — BASIC METABOLIC PANEL
ANION GAP: 3 — AB (ref 5–15)
BUN: 15 mg/dL (ref 6–20)
CALCIUM: 8.3 mg/dL — AB (ref 8.9–10.3)
CO2: 26 mmol/L (ref 22–32)
Chloride: 109 mmol/L (ref 101–111)
Creatinine, Ser: 0.98 mg/dL (ref 0.61–1.24)
GLUCOSE: 197 mg/dL — AB (ref 65–99)
POTASSIUM: 4.2 mmol/L (ref 3.5–5.1)
Sodium: 138 mmol/L (ref 135–145)

## 2015-07-30 LAB — GLUCOSE, CAPILLARY
GLUCOSE-CAPILLARY: 187 mg/dL — AB (ref 65–99)
GLUCOSE-CAPILLARY: 195 mg/dL — AB (ref 65–99)
GLUCOSE-CAPILLARY: 166 mg/dL — AB (ref 65–99)
Glucose-Capillary: 122 mg/dL — ABNORMAL HIGH (ref 65–99)

## 2015-07-30 LAB — MAGNESIUM: Magnesium: 1.5 mg/dL — ABNORMAL LOW (ref 1.7–2.4)

## 2015-07-30 MED ORDER — ALBUTEROL SULFATE (2.5 MG/3ML) 0.083% IN NEBU: 2.5000 mg | INHALATION_SOLUTION | RESPIRATORY_TRACT | Status: DC | PRN

## 2015-07-30 MED ORDER — ALBUTEROL SULFATE (2.5 MG/3ML) 0.083% IN NEBU
2.5000 mg | INHALATION_SOLUTION | Freq: Four times a day (QID) | RESPIRATORY_TRACT | Status: DC
  Administered 2015-07-30 – 2015-07-31 (×4): 2.5 mg via RESPIRATORY_TRACT
  Filled 2015-07-30 (×5): qty 3

## 2015-07-30 NOTE — Plan of Care (Addendum)
No c/o pain, resting comfortably in bed. Q2H turning. VSS, afebrile. Unknown when last BM was, stool softner given as scheduled.   Problem: Safety: Goal: Ability to remain free from injury will improve Outcome: Progressing High fall risk. Bed alarm on, hourly rounding. Q2H turning. Safe environment provided.      Problem: Skin Integrity: Goal: Risk for impaired skin integrity will decrease Outcome: Progressing Skin care provided. Incontinent at times. Multiple pressure ulcers w/ daily dressing changes. Foam dry & intact.

## 2015-07-30 NOTE — Progress Notes (Signed)
[place] at [place] NAME: Marcus [name]    MR#:  [id number]  DATE OF BIRTH:  1946-07-23  SUBJECTIVE: Patient denies any complaints. Afebrile. Urine culture showing ESBL. Marland Kitchen Unable to discharge over the weekend as FL2 is not available yet.patient is hypotensive today.  CHIEF COMPLAINT:   Chief Complaint  Patient presents with  . Wound Infection     REVIEW OF SYSTEMS:   Review of Systems  Constitutional: Negative for fever.  Respiratory: Negative for shortness of breath.   Cardiovascular: Negative for chest pain and palpitations.  Gastrointestinal: Negative for nausea, vomiting and abdominal pain.  Genitourinary: Negative for dysuria.   DRUG ALLERGIES:  No Known Allergies  VITALS:  Blood pressure 92/55, pulse 84, temperature 98.4 F (36.9 C), temperature source Oral, resp. rate 17, height '5\' 11"'$  (1.803 m), weight 56.7 kg (125 lb), SpO2 100 %.  PHYSICAL EXAMINATION:  GENERAL:  69 y.o.-year-old patient lying in the bed with no acute distress. Thin  LUNGS: Normal breath sounds bilaterally, no wheezing, rales, rhonchi or crepitation. No use of accessory muscles of respiration.  CARDIOVASCULAR: S1, S2 normal. No murmurs, rubs, or gallops.  ABDOMEN: Soft, nontender, nondistended. Bowel sounds present. No organomegaly or mass. No guarding no rebound EXTREMITIES: No pedal edema, cyanosis, or clubbing.  NEUROLOGIC: Cranial nerves II through XII are intact. Muscle strength 4+ /5 in all extremities. Sensation intact. Gait not checked.  PSYCHIATRIC: The patient is alert and oriented x 3.  SKIN: Multiple ulcers over the lower legs and sacrum  LABORATORY PANEL:   CBC  Recent Labs Lab 07/28/15 0458  WBC 8.1  HGB 8.8*  HCT 27.8*  PLT 235   ------------------------------------------------------------------------------------------------------------------  Chemistries   Recent Labs Lab 07/26/15 1708  07/29/15 0555  NA 139  <  > 145  K 4.4  < > 3.7  CL 107  < > 116*  CO2 25  < > 26  GLUCOSE 146*  < > 148*  BUN 20  < > 11  CREATININE 1.38*  < > 0.83  CALCIUM 8.7*  < > 8.5*  AST 26  --   --   ALT 15*  --   --   ALKPHOS 187*  --   --   BILITOT 0.4  --   --   < > = values in this interval not displayed. ------------------------------------------------------------------------------------------------------------------  Cardiac Enzymes No results for input(s): TROPONINI in the last 168 hours. ------------------------------------------------------------------------------------------------------------------  RADIOLOGY:  Dg Chest Port 1 View  07/28/2015  CLINICAL DATA:  Status post PICC placement EXAM: PORTABLE CHEST 1 VIEW COMPARISON:  07/26/2015 FINDINGS: Right PICC tip projects in the lower superior vena cava just above the caval atrial junction, well positioned. Lungs are hyperexpanded. No lung consolidation or edema. No pleural effusion or pneumothorax. Mild scarring at the left lung base. Status post CABG surgery, stable. Cardiac silhouette is normal size. No mediastinal or hilar masses or convincing adenopathy. IMPRESSION: 1. Right PICC is well positioned with its tip just above the caval atrial junction. 2. No acute cardiopulmonary disease. Electronically Signed   By: Lajean Manes M.D.   On: 07/28/2015 14:43    EKG:   Orders placed or performed during the hospital encounter of 07/26/15  . ED EKG 12-Lead  . ED EKG 12-Lead    ASSESSMENT AND PLAN:   69 year old male with past medical history of diabetes, hypertension, hypothyroidism, history of Crohn's disease status post bypass, previous CVA, bipolar disorder who presented to  the hospital due to fever and malaise and suspected to have sepsis.  #1 sepsis - Resolved, no longer tachycardic, febrile or hypotensive, white count is normal .  discontinue IV fluids. -ESBL UTI. Blood cultures are negative for 2 days.stop date for ABX  Is NOv   25th. #2  UTI -Due to have ESBL UTI multidrug resistant. Started ERTAPENEM with PICC line. #3  diabetes type 2 diabetic neuropathy - continue SSI -continue Neurontin.  #4 bipolar disorder: stable -continue Depakote.  #5 hypothyroidism-continue Synthroid.  #6 hypertension-hypotensive today. Discontinue metoprolol. #7 multiple decubitus ulcers over left and right feet left thigh and sacrum; present  on admission. - Care per wound care team,  8.deconditioning.PT CONSULT. All the records are reviewed and case discussed with Care Management/Social Workerr. Management plans discussed with the patient, family and they are in agreement.  CODE STATUS: DNR  TOTAL TIME TAKING CARE OF THIS PATIENT: 35 minutes.  Greater than 50% of time spent in care coordination and counseling. POSSIBLE D/C IN 1 DAYS, DEPENDING ON CLINICAL CONDITION.   Epifanio Lesches M.D on 07/30/2015 at 11:41 AM  Between 7am to 6pm - Pager - [phone]  After 6pm go to [email or web address] - password [email or web address] [facility]  [place] [facility]  Office  [phone]  CC: Primary care physician; [name], MD

## 2015-07-30 NOTE — Progress Notes (Signed)
At 1342 pt had 4  Runs of V-Tach in 1 minute per telemetry. MD notified, vitals taken, EKG performed, labs drawn. Pt stated he did not feel any different, he was sitting in bed watching tv at time of event.

## 2015-07-31 LAB — CULTURE, BLOOD (ROUTINE X 2)
CULTURE: NO GROWTH
Culture: NO GROWTH

## 2015-07-31 LAB — GLUCOSE, CAPILLARY
GLUCOSE-CAPILLARY: 359 mg/dL — AB (ref 65–99)
Glucose-Capillary: 81 mg/dL (ref 65–99)
Glucose-Capillary: 83 mg/dL (ref 65–99)

## 2015-07-31 MED ORDER — ALBUTEROL SULFATE (2.5 MG/3ML) 0.083% IN NEBU: 2.5000 mg | INHALATION_SOLUTION | RESPIRATORY_TRACT | Status: AC | PRN

## 2015-07-31 NOTE — Clinical Social Work Note (Signed)
Pt ready for discharge today to [facility]. [facility] obtained. [facility] approved EMS transport. RN called reported. Family is in agreement. CSW is signing off as no further needs identified.   Darden Dates, MSW, LCSW Clinical Social Worker [phone]

## 2015-07-31 NOTE — Discharge Summary (Signed)
Marcus [name], is a 69 y.o. male  DOB [birth year]  MRN [id number].  Admission date:  07/26/2015  Admitting Physician  Henreitta Leber, MD  Discharge Date:  07/31/2015   Primary MD  [name], MD  Recommendations for primary care physician for things to follow:  Follow up with [facility] pro gramme physician in one week   Admission Diagnosis  SIRS (systemic inflammatory response syndrome) ([place]) [R65.10] Sepsis ([place]) [A41.9]   Discharge Diagnosis  SIRS (systemic inflammatory response syndrome) ([place]) [R65.10] Sepsis ([place]) [A41.9]    Active Problems:   Sepsis ([place])   Pressure ulcer      Past Medical History  Diagnosis Date  . Diabetes ([place])   . Vascular dementia   . Bipolar 1 disorder ([place])   . Stroke ([place]) 1998  . Heart disease   . CAD (coronary artery disease)   . Hypertension   . Shortness of breath dyspnea   . Hypothyroidism   . Tuberculosis 2015  . Cancer [facility])     Past Surgical History  Procedure Laterality Date  . Cardiac bypass    . Coronary artery bypass graft    . Appendectomy    . Compression hip screw Left 06/07/2015    Procedure: left hip nailing with DHS;  Surgeon: Earnestine Leys, MD;  Location: [facility] ORS;  Service: Orthopedics;  Laterality: Left;       History of present illness and  Hospital Course:     Kindly see H&P for history of present illness and admission details, please review complete Labs, Consult reports and Test reports for all details in brief  HPI  from the history and physical done on the day of admission  69 year old male with diabetes mellitus type 2, dementia, bipolar disorder, history of CVA, hypothyroidism, hypertension came in from [place] [facility] secondary to fever of 101 Fahrenheit and not feeling well. Lactate level 2.9. Admitted for sepsis.  Hospital  Course  #1 sepsis with lactic acidosis: Patient chest x-ray did not show pneumonia. Urine cultures showed ESBL UTI. Initially urine cultures so far are pending so patient received his Rocephin. But antibiotics are changed to Invanz and PICC line is inserted. Plan is to continue IV Invanz for 14 days for ESBL UTI. End Date is November 26. Patient has no fever at this time. disPosition to [facility]. 2 history of bipolar disorder patient takes about 150 mg of Depakote at bedtime. #3. Hypotension requiring discontinuation of metoprolol. #4 history of diabetes and diabetic neuropathy: And only on sliding scale with coverage,  Patient takes Neurontin for neuropathy. 5 pressure ulcers to sacrum, left ischium right heel, right ischium, left thigh. Patient is seen by wound care, use calcium alternate for sacral ulcers. getting silicone border foam dressing, for right heel, left ankle, left  Thigh.. change the dressing every 3 days, patient does  Have PREVALON boots to offload the heel pressure.  Discharge Condition: stable   Follow UP      Follow-up Information    Follow up with [name], MD In 1 week.   Specialty:  Internal Medicine   Contact information:   [facility] [address] [place] [address] [phone]         Discharge Instructions  and  Discharge Medications        Medication List    STOP taking these medications        dextromethorphan 30 MG/5ML liquid  Commonly known as:  DELSYM  doxycycline 100 MG tablet  Commonly known as:  VIBRA-TABS     levofloxacin 500 MG tablet  Commonly known as:  LEVAQUIN     loperamide 2 MG tablet  Commonly known as:  IMODIUM A-D     metoprolol succinate 50 MG 24 hr tablet  Commonly known as:  TOPROL-XL      TAKE these medications        acetaminophen 325 MG tablet  Commonly known as:  TYLENOL  Take 650 mg by mouth every 4 (four) hours as needed for mild pain, fever or headache.      albuterol (2.5 MG/3ML) 0.083% nebulizer solution  Commonly known as:  PROVENTIL  Take 3 mLs (2.5 mg total) by nebulization every 4 (four) hours.     albuterol (2.5 MG/3ML) 0.083% nebulizer solution  Commonly known as:  PROVENTIL  Take 3 mLs (2.5 mg total) by nebulization every 4 (four) hours as needed for wheezing or shortness of breath.     alum & mag hydroxide-simeth 200-200-20 MG/5ML suspension  Commonly known as:  MAALOX/MYLANTA  Take 30 mLs by mouth every 4 (four) hours as needed for indigestion.     aspirin EC 81 MG tablet  Take 81 mg by mouth daily.     atorvastatin 40 MG tablet  Commonly known as:  LIPITOR  Take 40 mg by mouth at bedtime.     bisacodyl 5 MG EC tablet  Commonly known as:  DULCOLAX  Take 1 tablet (5 mg total) by mouth daily as needed for moderate constipation.     cadexomer iodine 0.9 % gel  Commonly known as:  IODOSORB  Apply 1 application topically daily as needed for wound care (use with dressing changes every other day and as needed).     divalproex 250 MG 24 hr tablet  Commonly known as:  DEPAKOTE ER  Take 250 mg by mouth at bedtime. Pt takes with two '500mg'$  tablets.     divalproex 500 MG 24 hr tablet  Commonly known as:  DEPAKOTE ER  Take 1,000 mg by mouth at bedtime.     ertapenem 1 g in sodium chloride 0.9 % 50 mL  Inject 1 g into the vein daily.     feeding supplement (ENSURE ENLIVE) Liqd  Take 237 mLs by mouth 2 (two) times daily between meals.     ferrous sulfate 325 (65 FE) MG tablet  Take 1 tablet (325 mg total) by mouth daily with breakfast.     gabapentin 600 MG tablet  Commonly known as:  NEURONTIN  Take 600 mg by mouth at bedtime.     insulin aspart 100 UNIT/ML injection  Commonly known as:  novoLOG  Inject 0-9 Units into the skin 3 (three) times daily with meals.     levothyroxine 75 MCG tablet  Commonly known as:  SYNTHROID, LEVOTHROID  Take 75 mcg by mouth daily before breakfast.     magnesium hydroxide 400 MG/5ML  suspension  Commonly known as:  MILK OF MAGNESIA  Take 30 mLs by mouth daily as needed for mild constipation.     methocarbamol 500 MG tablet  Commonly known as:  ROBAXIN  Take 1 tablet (500 mg total) by mouth every 6 (six) hours as needed for muscle spasms.     OLANZapine 20 MG tablet  Commonly known as:  ZYPREXA  Take 20 mg by mouth at bedtime.     psyllium 0.52 G capsule  Commonly known as:  REGULOID  Take 0.52 g by  mouth daily.     senna 8.6 MG Tabs tablet  Commonly known as:  SENOKOT  Take 1 tablet (8.6 mg total) by mouth 2 (two) times daily.     vitamin A & D ointment  Apply 1 application topically 2 (two) times daily as needed (barrier).     Vitamin D (Ergocalciferol) 50000 UNITS Caps capsule  Commonly known as:  DRISDOL  Take 50,000 Units by mouth every 30 (thirty) days.          Diet and Activity recommendation: See Discharge Instructions above   Consults obtained - wound care    Major procedures and Radiology Reports - PLEASE review detailed and final reports for all details, in brief -     Dg Chest Port 1 View  07/28/2015  CLINICAL DATA:  Status post PICC placement EXAM: PORTABLE CHEST 1 VIEW COMPARISON:  07/26/2015 FINDINGS: Right PICC tip projects in the lower superior vena cava just above the caval atrial junction, well positioned. Lungs are hyperexpanded. No lung consolidation or edema. No pleural effusion or pneumothorax. Mild scarring at the left lung base. Status post CABG surgery, stable. Cardiac silhouette is normal size. No mediastinal or hilar masses or convincing adenopathy. IMPRESSION: 1. Right PICC is well positioned with its tip just above the caval atrial junction. 2. No acute cardiopulmonary disease. Electronically Signed   By: Lajean Manes M.D.   On: 07/28/2015 14:43   Dg Chest Port 1 View  07/26/2015  CLINICAL DATA:  Code sepsis, sacral ulcer, personal history of diabetes mellitus, stroke, hypertension, coronary artery disease, dementia,  TB, lung cancer EXAM: PORTABLE CHEST 1 VIEW COMPARISON:  Portable exam 1712 hours compared to 06/06/2015 FINDINGS: Normal heart size post CABG. Mediastinal contours and pulmonary vascularity normal. Lungs emphysematous with minimal chronic accentuation of LEFT upper lobe markings stable. No acute infiltrate, pleural effusion or pneumothorax. Bones demineralized. IMPRESSION: COPD changes with minimal atelectasis or scarring LEFT upper lobe. No acute abnormalities. Electronically Signed   By: [name]   On: 07/26/2015 17:27    Micro Results    Recent Results (from the past 240 hour(s))  Culture, blood (routine x 2)     Status: None (Preliminary result)   Collection Time: 07/26/15  5:01 PM  Result Value Ref Range Status   Specimen Description BLOOD RIGHT ARM  Final   Special Requests BOTTLES DRAWN AEROBIC AND ANAEROBIC 5CC  Final   Culture NO GROWTH 4 DAYS  Final   Report Status PENDING  Incomplete  Culture, blood (routine x 2)     Status: None (Preliminary result)   Collection Time: 07/26/15  5:01 PM  Result Value Ref Range Status   Specimen Description BLOOD RIGHT ASSIST CONTROL  Final   Special Requests BOTTLES DRAWN AEROBIC AND ANAEROBIC 5CC  Final   Culture NO GROWTH 4 DAYS  Final   Report Status PENDING  Incomplete  Urine culture     Status: None   Collection Time: 07/26/15  5:01 PM  Result Value Ref Range Status   Specimen Description URINE, CLEAN CATCH  Final   Special Requests NONE  Final   Culture   Final    >=100,000 COLONIES/mL ESCHERICHIA COLI ESBL-EXTENDED SPECTRUM BETA LACTAMASE-THE ORGANISM IS RESISTANT TO PENICILLINS, CEPHALOSPORINS AND AZTREONAM ACCORDING TO CLSI M100-S15 VOL.[place]. CRITICAL RESULT CALLED TO, READ BACK BY AND VERIFIED WITH: MEGAN OAKLEY,RN 07/28/2015 0801 BY JRS. >=100,000 COLONIES/mL GROUP B STREP(S.AGALACTIAE)ISOLATED Virtually 100% of S. agalactiae (Group B) strains are susceptible to Penicillin.  For Penicillin-allergic patients,  Erythromycin (85-95% sensitive) and Clindamycin (80% sensitive) are drugs of choice. Contact microbiology lab to request sensitivities if  needed within 7 days.    Report Status 07/28/2015 FINAL  Final   Organism ID, Bacteria ESCHERICHIA COLI  Final      Susceptibility   Escherichia coli - MIC*    AMPICILLIN >=32 RESISTANT Resistant     CEFAZOLIN >=64 RESISTANT Resistant     CEFTRIAXONE >=64 RESISTANT Resistant     CIPROFLOXACIN >=4 RESISTANT Resistant     GENTAMICIN <=1 SENSITIVE Sensitive     IMIPENEM <=0.25 SENSITIVE Sensitive     NITROFURANTOIN <=16 SENSITIVE Sensitive     TRIMETH/SULFA >=320 RESISTANT Resistant     Extended ESBL POSITIVE Resistant     PIP/TAZO Value in next row Sensitive      SENSITIVE<=4    LEVOFLOXACIN Value in next row Resistant      RESISTANT>=8    * >=100,000 COLONIES/mL ESCHERICHIA COLI  MRSA PCR Screening     Status: None   Collection Time: 07/27/15  6:01 AM  Result Value Ref Range Status   MRSA by PCR NEGATIVE NEGATIVE Final    Comment:        The GeneXpert MRSA Assay (FDA approved for NASAL specimens only), is one component of a comprehensive MRSA colonization surveillance program. It is not intended to diagnose MRSA infection nor to guide or monitor treatment for MRSA infections.        Today   Subjective:   Harless Molinari today has no headache,no chest abdominal pain,no new weakness tingling or numbness, no fever.plan to discharge to [facility] today, Objective:   Blood pressure 103/70, pulse 88, temperature 97.9 F (36.6 C), temperature source Oral, resp. rate 17, height '5\' 11"'$  (1.803 m), weight 56.7 kg (125 lb), SpO2 98 %.   Intake/Output Summary (Last 24 hours) at 07/31/15 0916 Last data filed at 07/30/15 2301  Gross per 24 hour  Intake      0 ml  Output    950 ml  Net   -950 ml    Exam Awake Alert, Oriented x 3, No new F.N deficits, Normal affect [place].AT,PERRAL Supple Neck,No JVD, No cervical lymphadenopathy  appriciated.  Symmetrical Chest wall movement, Good air movement bilaterally, CTAB RRR,No Gallops,Rubs or new Murmurs, No Parasternal Heave +ve B.Sounds, Abd Soft, Non tender, No organomegaly appriciated, No rebound -guarding or rigidity. No Cyanosis, Clubbing or edema, No new Rash or bruise  Data Review   CBC w Diff:  Lab Results  Component Value Date   WBC 8.1 07/28/2015   WBC 9.5 07/29/2012   HGB 8.8* 07/28/2015   HGB 13.1 07/29/2012   HCT 27.8* 07/28/2015   HCT 37.5* 07/29/2012   PLT 235 07/28/2015   PLT 162 08/02/2012   LYMPHOPCT 6 07/26/2015   LYMPHOPCT 14.4 07/29/2012   MONOPCT 6 07/26/2015   MONOPCT 8.1 07/29/2012   EOSPCT 0 07/26/2015   EOSPCT 0.1 07/29/2012   BASOPCT 0 07/26/2015   BASOPCT 0.2 07/29/2012    CMP:  Lab Results  Component Value Date   NA 138 07/30/2015   NA 143 08/02/2012   K 4.2 07/30/2015   K 3.9 08/02/2012   CL 109 07/30/2015   CL 111* 08/02/2012   CO2 26 07/30/2015   CO2 24 08/02/2012   BUN 15 07/30/2015   BUN 13 08/02/2012   CREATININE 0.98 07/30/2015   CREATININE 1.13 08/02/2012   PROT 6.3* 07/26/2015   PROT 7.0 07/27/2012  ALBUMIN 2.3* 07/26/2015   ALBUMIN 2.9* 07/27/2012   BILITOT 0.4 07/26/2015   BILITOT 0.3 07/27/2012   ALKPHOS 187* 07/26/2015   ALKPHOS 75 07/27/2012   AST 26 07/26/2015   AST 43* 07/27/2012   ALT 15* 07/26/2015   ALT 25 07/27/2012  .   Total Time in preparing paper work, data evaluation and todays exam - 29 minutes  Selden Noteboom M.D on 07/31/2015 at 9:16 AM    Note: This dictation was prepared with Dragon dictation along with smaller phrase technology. Any transcriptional errors that result from this process are unintentional.

## 2015-07-31 NOTE — Care Management Important Message (Signed)
Important Message  Patient Details  Name: Marcus [name] MRN: [id number] Date of Birth: 28-Feb-1946   Medicare Important Message Given:  Yes    Shelbie Ammons, RN 07/31/2015, 11:04 AM

## 2015-07-31 NOTE — Plan of Care (Addendum)
No c/o pain. VSS, afebrile. NSR on telemetry. Unknown when last BM was, stool softner given as scheduled.   Problem: Safety: Goal: Ability to remain free from injury will improve Outcome: Progressing High fall risk. Bed alarm on, hourly rounding. Q2H turning. Safe environment provided.    Problem: Skin Integrity: Goal: Risk for impaired skin integrity will decrease Outcome: Progressing Skin care provided. Incontinent at times. Multiple pressure ulcers w/ daily dressing changes. Foam dry & intact.   Problem: Safety: Goal: Ability to remain free from injury will improve Outcome: Progressing High fall risk. Bed alarm on, hourly rounding. Q2H turning. Safe environment provided.    Problem: Skin Integrity: Goal: Risk for impaired skin integrity will decrease Skin care provided. Incontinent at times. Multiple pressure ulcers w/ daily dressing changes. Foam dry & intact.

## 2015-07-31 NOTE — Progress Notes (Signed)
Called report to RN at [facility].  Pt's IV removed but PICC will not be removed as pt on IV Invance at facility.  EMS called for transport.  Clarise Cruz, RN

## 2015-07-31 NOTE — NC FL2 (Signed)
[place] LEVEL OF CARE SCREENING TOOL     IDENTIFICATION  Patient Name: Marcus [name] Birthdate: 1946/04/17 Sex: male Admission Date (Current Location): 07/26/2015  [facility] [place] Number: Engineering geologist and Address:  [facility], [address] [place],  [address]      Provider Number: [id number]  Attending Physician Name and Address:  Epifanio Lesches, MD  Relative Name and Phone Number:       Current Level of Care: Hospital Recommended Level of Care: [place] Prior Approval Number:    Date Approved/Denied:   PASRR Number:    Discharge Plan: SNF    Current Diagnoses: Patient Active Problem List   Diagnosis Date Noted  . Pressure ulcer 07/28/2015  . Sepsis ([place]) 07/26/2015  . Infection of urinary tract 06/09/2015  . Fever presenting with conditions classified elsewhere 06/09/2015  . Anemia 06/09/2015  . Hypernatremia 06/09/2015  . Lung cancer ([place]) 06/09/2015  . Encephalopathy, metabolic 49/82/6415  . Closed left hip fracture ([place]) 06/06/2015  . Malignant neoplasm of upper lobe of left lung ([place])   . History of chest tube placement   . Pneumothorax after biopsy 04/20/2015    Orientation ACTIVITIES/SOCIAL BLADDER RESPIRATION    Self, Time, Situation, Place  Passive Incontinent Normal  BEHAVIORAL SYMPTOMS/MOOD NEUROLOGICAL BOWEL NUTRITION STATUS      Incontinent Diet (Heart Health, Thin Liquids)  PHYSICIAN VISITS COMMUNICATION OF NEEDS Height & Weight Skin  30 days Verbally   125 lbs. PU Stage and Appropriate Care          AMBULATORY STATUS RESPIRATION    Assist extensive Normal      Personal Care Assistance Level of Assistance  Bathing, Dressing, Feeding Bathing Assistance: Limited assistance Feeding assistance: Independent Dressing Assistance: Limited assistance      Functional Limitations Info  Hearing, Speech   Hearing Info: Impaired Speech Info: Impaired        SPECIAL CARE FACTORS FREQUENCY  PT (By licensed PT)                   Additional Factors Info  Code Status Code Status Info: DNR             Current Medications (07/31/2015): Current Facility-Administered Medications  Medication Dose Route Frequency Provider Last Rate Last Dose  . acetaminophen (TYLENOL) tablet 650 mg  650 mg Oral Q6H PRN Henreitta Leber, MD       Or  . acetaminophen (TYLENOL) suppository 650 mg  650 mg Rectal Q6H PRN Henreitta Leber, MD      . acetaminophen (TYLENOL) tablet 650 mg  650 mg Oral Q4H PRN Henreitta Leber, MD   650 mg at 07/26/15 2159  . albuterol (PROVENTIL) (2.5 MG/3ML) 0.083% nebulizer solution 2.5 mg  2.5 mg Nebulization Q4H PRN Epifanio Lesches, MD      . alum & mag hydroxide-simeth (MAALOX/MYLANTA) 200-200-20 MG/5ML suspension 30 mL  30 mL Oral Q4H PRN Henreitta Leber, MD      . aspirin EC tablet 81 mg  81 mg Oral Daily Henreitta Leber, MD   81 mg at 07/31/15 0903  . atorvastatin (LIPITOR) tablet 40 mg  40 mg Oral QHS Henreitta Leber, MD   40 mg at 07/30/15 2254  . bisacodyl (DULCOLAX) EC tablet 5 mg  5 mg Oral Daily PRN Henreitta Leber, MD      . divalproex (DEPAKOTE ER) 24 hr tablet 1,000 mg  1,000 mg Oral  QHS Henreitta Leber, MD   1,000 mg at 07/30/15 2255  . divalproex (DEPAKOTE ER) 24 hr tablet 250 mg  250 mg Oral QHS Henreitta Leber, MD   250 mg at 07/30/15 2255  . enoxaparin (LOVENOX) injection 40 mg  40 mg Subcutaneous Q24H Epifanio Lesches, MD   40 mg at 07/30/15 2255  . ertapenem (INVANZ) 1 g in sodium chloride 0.9 % 50 mL IVPB  1 g Intravenous Q24H Vena Rua, RPH   1 g at 07/31/15 2836  . feeding supplement (ENSURE ENLIVE) (ENSURE ENLIVE) liquid 237 mL  237 mL Oral BID BM Henreitta Leber, MD   237 mL at 07/31/15 0914  . ferrous sulfate tablet 325 mg  325 mg Oral Q breakfast Henreitta Leber, MD   325 mg at 07/31/15 0903  . gabapentin (NEURONTIN) tablet 600 mg  600 mg Oral QHS Henreitta Leber, MD   600 mg at  07/30/15 2254  . insulin aspart (novoLOG) injection 0-5 Units  0-5 Units Subcutaneous QHS Henreitta Leber, MD   0 Units at 07/27/15 2311  . insulin aspart (novoLOG) injection 0-9 Units  0-9 Units Subcutaneous TID WC Henreitta Leber, MD   9 Units at 07/31/15 1237  . levothyroxine (SYNTHROID, LEVOTHROID) tablet 75 mcg  75 mcg Oral QAC breakfast Henreitta Leber, MD   75 mcg at 07/31/15 0903  . loperamide (IMODIUM) capsule 2 mg  2 mg Oral QID PRN Henreitta Leber, MD      . magnesium hydroxide (MILK OF MAGNESIA) suspension 30 mL  30 mL Oral Daily PRN Henreitta Leber, MD      . methocarbamol (ROBAXIN) tablet 500 mg  500 mg Oral Q6H PRN Henreitta Leber, MD      . OLANZapine (ZYPREXA) tablet 20 mg  20 mg Oral QHS Henreitta Leber, MD   20 mg at 07/30/15 2255  . ondansetron (ZOFRAN) tablet 4 mg  4 mg Oral Q6H PRN Henreitta Leber, MD       Or  . ondansetron (ZOFRAN) injection 4 mg  4 mg Intravenous Q6H PRN Henreitta Leber, MD      . psyllium (HYDROCIL/METAMUCIL) packet 1 packet  1 packet Oral Daily Henreitta Leber, MD   1 packet at 07/31/15 [phone]  . senna (SENOKOT) tablet 8.6 mg  1 tablet Oral BID Henreitta Leber, MD   8.6 mg at 07/31/15 0903  . sodium chloride 0.9 % injection 3 mL  3 mL Intravenous Q12H Henreitta Leber, MD   3 mL at 07/31/15 0914  . vitamin A & D ointment 1 application  1 application Topical BID PRN Henreitta Leber, MD   1 application at 76/54/65 [phone]  . Vitamin D (Ergocalciferol) (DRISDOL) capsule 50,000 Units  50,000 Units Oral Q30 days Henreitta Leber, MD   50,000 Units at 07/26/15 2202   Do not use this list as official medication orders. Please verify with discharge summary.  Discharge Medications:   Medication List    STOP taking these medications        dextromethorphan 30 MG/5ML liquid  Commonly known as:  DELSYM     doxycycline 100 MG tablet  Commonly known as:  VIBRA-TABS     levofloxacin 500 MG tablet  Commonly known as:  LEVAQUIN     loperamide 2 MG tablet   Commonly known as:  IMODIUM A-D     metoprolol succinate 50 MG 24 hr tablet  Commonly known as:  TOPROL-XL      TAKE these medications        acetaminophen 325 MG tablet  Commonly known as:  TYLENOL  Take 650 mg by mouth every 4 (four) hours as needed for mild pain, fever or headache.     albuterol (2.5 MG/3ML) 0.083% nebulizer solution  Commonly known as:  PROVENTIL  Take 3 mLs (2.5 mg total) by nebulization every 4 (four) hours.     albuterol (2.5 MG/3ML) 0.083% nebulizer solution  Commonly known as:  PROVENTIL  Take 3 mLs (2.5 mg total) by nebulization every 4 (four) hours as needed for wheezing or shortness of breath.     alum & mag hydroxide-simeth 200-200-20 MG/5ML suspension  Commonly known as:  MAALOX/MYLANTA  Take 30 mLs by mouth every 4 (four) hours as needed for indigestion.     aspirin EC 81 MG tablet  Take 81 mg by mouth daily.     atorvastatin 40 MG tablet  Commonly known as:  LIPITOR  Take 40 mg by mouth at bedtime.     bisacodyl 5 MG EC tablet  Commonly known as:  DULCOLAX  Take 1 tablet (5 mg total) by mouth daily as needed for moderate constipation.     cadexomer iodine 0.9 % gel  Commonly known as:  IODOSORB  Apply 1 application topically daily as needed for wound care (use with dressing changes every other day and as needed).     divalproex 250 MG 24 hr tablet  Commonly known as:  DEPAKOTE ER  Take 250 mg by mouth at bedtime. Pt takes with two '500mg'$  tablets.     divalproex 500 MG 24 hr tablet  Commonly known as:  DEPAKOTE ER  Take 1,000 mg by mouth at bedtime.     ertapenem 1 g in sodium chloride 0.9 % 50 mL  Inject 1 g into the vein daily.     feeding supplement (ENSURE ENLIVE) Liqd  Take 237 mLs by mouth 2 (two) times daily between meals.     ferrous sulfate 325 (65 FE) MG tablet  Take 1 tablet (325 mg total) by mouth daily with breakfast.     gabapentin 600 MG tablet  Commonly known as:  NEURONTIN  Take 600 mg by mouth at bedtime.      insulin aspart 100 UNIT/ML injection  Commonly known as:  novoLOG  Inject 0-9 Units into the skin 3 (three) times daily with meals.     levothyroxine 75 MCG tablet  Commonly known as:  SYNTHROID, LEVOTHROID  Take 75 mcg by mouth daily before breakfast.     magnesium hydroxide 400 MG/5ML suspension  Commonly known as:  MILK OF MAGNESIA  Take 30 mLs by mouth daily as needed for mild constipation.     methocarbamol 500 MG tablet  Commonly known as:  ROBAXIN  Take 1 tablet (500 mg total) by mouth every 6 (six) hours as needed for muscle spasms.     OLANZapine 20 MG tablet  Commonly known as:  ZYPREXA  Take 20 mg by mouth at bedtime.     psyllium 0.52 G capsule  Commonly known as:  REGULOID  Take 0.52 g by mouth daily.     senna 8.6 MG Tabs tablet  Commonly known as:  SENOKOT  Take 1 tablet (8.6 mg total) by mouth 2 (two) times daily.     vitamin A & D ointment  Apply 1 application topically 2 (two) times daily as needed (barrier).  Vitamin D (Ergocalciferol) 50000 UNITS Caps capsule  Commonly known as:  DRISDOL  Take 50,000 Units by mouth every 30 (thirty) days.        Relevant Imaging Results:  Relevant Lab Results:  Recent Labs    Additional Information No known allergies  Darden Dates, LCSW

## 2015-08-12 ENCOUNTER — Other Ambulatory Visit
Admission: RE | Admit: 2015-08-12 | Discharge: 2015-08-12 | Disposition: A | Discharge status: Home / Self Care | Payer: Medicare (Managed Care) | Source: Ambulatory Visit | Attending: Family Medicine | Admitting: Family Medicine

## 2015-08-12 DIAGNOSIS — R509 Fever, unspecified: Secondary | ICD-10-CM | POA: Diagnosis present

## 2015-08-12 LAB — URINALYSIS COMPLETE WITH MICROSCOPIC (ARMC ONLY)
BACTERIA UA: NONE SEEN
Bilirubin Urine: NEGATIVE
GLUCOSE, UA: 50 mg/dL — AB
Hgb urine dipstick: NEGATIVE
Ketones, ur: NEGATIVE mg/dL
NITRITE: NEGATIVE
Protein, ur: NEGATIVE mg/dL
SPECIFIC GRAVITY, URINE: 1.011 (ref 1.005–1.030)
pH: 6 (ref 5.0–8.0)

## 2015-08-13 ENCOUNTER — Other Ambulatory Visit
Admission: RE | Admit: 2015-08-13 | Discharge: 2015-08-13 | Disposition: A | Discharge status: Home / Self Care | Payer: Medicare (Managed Care) | Source: Ambulatory Visit | Attending: Family Medicine | Admitting: Family Medicine

## 2015-08-13 DIAGNOSIS — E1149 Type 2 diabetes mellitus with other diabetic neurological complication: Secondary | ICD-10-CM | POA: Diagnosis present

## 2015-08-13 DIAGNOSIS — S72052D Unspecified fracture of head of left femur, subsequent encounter for closed fracture with routine healing: Secondary | ICD-10-CM | POA: Diagnosis present

## 2015-08-13 DIAGNOSIS — E785 Hyperlipidemia, unspecified: Secondary | ICD-10-CM | POA: Insufficient documentation

## 2015-08-13 DIAGNOSIS — E039 Hypothyroidism, unspecified: Secondary | ICD-10-CM | POA: Insufficient documentation

## 2015-08-13 DIAGNOSIS — F0151 Vascular dementia with behavioral disturbance: Secondary | ICD-10-CM | POA: Diagnosis not present

## 2015-08-13 LAB — BASIC METABOLIC PANEL
Anion gap: 10 (ref 5–15)
BUN: 24 mg/dL — AB (ref 6–20)
CALCIUM: 9 mg/dL (ref 8.9–10.3)
CO2: 23 mmol/L (ref 22–32)
Chloride: 110 mmol/L (ref 101–111)
Creatinine, Ser: 1.21 mg/dL (ref 0.61–1.24)
GFR calc Af Amer: >60 mL/min (ref >60)
GFR, EST NON AFRICAN AMERICAN: 60 mL/min — AB (ref >60)
GLUCOSE: 187 mg/dL — AB (ref 65–99)
Potassium: 4.7 mmol/L (ref 3.5–5.1)
Sodium: 143 mmol/L (ref 135–145)

## 2015-08-15 ENCOUNTER — Telehealth: Payer: Self-pay

## 2015-08-15 ENCOUNTER — Telehealth: Payer: Self-pay | Admitting: *Deleted

## 2015-08-15 LAB — URINE CULTURE: Culture: 30000

## 2015-08-15 NOTE — Telephone Encounter (Signed)
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

## 2015-08-15 NOTE — Telephone Encounter (Signed)
Santiago Glad from lab called to give critical result of Urine culture stating that patient is VRE +. She gave ordering provider as Dr. Genevive Bi. I read back information given over the phone. Then when looking in to sensitivity information, order was not placed by Dr. Genevive Bi as patient has not seen Dr. Genevive Bi since 04/2015. After researching, discovered patient is residing at [facility] at this time. Called facility and spoke with Neoma Laming who asked for results to be faxed over so that they could get their physician to look at results and order antibiotics for the patient. Neoma Laming read back results that I gave her correctly at this time.  This was faxed to [phone] with Attn: Neoma Laming at this time.

## 2015-08-23 ENCOUNTER — Other Ambulatory Visit: Payer: Self-pay | Admitting: *Deleted

## 2015-08-23 ENCOUNTER — Ambulatory Visit
Admission: RE | Admit: 2015-08-23 | Discharge: 2015-08-23 | Disposition: A | Discharge status: Home / Self Care | Payer: Medicare (Managed Care) | Source: Ambulatory Visit | Attending: Radiation Oncology | Admitting: Radiation Oncology

## 2015-08-23 ENCOUNTER — Encounter: Payer: Self-pay | Admitting: Radiation Oncology

## 2015-08-23 VITALS — BP 106/69 | HR 58 | Temp 96.7°F | Resp 18

## 2015-08-23 DIAGNOSIS — C3412 Malignant neoplasm of upper lobe, left bronchus or lung: Secondary | ICD-10-CM

## 2015-08-23 NOTE — Progress Notes (Signed)
Radiation Oncology Follow up Note  Name: Marcus [name]   Date:   08/23/2015 MRN:  [id number] DOB: May 31, 1946    This 69 y.o. 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 His follow-up was delayed second ray to fracturing his hip. He is seen today in follow-up accompanied by his family member. He has very little side effects pertaining to his radiation therapy. Specifically denies cough hemoptysis or chest tightness.. He has significant comorbidities including vascular dementia bipolar disorder CVA in 1998 coronary artery disease.  COMPLICATIONS OF TREATMENT: none  FOLLOW UP COMPLIANCE: keeps appointments   PHYSICAL EXAM:  BP 106/69 mmHg  Pulse 58  Temp(Src) 96.7 F (35.9 C)  Resp 65  Wt  Wheelchair-bound male with significant neurologic deficits as outlined above. Lungs are clear to A&P. No cervical or supra clavicular adenopathy is appreciated. Abdomen is benign. Cardiac examination is essentially unremarkable.  RADIOLOGY RESULTS: I have ordered a CT scan with contrast in the chest in 2 months.  PLAN: At the present time he is recovering from his hip surgery. He is overall poor performance status. I've ordered a CT scan of his chest with contrast in 2 months and set up a follow-up appointment shortly thereafter for evaluation. He continues close follow-up care with multiple medical providers. Patient is to call my office with any concerns.  I would like to take this opportunity for allowing me to participate in the care of your  patient.Armstead Peaks., MD

## 2015-08-25 ENCOUNTER — Encounter: Payer: Medicare (Managed Care) | Attending: Surgery | Admitting: Surgery

## 2015-08-25 DIAGNOSIS — Z87891 Personal history of nicotine dependence: Secondary | ICD-10-CM | POA: Diagnosis not present

## 2015-08-25 DIAGNOSIS — L89523 Pressure ulcer of left ankle, stage 3: Secondary | ICD-10-CM | POA: Insufficient documentation

## 2015-08-25 DIAGNOSIS — I10 Essential (primary) hypertension: Secondary | ICD-10-CM | POA: Insufficient documentation

## 2015-08-25 DIAGNOSIS — I252 Old myocardial infarction: Secondary | ICD-10-CM | POA: Diagnosis not present

## 2015-08-25 DIAGNOSIS — F0390 Unspecified dementia without behavioral disturbance: Secondary | ICD-10-CM | POA: Insufficient documentation

## 2015-08-25 DIAGNOSIS — E440 Moderate protein-calorie malnutrition: Secondary | ICD-10-CM | POA: Insufficient documentation

## 2015-08-25 DIAGNOSIS — L89323 Pressure ulcer of left buttock, stage 3: Secondary | ICD-10-CM | POA: Insufficient documentation

## 2015-08-25 DIAGNOSIS — E119 Type 2 diabetes mellitus without complications: Secondary | ICD-10-CM | POA: Insufficient documentation

## 2015-08-25 DIAGNOSIS — I2510 Atherosclerotic heart disease of native coronary artery without angina pectoris: Secondary | ICD-10-CM | POA: Diagnosis not present

## 2015-08-25 DIAGNOSIS — I509 Heart failure, unspecified: Secondary | ICD-10-CM | POA: Insufficient documentation

## 2015-08-25 DIAGNOSIS — L89153 Pressure ulcer of sacral region, stage 3: Secondary | ICD-10-CM | POA: Insufficient documentation

## 2015-08-25 DIAGNOSIS — M1990 Unspecified osteoarthritis, unspecified site: Secondary | ICD-10-CM | POA: Diagnosis not present

## 2015-08-25 DIAGNOSIS — L89313 Pressure ulcer of right buttock, stage 3: Secondary | ICD-10-CM | POA: Insufficient documentation

## 2015-08-25 DIAGNOSIS — L89613 Pressure ulcer of right heel, stage 3: Secondary | ICD-10-CM | POA: Diagnosis not present

## 2015-08-25 DIAGNOSIS — E559 Vitamin D deficiency, unspecified: Secondary | ICD-10-CM | POA: Insufficient documentation

## 2015-08-25 DIAGNOSIS — C3412 Malignant neoplasm of upper lobe, left bronchus or lung: Secondary | ICD-10-CM | POA: Insufficient documentation

## 2015-08-25 DIAGNOSIS — I491 Atrial premature depolarization: Secondary | ICD-10-CM | POA: Insufficient documentation

## 2015-08-26 NOTE — Progress Notes (Signed)
[name] [name] ([id number]) Visit Report for 08/25/2015 Allergy List Details Patient Name: [name] [name] L. Date of Service: 08/25/2015 12:45 PM Medical Record Number: [id number] Patient Account Number: [id number] Date of Birth/Sex: 1945/09/28 (69 y.o. Male) Treating RN: Baruch Gouty, RN, BSN, Velva Harman Primary Care Physician: [name] Other Clinician: Referring Physician: Deneise Lever Treating Physician/Extender: Frann Rider in Treatment: 0 Allergies Active Allergies no known allergies Allergy Notes Electronic Signature(s) Signed: 08/25/2015 3:36:04 PM By: Regan Lemming BSN, RN Entered By: Regan Lemming on 08/25/2015 13:23:31 Dallaire, Geneva L. ([id number]) -------------------------------------------------------------------------------- Arrival Information Details Patient Name: [name] [name] L. Date of Service: 08/25/2015 12:45 PM Medical Record Number: [id number] Patient Account Number: [id number] Date of Birth/Sex: [birth year] (69 y.o. Male) Treating RN: Baruch Gouty, RN, BSN, Velva Harman Primary Care Physician: [name] Other Clinician: Referring Physician: Deneise Lever Treating Physician/Extender: Frann Rider in Treatment: 0 Visit Information Patient Arrived: Wheel Chair Arrival Time: 12:56 Accompanied By: son Transfer Assistance: EasyPivot Patient Lift Patient Identification Verified: Yes Secondary Verification Process Yes Completed: Patient Requires Transmission- No Based Precautions: Patient Has Alerts: No Electronic Signature(s) Signed: 08/25/2015 3:36:04 PM By: Regan Lemming BSN, RN Entered By: Regan Lemming on 08/25/2015 13:02:39 Wimberly, Camron L. ([id number]) -------------------------------------------------------------------------------- Clinic Level of Care Assessment Details Patient Name: [name] [name] L. Date of Service: 08/25/2015 12:45 PM Medical Record Number: [id number] Patient Account Number: [id number] Date of Birth/Sex:  June 04, 1946 (69 y.o. Male) Treating RN: Baruch Gouty, RN, BSN, Velva Harman Primary Care Physician: [name] Other Clinician: Referring Physician: Deneise Lever Treating Physician/Extender: Frann Rider in Treatment: 0 Clinic Level of Care Assessment Items TOOL 2 Quantity Score '[]'$  - Use when only an EandM is performed on the INITIAL visit 0 ASSESSMENTS - Nursing Assessment / Reassessment X - General Physical Exam (combine w/ comprehensive assessment (listed just 1 20 below) when performed on new pt. evals) X - Comprehensive Assessment (HX, ROS, Risk Assessments, Wounds Hx, etc.) 1 25 ASSESSMENTS - Wound and Skin Assessment / Reassessment '[]'$  - Simple Wound Assessment / Reassessment - one wound 0 X - Complex Wound Assessment / Reassessment - multiple wounds 3 5 '[]'$  - Dermatologic / Skin Assessment (not related to wound area) 0 ASSESSMENTS - Ostomy and/or Continence Assessment and Care '[]'$  - Incontinence Assessment and Management 0 '[]'$  - Ostomy Care Assessment and Management (repouching, etc.) 0 PROCESS - Coordination of Care X - Simple Patient / Family Education for ongoing care 1 15 '[]'$  - Complex (extensive) Patient / Family Education for ongoing care 0 '[]'$  - Staff obtains Programmer, systems, Records, Test Results / Process Orders 0 '[]'$  - Staff telephones HHA, Nursing Homes / Clarify orders / etc 0 '[]'$  - Routine Transfer to another Facility (non-emergent condition) 0 '[]'$  - Routine Hospital Admission (non-emergent condition) 0 '[]'$  - New Admissions / Biomedical engineer / Ordering NPWT, Apligraf, etc. 0 '[]'$  - Emergency Hospital Admission (emergent condition) 0 X - Simple Discharge Coordination 1 10 [name] [name] L. ([id number]) '[]'$  - Complex (extensive) Discharge Coordination 0 PROCESS - Special Needs '[]'$  - Pediatric / Minor Patient Management 0 '[]'$  - Isolation Patient Management 0 '[]'$  - Hearing / Language / Visual special needs 0 '[]'$  - Assessment of Community assistance (transportation, D/C  planning, etc.) 0 '[]'$  - Additional assistance / Altered mentation 0 '[]'$  - Support Surface(s) Assessment (bed, cushion, seat, etc.) 0 INTERVENTIONS - Wound Cleansing / Measurement X - Wound Imaging (photographs - any number of wounds) 1 5 '[]'$  - Wound Tracing (instead of photographs) 0 '[]'$  - Simple Wound Measurement - one wound 0 X -  Complex Wound Measurement - multiple wounds 3 5 '[]'$  - Simple Wound Cleansing - one wound 0 X - Complex Wound Cleansing - multiple wounds 3 5 INTERVENTIONS - Wound Dressings X - Small Wound Dressing one or multiple wounds 3 10 '[]'$  - Medium Wound Dressing one or multiple wounds 0 '[]'$  - Large Wound Dressing one or multiple wounds 0 '[]'$  - Application of Medications - injection 0 INTERVENTIONS - Miscellaneous '[]'$  - External ear exam 0 '[]'$  - Specimen Collection (cultures, biopsies, blood, body fluids, etc.) 0 '[]'$  - Specimen(s) / Culture(s) sent or taken to Lab for analysis 0 '[]'$  - Patient Transfer (multiple staff / Civil Service fast streamer / Similar devices) 0 '[]'$  - Simple Staple / Suture removal (25 or less) 0 '[]'$  - Complex Staple / Suture removal (26 or more) 0 [name] [name] L. ([id number]) '[]'$  - Hypo / Hyperglycemic Management (close monitor of Blood Glucose) 0 X - Ankle / Brachial Index (ABI) - do not check if billed separately 1 15 Has the patient been seen at the hospital within the last three years: Yes Total Score: 165 Level Of Care: New/Established - Level 5 Electronic Signature(s) Signed: 08/25/2015 3:36:04 PM By: Regan Lemming BSN, RN Entered By: Regan Lemming on 08/25/2015 14:03:13 [place], [place]. ([id number]) -------------------------------------------------------------------------------- Encounter Discharge Information Details Patient Name: [name] [name] L. Date of Service: 08/25/2015 12:45 PM Medical Record Number: [id number] Patient Account Number: [id number] Date of Birth/Sex: 06-08-1946 (69 y.o. Male) Treating RN: Baruch Gouty, RN, BSN, Velva Harman Primary Care Physician:  [name] Other Clinician: Referring Physician: Deneise Lever Treating Physician/Extender: Frann Rider in Treatment: 0 Encounter Discharge Information Items Discharge Pain Level: 1 Discharge Condition: Stable Ambulatory Status: Wheelchair Discharge Destination: Home Transportation: Private Auto Accompanied By: son Schedule Follow-up Appointment: No Medication Reconciliation completed and provided to Patient/Care No Frida Wahlstrom: Provided on Clinical Summary of Care: 08/25/2015 Form Type Recipient Paper Patient [place] Electronic Signature(s) Signed: 08/25/2015 3:36:04 PM By: Regan Lemming BSN, RN Previous Signature: 08/25/2015 1:57:32 PM Version By: Ruthine Dose Entered By: Regan Lemming on 08/25/2015 14:05:24 [name] [name] L. ([id number]) -------------------------------------------------------------------------------- Lower Extremity Assessment Details Patient Name: [name] [name] L. Date of Service: 08/25/2015 12:45 PM Medical Record Number: [id number] Patient Account Number: [id number] Date of Birth/Sex: 1946/05/21 (68 y.o. Male) Treating RN: Baruch Gouty, RN, BSN, Velva Harman Primary Care Physician: [name] Other Clinician: Referring Physician: Deneise Lever Treating Physician/Extender: Frann Rider in Treatment: 0 Vascular Assessment Claudication: Claudication Assessment [Left:None] [Right:None] Pulses: Posterior Tibial Palpable: [Left:No] [Right:No] Doppler: [Left:Monophasic] [Right:Inaudible] Dorsalis Pedis Palpable: [Left:No] [Right:No] Doppler: [Left:Monophasic] [Right:Monophasic] Extremity colors, hair growth, and conditions: Extremity Color: [Left:Pale] [Right:Pale] Hair Growth on Extremity: [Left:No] [Right:No] Temperature of Extremity: [Left:Cool] [Right:Cool] Capillary Refill: [Left:< 3 seconds] [Right:< 3 seconds] Dependent Rubor: [Left:No] [Right:No] Blanched when Elevated: [Left:No] [Right:No] Lipodermatosclerosis: [Left:No]  [Right:No] Blood Pressure: Brachial: [Left:112] [Right:113] Dorsalis Pedis: 50 [Left:Dorsalis Pedis: 55] Ankle: Posterior Tibial: 35 [Left:Posterior Tibial: 0.51] [Right:0.49] Toe Nail Assessment Left: Right: Thick: Yes Yes Discolored: Yes Yes Deformed: No No Improper Length and Hygiene: No No Electronic Signature(s) Signed: 08/25/2015 3:36:04 PM By: Regan Lemming BSN, RN Entered By: Regan Lemming on 08/25/2015 13:55:03 U[place], [place]. ([id number]) -------------------------------------------------------------------------------- Multi Wound Chart Details Patient Name: [name] [name] L. Date of Service: 08/25/2015 12:45 PM Medical Record Number: [id number] Patient Account Number: [id number] Date of Birth/Sex: 08-Jun-1946 (69 y.o. Male) Treating RN: Baruch Gouty, RN, BSN, Velva Harman Primary Care Physician: [name] Other Clinician: Referring Physician: Deneise Lever Treating Physician/Extender: Frann Rider in Treatment: 0 Vital Signs Height(in): 71 Pulse(bpm): 100 Weight(lbs): Blood  Pressure 112/76 (mmHg): Body Mass Index(BMI): Temperature(F): 97.5 Respiratory Rate 17 (breaths/min): Photos: [1:No Photos] [2:No Photos] [3:No Photos] Wound Location: [1:Sacrum] [2:Right Calcaneous] [3:Left Malleolus - Medial] Wounding Event: [1:Pressure Injury] [2:Pressure Injury] [3:Pressure Injury] Primary Etiology: [1:Pressure Ulcer] [2: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] Comorbid History: [1:Angina, Arrhythmia, Congestive Heart Failure, Coronary Artery Disease, Hypertension, Myocardial Infarction, Type II Diabetes, History of pressure wounds, Osteoarthritis, Dementia, Received Radiation] [2:Angina, Arrhythmia, Congestive  Heart Failure, Coronary Artery Disease, Hypertension, Myocardial Infarction, Type II Diabetes, History of pressure wounds, Osteoarthritis, Dementia, Received Radiation] [3:Angina, Arrhythmia, Congestive Heart Failure, Coronary Artery  Disease,  Hypertension, Myocardial Infarction, Type II Diabetes, History of pressure wounds, Osteoarthritis, Dementia, Received Radiation] Date Acquired: [1:05/22/2015] [2:05/22/2015] [3:05/22/2015] Weeks of Treatment: [1:0] [2:0] [3:0] Wound Status: [1:Open] [2:Open] [3:Open] Measurements L x W x D 7x8x0.1 [2:5x5x0.1] [3:3x3x0.1] (cm) Area (cm) : [1:43.982] [2:19.635] [3:7.069] Volume (cm) : [1:4.398] [2:1.963] [3:0.707] Classification: [1:Category/Stage III] [2:Category/Stage III] [3:Grade 2] HBO Classification: [1:N/A] [2:Grade 1] [3:N/A] Exudate Amount: [1:Small] [2:Small] [3:Small] Exudate Type: [1:Serosanguineous] [2:Serosanguineous] [3:Serosanguineous] Exudate Color: [1:red, brown] [2:red, brown] [3:red, brown] Wound Margin: [1:Distinct, outline attached] [2:Distinct, outline attached] [3:Distinct, outline attached] Granulation Amount: [1:Medium (34-66%)] [2:Medium (34-66%)] [3:Small (1-33%)] Granulation Quality: [1:Pink, Pale] [2:Pink, Pale] [3:N/A] Necrotic Amount: [1:Medium (34-66%)] [2:Medium (34-66%)] [3:Large (67-100%)] Exposed Structures: Fascia: No Fascia: No Fascia: No Fat: No Fat: No Fat: No Tendon: No Tendon: No Tendon: No Muscle: No Muscle: No Muscle: No Joint: No Joint: No Joint: No Bone: No Bone: No Bone: No Limited to Skin Limited to Skin Limited to Skin Breakdown Breakdown Breakdown Epithelialization: None None Medium (34-66%) Periwound Skin Texture: Edema: No Edema: No Edema: No Excoriation: No Excoriation: No Excoriation: No Induration: No Induration: No Induration: No Callus: No Callus: No Callus: No Crepitus: No Crepitus: No Crepitus: No Fluctuance: No Fluctuance: No Fluctuance: No Friable: No Friable: No Friable: No Rash: No Rash: No Rash: No Scarring: No Scarring: No Scarring: No Periwound Skin Moist: Yes Moist: Yes Moist: Yes Moisture: Maceration: No Maceration: No Maceration: No Dry/Scaly: No Dry/Scaly:  No Dry/Scaly: No Periwound Skin Color: Atrophie Blanche: No Atrophie Blanche: No Atrophie Blanche: No Cyanosis: No Cyanosis: No Cyanosis: No Ecchymosis: No Ecchymosis: No Ecchymosis: No Erythema: No Erythema: No Erythema: No Hemosiderin Staining: No Hemosiderin Staining: No Hemosiderin Staining: No Mottled: No Mottled: No Mottled: No Pallor: No Pallor: No Pallor: No Rubor: No Rubor: No Rubor: No Temperature: No Abnormality No Abnormality No Abnormality Tenderness on No No No Palpation: Wound Preparation: Ulcer Cleansing: Ulcer Cleansing: Ulcer Cleansing: Rinsed/Irrigated with Rinsed/Irrigated with Rinsed/Irrigated with Saline Saline Saline Topical Anesthetic Topical Anesthetic Topical Anesthetic Applied: Other: lidocaine Applied: Other: lidocaine Applied: Other: lidocaine 4% 4% 4% Treatment Notes Electronic Signature(s) Signed: 08/25/2015 3:36:04 PM By: Regan Lemming BSN, RN Entered By: Regan Lemming on 08/25/2015 14:02:25 Pettinato, Delaney Carlean Jews ([id number]) -------------------------------------------------------------------------------- [place] Details Patient Name: [name] [name] L. Date of Service: 08/25/2015 12:45 PM Medical Record Number: [id number] Patient Account Number: [id number] Date of Birth/Sex: October 31, 1945 (69 y.o. Male) Treating RN: Baruch Gouty, RN, BSN, Velva Harman Primary Care Physician: [name] Other Clinician: Referring Physician: Deneise Lever Treating Physician/Extender: Frann Rider in Treatment: 0 Active Inactive Orientation to the Wound Care Program Nursing Diagnoses: Knowledge deficit related to the wound healing center program Goals: Patient/caregiver will verbalize understanding of the [place] Program Date Initiated: 08/25/2015 Goal Status: Active Interventions: Provide education on orientation to the wound center Notes: Pressure Nursing Diagnoses: Knowledge deficit related to causes and risk  factors for  pressure ulcer development Knowledge deficit related to management of pressures ulcers Potential for impaired tissue integrity related to pressure, friction, moisture, and shear Goals: Patient will remain free from development of additional pressure ulcers Date Initiated: 08/25/2015 Goal Status: Active Patient will remain free of pressure ulcers Date Initiated: 08/25/2015 Goal Status: Active Patient/caregiver will verbalize risk factors for pressure ulcer development Date Initiated: 08/25/2015 Goal Status: Active Patient/caregiver will verbalize understanding of pressure ulcer management Date Initiated: 08/25/2015 Goal Status: Active Interventions: Finnan, Vahan L. ([id number]) Assess: immobility, friction, shearing, incontinence upon admission and as needed Assess offloading mechanisms upon admission and as needed Assess potential for pressure ulcer upon admission and as needed Provide education on pressure ulcers Treatment Activities: Patient referred for pressure reduction/relief devices : 08/25/2015 Pressure reduction/relief device ordered : 08/25/2015 Notes: Wound/Skin Impairment Nursing Diagnoses: Impaired tissue integrity Knowledge deficit related to ulceration/compromised skin integrity Goals: Patient/caregiver will verbalize understanding of skin care regimen Date Initiated: 08/25/2015 Goal Status: Active Ulcer/skin breakdown will have a volume reduction of 30% by week 4 Date Initiated: 08/25/2015 Goal Status: Active Ulcer/skin breakdown will have a volume reduction of 50% by week 8 Date Initiated: 08/25/2015 Goal Status: Active Ulcer/skin breakdown will have a volume reduction of 80% by week 12 Date Initiated: 08/25/2015 Goal Status: Active Ulcer/skin breakdown will heal within 14 weeks Date Initiated: 08/25/2015 Goal Status: Active Interventions: Assess patient/caregiver ability to perform ulcer/skin care regimen upon admission and as needed Assess  ulceration(s) every visit Provide education on ulcer and skin care Notes: Electronic Signature(s) Signed: 08/25/2015 3:36:04 PM By: Regan Lemming BSN, RN Entered By: Regan Lemming on 08/25/2015 14:01:57 Trammel, [place]. ([id number]) Kirley, [place] ([id number]) -------------------------------------------------------------------------------- Pain Assessment Details Patient Name: Kiner, Chen L. Date of Service: 08/25/2015 12:45 PM Medical Record Number: [id number] Patient Account Number: [id number] Date of Birth/Sex: 26-Nov-1945 (69 y.o. Male) Treating RN: Baruch Gouty, RN, BSN, Velva Harman Primary Care Physician: [name] Other Clinician: Referring Physician: Deneise Lever Treating Physician/Extender: Frann Rider in Treatment: 0 Active Problems Location of Pain Severity and Description of Pain Patient Has Paino Patient Unable to Respond Site Locations Pain Management and Medication Current Pain Management: Electronic Signature(s) Signed: 08/25/2015 3:36:04 PM By: Regan Lemming BSN, RN Entered By: Regan Lemming on 08/25/2015 13:03:25 Cessna, Goku Carlean Jews ([id number]) -------------------------------------------------------------------------------- Patient/Caregiver Education Details Patient Name: [name] [name] L. Date of Service: 08/25/2015 12:45 PM Medical Record Number: [id number] Patient Account Number: [id number] Date of Birth/Gender: [birth year] (69 y.o. Male) Treating RN: Baruch Gouty, RN, BSN, Velva Harman Primary Care Physician: [name] Other Clinician: Referring Physician: Deneise Lever Treating Physician/Extender: Frann Rider in Treatment: 0 Education Assessment Education Provided To: Patient and Caregiver son Education Topics Provided Basic Hygiene: Methods: Explain/Verbal Responses: State content correctly Pressure: Methods: Explain/Verbal Responses: State content correctly Welcome To [facility] [place]: Methods: Explain/Verbal Responses:  State content correctly Wound/Skin Impairment: Methods: Explain/Verbal Responses: State content correctly Electronic Signature(s) Signed: 08/25/2015 3:36:04 PM By: Regan Lemming BSN, RN Entered By: Regan Lemming on 08/25/2015 14:05:50 Beam, [place] ([id number]) -------------------------------------------------------------------------------- Wound Assessment Details Patient Name: [name] [name] L. Date of Service: 08/25/2015 12:45 PM Medical Record Number: [id number] Patient Account Number: [id number] Date of Birth/Sex: 02/22/1946 (69 y.o. Male) Treating RN: Baruch Gouty, RN, BSN, Velva Harman Primary Care Physician: [name] Other Clinician: Referring Physician: Deneise Lever Treating Physician/Extender: Frann Rider in Treatment: 0 Wound Status Wound Number: 1 Primary Pressure Ulcer Etiology: Wound Location: Sacrum Wound Open Wounding Event: Pressure Injury Status: Date Acquired: 05/22/2015 Comorbid Angina, Arrhythmia, Congestive Heart Weeks Of Treatment: 0  History: Failure, Coronary Artery Disease, Clustered Wound: No Hypertension, Myocardial Infarction, Type II Diabetes, History of pressure wounds, Osteoarthritis, Dementia, Received Radiation Photos Photo Uploaded By: Regan Lemming on 08/25/2015 15:02:35 Wound Measurements Length: (cm) 7 % Reduction in Width: (cm) 8 % Reduction in Depth: (cm) 0.1 Epithelializat Area: (cm) 43.982 Tunneling: Volume: (cm) 4.398 Undermining: Area: Volume: ion: None No No Wound Description Classification: Category/Stage III Foul Odor Aft Wound Margin: Distinct, outline attached Exudate Amount: Small Exudate Type: Serosanguineous Exudate Color: red, brown er Cleansing: No Wound Bed Granulation Amount: Medium (34-66%) Exposed Structure Cocker, Jaise L. ([id number]) Granulation Quality: Pink, Pale Fascia Exposed: No Necrotic Amount: Medium (34-66%) Fat Layer Exposed: No Necrotic Quality: Adherent Slough Tendon  Exposed: No Muscle Exposed: No Joint Exposed: No Bone Exposed: No Limited to Skin Breakdown Periwound Skin Texture Texture Color No Abnormalities Noted: No No Abnormalities Noted: No Callus: No Atrophie Blanche: No Crepitus: No Cyanosis: No Excoriation: No Ecchymosis: No Fluctuance: No Erythema: No Friable: No Hemosiderin Staining: No Induration: No Mottled: No Localized Edema: No Pallor: No Rash: No Rubor: No Scarring: No Temperature / Pain Moisture Temperature: No Abnormality No Abnormalities Noted: No Dry / Scaly: No Maceration: No Moist: Yes Wound Preparation Ulcer Cleansing: Rinsed/Irrigated with Saline Topical Anesthetic Applied: Other: lidocaine 4%, Treatment Notes Wound #1 (Sacrum) 1. Cleansed with: Clean wound with Normal Saline 3. Peri-wound Care: Skin Prep 4. Dressing Applied: Aquacel Ag 5. Secondary Dressing Applied Bordered Foam Dressing 6. Footwear/Offloading device applied Other footwear/offloading device applied (specify in notes) Notes sage boots Electronic Signature(s) FAARIS, ARIZPE ([id number]) Signed: 08/25/2015 3:36:04 PM By: Regan Lemming BSN, RN Entered By: Regan Lemming on 08/25/2015 13:30:50 Ciaravino, Jaryan L. ([id number]) -------------------------------------------------------------------------------- Wound Assessment Details Patient Name: Palazzo, [name] [name] L. Date of Service: 08/25/2015 12:45 PM Medical Record Number: [id number] Patient Account Number: [id number] Date of Birth/Sex: [birth year] (69 y.o. Male) Treating RN: Baruch Gouty, RN, BSN, [place] Primary Care Physician: [name] Other Clinician: Referring Physician: Deneise Lever Treating Physician/Extender: Frann Rider in Treatment: 0 Wound Status Wound Number: 2 Primary Pressure Ulcer Etiology: Wound Location: Right Calcaneous Wound Open Wounding Event: Pressure Injury Status: Date Acquired: 05/22/2015 Comorbid Angina, Arrhythmia, Congestive Heart Weeks  Of Treatment: 0 History: Failure, Coronary Artery Disease, Clustered Wound: No Hypertension, Myocardial Infarction, Type II Diabetes, History of pressure wounds, Osteoarthritis, Dementia, Received Radiation Photos Photo Uploaded By: Regan Lemming on 08/25/2015 15:02:49 Wound Measurements Length: (cm) 5 % Reduction in Width: (cm) 5 % Reduction in Depth: (cm) 0.1 Epithelializat Area: (cm) 19.635 Tunneling: Volume: (cm) 1.963 Undermining: Area: Volume: ion: None No No Wound Description Classification: Category/Stage III Foul Odor Aft Korte, Genesis L. ([id number]) er Cleansing: No Diabetic Severity Earleen Newport): Grade 1 Wound Margin: Distinct, outline attached Exudate Amount: Small Exudate Type: Serosanguineous Exudate Color: red, brown Wound Bed Granulation Amount: Medium (34-66%) Exposed Structure Granulation Quality: Pink, Pale Fascia Exposed: No Necrotic Amount: Medium (34-66%) Fat Layer Exposed: No Necrotic Quality: Adherent Slough Tendon Exposed: No Muscle Exposed: No Joint Exposed: No Bone Exposed: No Limited to Skin Breakdown Periwound Skin Texture Texture Color No Abnormalities Noted: No No Abnormalities Noted: No Callus: No Atrophie Blanche: No Crepitus: No Cyanosis: No Excoriation: No Ecchymosis: No Fluctuance: No Erythema: No Friable: No Hemosiderin Staining: No Induration: No Mottled: No Localized Edema: No Pallor: No Rash: No Rubor: No Scarring: No Temperature / Pain Moisture Temperature: No Abnormality No Abnormalities Noted: No Dry / Scaly: No Maceration: No Moist: Yes Wound Preparation Ulcer Cleansing: Rinsed/Irrigated with Saline Topical Anesthetic Applied: Other: lidocaine 4%,  Treatment Notes Wound #2 (Right Calcaneous) 1. Cleansed with: Clean wound with Normal Saline 3. Peri-wound Care: Skin Prep 4. Dressing Applied: Aquacel Ag Lovin, [place] ([id number]) 5. Secondary Dressing Applied Bordered Foam Dressing 6.  Footwear/Offloading device applied Other footwear/offloading device applied (specify in notes) Notes sage boots Electronic Signature(s) Signed: 08/25/2015 3:36:04 PM By: Regan Lemming BSN, RN Entered By: Regan Lemming on 08/25/2015 13:32:48 Churilla, Simpson L. ([id number]) -------------------------------------------------------------------------------- Wound Assessment Details Patient Name: Karpf, Basil L. Date of Service: 08/25/2015 12:45 PM Medical Record Number: [id number] Patient Account Number: [id number] Date of Birth/Sex: 06/24/1946 (68 y.o. Male) Treating RN: Baruch Gouty, RN, BSN, [place] Primary Care Physician: [name] Other Clinician: Referring Physician: Deneise Lever Treating Physician/Extender: Frann Rider in Treatment: 0 Wound Status Wound Number: 3 Primary Diabetic Wound/Ulcer of the Lower Etiology: Extremity Wound Location: Left Malleolus - Medial Wound Open Wounding Event: Pressure Injury Status: Date Acquired: 05/22/2015 Comorbid Angina, Arrhythmia, Congestive Heart Weeks Of Treatment: 0 History: Failure, Coronary Artery Disease, Clustered Wound: No Hypertension, Myocardial Infarction, Type II Diabetes, History of pressure wounds, Osteoarthritis, Dementia, Received Radiation Photos Photo Uploaded By: Regan Lemming on 08/25/2015 15:03:00 Wound Measurements Length: (cm) 3 Width: (cm) 3 Depth: (cm) 0.1 Area: (cm) 7.069 Volume: (cm) 0.707 % Reduction in Area: % Reduction in Volume: Epithelialization: Medium (34-66%) Tunneling: No Undermining: No Wound Description Classification: Grade 2 Foul Odor Aft Tanimoto, Santosh L. ([id number]) er Cleansing: No Wound Margin: Distinct, outline attached Exudate Amount: Small Exudate Type: Serosanguineous Exudate Color: red, brown Wound Bed Granulation Amount: Small (1-33%) Exposed Structure Necrotic Amount: Large (67-100%) Fascia Exposed: No Necrotic Quality: Adherent Slough Fat Layer Exposed:  No Tendon Exposed: No Muscle Exposed: No Joint Exposed: No Bone Exposed: No Limited to Skin Breakdown Periwound Skin Texture Texture Color No Abnormalities Noted: No No Abnormalities Noted: No Callus: No Atrophie Blanche: No Crepitus: No Cyanosis: No Excoriation: No Ecchymosis: No Fluctuance: No Erythema: No Friable: No Hemosiderin Staining: No Induration: No Mottled: No Localized Edema: No Pallor: No Rash: No Rubor: No Scarring: No Temperature / Pain Moisture Temperature: No Abnormality No Abnormalities Noted: No Dry / Scaly: No Maceration: No Moist: Yes Wound Preparation Ulcer Cleansing: Rinsed/Irrigated with Saline Topical Anesthetic Applied: Other: lidocaine 4%, Treatment Notes Wound #3 (Left, Medial Malleolus) 1. Cleansed with: Clean wound with Normal Saline 4. Dressing Applied: Santyl Ointment 5. Secondary Dressing Applied Gauze and Kerlix/Conform Electronic Signature(s) RAYLEE, ADAMEC ([id number]) Signed: 08/25/2015 3:36:04 PM By: Regan Lemming BSN, RN Entered By: Regan Lemming on 08/25/2015 13:34:04 Tapp, Jermiah L. ([id number]) -------------------------------------------------------------------------------- Vitals Details Patient Name: Marinaccio, Donta L. Date of Service: 08/25/2015 12:45 PM Medical Record Number: [id number] Patient Account Number: [id number] Date of Birth/Sex: 19-Dec-1945 (69 y.o. Male) Treating RN: Baruch Gouty, RN, BSN, Velva Harman Primary Care Physician: [name] Other Clinician: Referring Physician: Deneise Lever Treating Physician/Extender: Frann Rider in Treatment: 0 Vital Signs Time Taken: 13:09 Temperature (F): 97.5 Height (in): 71 Pulse (bpm): 100 Source: Stated Respiratory Rate (breaths/min): 17 Blood Pressure (mmHg): 112/76 Reference Range: 80 - 120 mg / dl Electronic Signature(s) Signed: 08/25/2015 3:36:04 PM By: Regan Lemming BSN, RN Entered By: Regan Lemming on 08/25/2015 13:04:05

## 2015-08-26 NOTE — Progress Notes (Signed)
Marcus [name], Marcus [name] ([id number]) Visit Report for 08/25/2015 Abuse/Suicide Risk Screen Details Mundo, Cricket 08/25/2015 12:45 Patient Name: Date of Service: L. PM Medical Record Patient Account Number: [id number] Number: Treating RN: Afful, RN, BSN, Velva Harman Date of Birth/Sex: [birth year] (69 y.o. Male) Other Clinician: Primary Care Physician: [name] Treating Britto, Errol Referring Physician: Deneise Lever Physician/Extender: Suella Grove in Treatment: 0 Abuse/Suicide Risk Screen Items Answer ABUSE/SUICIDE RISK SCREEN: Has anyone close to you tried to hurt or harm you recentlyo No Do you feel uncomfortable with anyone in your familyo No Has anyone forced you do things that you didnot want to doo No Do you have any thoughts of harming yourselfo No Patient displays signs or symptoms of abuse and/or neglect. No Electronic Signature(s) Signed: 08/25/2015 3:36:04 PM By: Regan Lemming BSN, RN Entered By: Regan Lemming on 08/25/2015 13:16:11 Benoist, Audrie Lia ([id number]) -------------------------------------------------------------------------------- Activities of Daily Living Details [place], [employer] 08/25/2015 12:45 Patient Name: Date of Service: L. PM Medical Record Patient Account Number: [id number] Number: Treating RN: Afful, RN, BSN, Velva Harman Date of Birth/Sex: Oct 15, 1945 (69 y.o. Male) Other Clinician: Primary Care Physician: [name] Treating Britto, Errol Referring Physician: Deneise Lever Physician/Extender: Suella Grove in Treatment: 0 Activities of Daily Living Items Answer Activities of Daily Living (Please select one for each item) Drive Automobile Not Able Take Medications Need Assistance Use Telephone Need Assistance Care for Appearance Need Assistance Use Toilet Need Assistance Bath / Shower Need Assistance Dress Self Need Assistance Feed Self Need Assistance Walk Not Able Get In / Out Bed Need Assistance Housework Need  Assistance Prepare Meals Not Able Handle Money Not Able Shop for Self Need Assistance Electronic Signature(s) Signed: 08/25/2015 3:36:04 PM By: Regan Lemming BSN, RN Entered By: Regan Lemming on 08/25/2015 13:21:32 [place], [place] ([id number]) -------------------------------------------------------------------------------- Education Assessment Details Mastrangelo, Kimberly 08/25/2015 12:45 Patient Name: Date of Service: L. PM Medical Record Patient Account Number: [id number] Number: Treating RN: Afful, RN, BSN, Velva Harman Date of Birth/Sex: Jun 20, 1946 (69 y.o. Male) Other Clinician: Primary Care Physician: [name] Treating Britto, Errol Referring Physician: Deneise Lever Physician/Extender: Suella Grove in Treatment: 0 Primary Learner Assessed: Caregiver son Learning Preferences/Education Level/Primary Language Learning Preference: Explanation Highest Education Level: High School Preferred Language: English Cognitive Barrier Assessment/Beliefs Language Barrier: No Physical Barrier Assessment Impaired Vision: Yes Glasses Impaired Hearing: No Decreased Hand dexterity: No Knowledge/Comprehension Assessment Knowledge Level: Low Comprehension Level: Low Ability to understand written Low instructions: Ability to understand verbal Low instructions: Motivation Assessment Anxiety Level: Calm Cooperation: Cooperative Interest in Health Problems: Uninterested Perception: Coherent Willingness to Engage in Self- Low Management Activities: Readiness to Engage in Self- Low Management Activities: Electronic Signature(s) Signed: 08/25/2015 3:36:04 PM By: Regan Lemming BSN, RN Entered By: Regan Lemming on 08/25/2015 13:22:04 Cadavid, Joevanni Carlean Jews ([id number]) -------------------------------------------------------------------------------- Fall Risk Assessment Details [facility], [place] 08/25/2015 12:45 Patient Name: Date of Service: L. PM Medical Record Patient Account  Number: [id number] Number: Treating RN: Afful, RN, BSN, Velva Harman Date of Birth/Sex: [birth year] (69 y.o. Male) Other Clinician: Primary Care Physician: [name] Treating Britto, Errol Referring Physician: Deneise Lever Physician/Extender: Suella Grove in Treatment: 0 Fall Risk Assessment Items Have you had 2 or more falls in the last 12 monthso 0 Yes Have you had any fall that resulted in injury in the last 12 monthso 0 Yes FALL RISK ASSESSMENT: History of falling - immediate or within 3 months 25 Yes Secondary diagnosis 0 No Ambulatory aid None/bed rest/wheelchair/nurse 0 Yes Crutches/cane/walker 0 No Furniture 0 No IV Access/Saline Lock 0 No Gait/Training Normal/bed rest/immobile  0 Yes Weak 10 Yes Impaired 20 Yes Mental Status Oriented to own ability 0 No Electronic Signature(s) Signed: 08/25/2015 3:36:04 PM By: Regan Lemming BSN, RN Entered By: Regan Lemming on 08/25/2015 13:22:25 Brigante, Barnaby L. ([id number]) -------------------------------------------------------------------------------- Foot Assessment Details [place], [place] 08/25/2015 12:45 Patient Name: Date of Service: L. PM Medical Record Patient Account Number: [id number] Number: Treating RN: Afful, RN, BSN, Velva Harman Date of Birth/Sex: [birth year] (69 y.o. Male) Other Clinician: Primary Care Physician: [name] Treating Britto, Errol Referring Physician: Deneise Lever Physician/Extender: Suella Grove in Treatment: 0 Foot Assessment Items Site Locations + = Sensation present, - = Sensation absent, C = Callus, U = Ulcer R = Redness, W = Warmth, M = Maceration, PU = Pre-ulcerative lesion F = Fissure, S = Swelling, D = Dryness Assessment Right: Left: Other Deformity: No No Prior Foot Ulcer: No No Prior Amputation: No No Charcot Joint: No No Ambulatory Status: Non-ambulatory Assistance Device: Wheelchair Gait: Administrator, arts) Signed: 08/25/2015 3:36:04 PM By: Regan Lemming BSN, RN Entered By: Regan Lemming on 08/25/2015 13:22:54 Musolf, Kaushik L. ([id number]) [place], [place] ([id number]) -------------------------------------------------------------------------------- Nutrition Risk Assessment Details [place], [place] 08/25/2015 12:45 Patient Name: Date of Service: L. PM Medical Record Patient Account Number: [id number] Number: Treating RN: Baruch Gouty, RN, BSN, Velva Harman Date of Birth/Sex: Jan 11, 1946 (69 y.o. Male) Other Clinician: Primary Care Physician: [name] Treating Britto, Errol Referring Physician: Deneise Lever Physician/Extender: Suella Grove in Treatment: 0 Height (in): 71 Weight (lbs): Body Mass Index (BMI): Nutrition Risk Assessment Items NUTRITION RISK SCREEN: I have an illness or condition that made me change the kind and/or 0 No amount of food I eat I eat fewer than two meals per day 0 No I eat few fruits and vegetables, or milk products 0 No I have three or more drinks of beer, liquor or wine almost every day 0 No I have tooth or mouth problems that make it hard for me to eat 0 No I don't always have enough money to buy the food I need 0 No I eat alone most of the time 0 No I take three or more different prescribed or over-the-counter drugs a 0 No day Without wanting to, I have lost or gained 10 pounds in the last six 2 Yes months I am not always physically able to shop, cook and/or feed myself 2 Yes Nutrition Protocols Good Risk Protocol Provide education on Moderate Risk Protocol 0 nutrition Electronic Signature(s) Signed: 08/25/2015 3:36:04 PM By: Regan Lemming BSN, RN Entered By: Regan Lemming on 08/25/2015 13:22:39

## 2015-08-26 NOTE — Progress Notes (Signed)
Marcus [name], [place]ORE ([id number]) Visit Report for 08/25/2015 Chief Complaint Document Details [place], [place] 08/25/2015 12:45 Patient Name: Date of Service: L. PM Medical Record Patient Account Number: [id number] Number: Treating RN: Afful, RN, BSN, Velva Harman Date of Birth/Sex: 1946-08-03 (69 y.o. Male) Other Clinician: Primary Care Physician: [name] Treating Mikya Don Referring Physician: Deneise Lever Physician/Extender: Suella Grove in Treatment: 0 Information Obtained from: Patient Chief Complaint Patient is at the clinic for treatment of an open pressure ulcer to the sacrum to the left ankle and right heel for about 3 months Electronic Signature(s) Signed: 08/25/2015 1:35:26 PM By: Christin Fudge MD, FACS Entered By: Christin Fudge on 08/25/2015 13:35:26 Marcus [name], [name] ([id number]) -------------------------------------------------------------------------------- HPI Details Marcus [name], Marcus [name] 08/25/2015 12:45 Patient Name: Date of Service: L. PM Medical Record Patient Account Number: [id number] Number: Treating RN: Afful, RN, BSN, Velva Harman Date of Birth/Sex: 05/22/1946 (69 y.o. Male) Other Clinician: Primary Care Physician: [name] Treating Joanny Dupree Referring Physician: Deneise Lever Physician/Extender: Suella Grove in Treatment: 0 History of Present Illness Location: sacral, left ankle and right heel Quality: Patient reports experiencing a dull pain to affected area(s). Severity: Patient states wound are getting worse. Duration: Patient has had the wound for > 3 months prior to seeking treatment at the wound center Context: The wound appeared gradually over time Modifying Factors: Other treatment(s) tried include: local care and offloading Associated Signs and Symptoms: Patient reports having difficulty standing and is wheelchair-bound and recumbent for most of the day. HPI Description: 69 year old gentleman diagnosed with  inoperable lung cancer and has received 5 treatments of radiation and was not treated for any chemotherapy. He has been bedbound and moves around with a wheelchair occasionally. His nutrition is inadequate and take some protein supplements. He is a full DO NOT RESUSCITATE. Electronic Signature(s) Signed: 08/25/2015 1:37:06 PM By: Christin Fudge MD, FACS Entered By: Christin Fudge on 08/25/2015 13:37:06 Marcus [name], Marcus [name] ([id number]) -------------------------------------------------------------------------------- Physical Exam Details [place], [place] 08/25/2015 12:45 Patient Name: Date of Service: L. PM Medical Record Patient Account Number: [id number] Number: Treating RN: Baruch Gouty, RN, BSN, Velva Harman Date of Birth/Sex: [birth year] (69 y.o. Male) Other Clinician: Primary Care Physician: [name] Treating Tamarah Bhullar Referring Physician: Deneise Lever Physician/Extender: Suella Grove in Treatment: 0 Constitutional . Pulse regular. Respirations normal and unlabored. Afebrile. . Eyes Nonicteric. Reactive to light. Ears, Nose, Mouth, and Throat Lips, teeth, and gums WNL.Marland Kitchen Moist mucosa without lesions . Neck supple and nontender. No palpable supraclavicular or cervical adenopathy. Normal sized without goiter. Respiratory WNL. No retractions.. Cardiovascular Pedal Pulses WNL. ABI is 0.51 on the left and 0.49 on the right.. No clubbing, cyanosis or edema. Gastrointestinal (GI) Abdomen without masses or tenderness.. No liver or spleen enlargement or tenderness.. Lymphatic No adneopathy. No adenopathy. No adenopathy. Musculoskeletal Adexa without tenderness or enlargement.. Digits and nails w/o clubbing, cyanosis, infection, petechiae, ischemia, or inflammatory conditions.. Integumentary (Hair, Skin) No suspicious lesions. No crepitus or fluctuance. No peri-wound warmth or erythema. No masses.Marland Kitchen Psychiatric Judgement and insight Intact.. No evidence of depression, anxiety,  or agitation.. Notes noted to have stage III pressure injury to the sacrum right gluteal and mainly the left gluteal area. There is no debridement to be done. Right heel has a stage III pressure injury and no debridement is required. Left ankle on the lateral malleolus he has a stage III pressure injury. Electronic Signature(s) Signed: 08/25/2015 2:09:38 PM By: Christin Fudge MD, FACS Previous Signature: 08/25/2015 2:08:37 PM Version By: Christin Fudge MD, FACS Previous Signature: 08/25/2015 1:38:42 PM Version By: Con Memos  Demira Gwynne MD, FACS Entered By: Christin Fudge on 08/25/2015 14:09:38 Marcus [name], Marcus [name] ([id number]) Marcus [name], Marcus [name] ([id number]) -------------------------------------------------------------------------------- Physician Orders Details [place], [place] 08/25/2015 12:45 Patient Name: Date of Service: L. PM Medical Record Patient Account Number: [id number] Number: Treating RN: Afful, RN, BSN, Velva Harman Date of Birth/Sex: [birth year] (69 y.o. Male) Other Clinician: Primary Care Physician: [name] Treating Rayvin Abid Referring Physician: Deneise Lever Physician/Extender: Suella Grove in Treatment: 0 Verbal / Phone Orders: Yes Clinician: Afful, RN, BSN, Rita Read Back and Verified: Yes Diagnosis Coding ICD-10 Coding Code Description C34.12 Malignant neoplasm of upper lobe, left bronchus or lung L89.153 Pressure ulcer of sacral region, stage 3 L89.313 Pressure ulcer of right buttock, stage 3 L89.323 Pressure ulcer of left buttock, stage 3 L89.613 Pressure ulcer of right heel, stage 3 L89.523 Pressure ulcer of left ankle, stage 3 E44.0 Moderate protein-calorie malnutrition Wound Cleansing Wound #1 Sacrum o Clean wound with Normal Saline. o Cleanse wound with mild soap and water o May Shower, gently pat wound dry prior to applying new dressing. o May shower with protection. Wound #2 Right Calcaneous o Clean wound with Normal Saline. o  Cleanse wound with mild soap and water o May Shower, gently pat wound dry prior to applying new dressing. o May shower with protection. Wound #3 Left,Medial Malleolus o Clean wound with Normal Saline. o Cleanse wound with mild soap and water o May Shower, gently pat wound dry prior to applying new dressing. o May shower with protection. Skin Barriers/Peri-Wound Care Wound #1 Sacrum o Skin Prep Narula, Marcellis L. ([id number]) Wound #2 Right Calcaneous o Skin Prep Wound #3 Left,Medial Malleolus o Skin Prep Primary Wound Dressing Wound #1 Sacrum o Aquacel Ag Wound #2 Right Calcaneous o Aquacel Ag Wound #3 Left,Medial Malleolus o Santyl Ointment Secondary Dressing Wound #1 Sacrum o Boardered Foam Dressing Wound #2 Right Calcaneous o Boardered Foam Dressing Wound #3 Left,Medial Malleolus o Gauze and Kerlix/Conform Dressing Change Frequency Wound #1 Sacrum o Change dressing every other day. Wound #2 Right Calcaneous o Change dressing every other day. Wound #3 Left,Medial Malleolus o Change dressing every day. Follow-up Appointments Wound #1 Sacrum o Return Appointment in 1 week. Wound #2 Right Calcaneous o Return Appointment in 1 week. Wound #3 Left,Medial Malleolus o Return Appointment in 1 week. Sonneborn, Kasra L. ([id number]) Off-Loading Wound #1 Sacrum o Heel suspension boot to: - SAGE BOOTS o Gel wheelchair cushion o Turn and reposition every 2 hours Wound #2 Right Calcaneous o Heel suspension boot to: - SAGE BOOTS o Gel wheelchair cushion o Turn and reposition every 2 hours Wound #3 Left,Medial Malleolus o Heel suspension boot to: - SAGE BOOTS o Gel wheelchair cushion o Turn and reposition every 2 hours Additional Orders / Instructions Wound #1 Sacrum o Increase protein intake. o Other: - Vitamin C , Zinc Wound #2 Right Calcaneous o Increase protein intake. o Other: - Vitamin C ,  Zinc Wound #3 Left,Medial Malleolus o Increase protein intake. o Other: - Vitamin C , Zinc Electronic Signature(s) Signed: 08/25/2015 1:59:00 PM By: Regan Lemming BSN, RN Signed: 08/25/2015 4:13:31 PM By: Christin Fudge MD, FACS Previous Signature: 08/25/2015 1:57:22 PM Version By: Regan Lemming BSN, RN Entered By: Regan Lemming on 08/25/2015 13:59:00 Marcus [name], [name] ([id number]) -------------------------------------------------------------------------------- Problem List Details [place], [place] 08/25/2015 12:45 Patient Name: Date of Service: L. PM Medical Record Patient Account Number: [id number] Number: Treating RN: Baruch Gouty, RN, BSN, Velva Harman Date of Birth/Sex: 1946-05-17 (69 y.o. Male) Other Clinician: Primary Care Physician: [name] Treating  Christin Fudge Referring Physician: Deneise Lever Physician/Extender: Suella Grove in Treatment: 0 Active Problems ICD-10 Encounter Code Description Active Date Diagnosis C34.12 Malignant neoplasm of upper lobe, left bronchus or lung 08/25/2015 Yes L89.153 Pressure ulcer of sacral region, stage 3 08/25/2015 Yes L89.313 Pressure ulcer of right buttock, stage 3 08/25/2015 Yes L89.323 Pressure ulcer of left buttock, stage 3 08/25/2015 Yes L89.613 Pressure ulcer of right heel, stage 3 08/25/2015 Yes L89.523 Pressure ulcer of left ankle, stage 3 08/25/2015 Yes E44.0 Moderate protein-calorie malnutrition 08/25/2015 Yes Inactive Problems Resolved Problems Electronic Signature(s) Signed: 08/25/2015 1:34:53 PM By: Christin Fudge MD, FACS Entered By: Christin Fudge on 08/25/2015 13:34:53 Elvin, Ladarrell L. ([id number]) -------------------------------------------------------------------------------- Progress Note Details [place], [place] 08/25/2015 12:45 Patient Name: Date of Service: L. PM Medical Record Patient Account Number: [id number] Number: Treating RN: Afful, RN, BSN, Velva Harman Date of Birth/Sex: 19-Apr-1946 (69 y.o.  Male) Other Clinician: Primary Care Physician: [name] Treating Laconya Clere Referring Physician: Deneise Lever Physician/Extender: Suella Grove in Treatment: 0 Subjective Chief Complaint Information obtained from Patient Patient is at the clinic for treatment of an open pressure ulcer to the sacrum to the left ankle and right heel for about 3 months History of Present Illness (HPI) The following HPI elements were documented for the patient's wound: Location: sacral, left ankle and right heel Quality: Patient reports experiencing a dull pain to affected area(s). Severity: Patient states wound are getting worse. Duration: Patient has had the wound for > 3 months prior to seeking treatment at the wound center Context: The wound appeared gradually over time Modifying Factors: Other treatment(s) tried include: local care and offloading Associated Signs and Symptoms: Patient reports having difficulty standing and is wheelchair-bound and recumbent for most of the day. 69 year old gentleman diagnosed with inoperable lung cancer and has received 5 treatments of radiation and was not treated for any chemotherapy. He has been bedbound and moves around with a wheelchair occasionally. His nutrition is inadequate and take some protein supplements. He is a full DO NOT RESUSCITATE. Wound History Patient presents with 3 open wounds that have been present for approximately 5month. Patient has been treating wounds in the following manner: alleyvene. Laboratory tests have not been performed in the last month. Patient reportedly has not tested positive for an antibiotic resistant organism. Patient reportedly has not tested positive for osteomyelitis. Patient reportedly has not had testing performed to evaluate circulation in the legs. Patient experiences the following problems associated with their wounds: infection. Patient History Information obtained from Patient, Caregiver, Chart, KDeneise Lever FErin Allergies no known allergies Schwalm, [name] ([id number] Family History Diabetes - Mother, Father, Hypertension - Mother, Father, No family history of Cancer, Heart Disease, Hereditary Spherocytosis, Kidney Disease, Lung Disease, Seizures, Stroke, Thyroid Problems, Tuberculosis. Social History Former smoker, Marital Status - Married, Alcohol Use - Never, Drug Use - No History. Medical History Ear/Nose/Mouth/Throat Denies history of Chronic sinus problems/congestion, Middle ear problems Hematologic/Lymphatic Denies history of Anemia, Hemophilia, Human Immunodeficiency Virus, Lymphedema, Sickle Cell Disease Respiratory Denies history of Aspiration, Asthma, Chronic Obstructive Pulmonary Disease (COPD), Pneumothorax, Sleep Apnea, Tuberculosis Cardiovascular Patient has history of Angina, Arrhythmia, Congestive Heart Failure, Coronary Artery Disease, Hypertension, Myocardial Infarction Gastrointestinal Denies history of Cirrhosis , Colitis, Crohn s, Hepatitis A, Hepatitis B, Hepatitis C Endocrine Patient has history of Type II Diabetes Genitourinary Denies history of End Stage Renal Disease Immunological Denies history of Lupus Erythematosus, Scleroderma Integumentary (Skin) Patient has history of History of pressure wounds Musculoskeletal Patient has history of Osteoarthritis Neurologic Patient has history  of Dementia - vascular Oncologic Patient has history of Received Radiation Psychiatric Denies history of Anorexia/bulimia, Confinement Anxiety Patient is treated with Controlled Diet. Medical And Surgical History Notes Respiratory cough, lung canceer Cardiovascular premature atrial contraction Genitourinary cystitis Immunological vitamin D deficiency Musculoskeletal Marcus [name], [name] 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 Eyes Complains or has symptoms of Glasses / Contacts. Ear/Nose/Mouth/Throat The patient has no complaints or symptoms. Hematologic/Lymphatic The patient has no complaints or symptoms. Respiratory The patient has no complaints or symptoms. Cardiovascular The patient has no complaints or symptoms. Gastrointestinal The patient has no complaints or symptoms. Endocrine The patient has no complaints or symptoms. Immunological The patient has no complaints or symptoms. Integumentary (Skin) Complains or has symptoms of Wounds, Breakdown. Musculoskeletal The patient has no complaints or symptoms, gait disturbance Neurologic The patient has no complaints or symptoms. Oncologic The patient has no complaints or symptoms. Psychiatric The patient has no complaints or symptoms. Medications aspirin 81 mg tablet,delayed release oral 1 1 tablet,delayed release (DR/EC) oral Tylenol 325 mg tablet oral 2 2 tablet oral Depakote ER 250 mg tablet,extended release oral tablet extended release 24 hr oral Depakote ER 500 mg tablet,extended release oral 2 2 tablet extended release 24 hr oral gabapentin 600 mg tablet oral 1 1 tablet oral Imodium A-D 2 mg tablet oral tablet oral Lipitor 40 mg tablet oral 1 1 tablet oral Zyprexa 20 mg tablet oral tablet oral albuterol sulfate 2.5 mg/3 mL (0.083 %) solution for nebulization inhalation solution for nebulization inhalation Pacifico, Cedric L. ([id number]) Ensure Enlive 0.08 gram-1.5 kcal/mL oral liquid oral liquid oral ferrous sulfate 325 mg (65 mg iron) tablet oral 1 1 tablet oral Dulcolax (bisacodyl) 5 mg tablet,delayed release oral 1 1 tablet,delayed release Reguloid 0.52 gram capsule oral 1 1 capsule oral levothyroxine 75 mcg tablet oral 1 1 tablet oral ergocalciferol (vitamin D2) 50,000 unit capsule oral 1 1 capsule oral Objective Constitutional Pulse regular. Respirations normal  and unlabored. Afebrile. Vitals Time Taken: 1:09 PM, Height: 71 in, Source: Stated, Temperature: 97.5 F, Pulse: 100 bpm, Respiratory Rate: 17 breaths/min, Blood Pressure: 112/76 mmHg. Eyes Nonicteric. Reactive to light. Ears, Nose, Mouth, and Throat Lips, teeth, and gums WNL.Marland Kitchen Moist mucosa without lesions . Neck supple and nontender. No palpable supraclavicular or cervical adenopathy. Normal sized without goiter. Respiratory WNL. No retractions.. Cardiovascular Pedal Pulses WNL. ABI is 0.51 on the left and 0.49 on the right.. No clubbing, cyanosis or edema. Gastrointestinal (GI) Abdomen without masses or tenderness.. No liver or spleen enlargement or tenderness.. Lymphatic No adneopathy. No adenopathy. No adenopathy. Musculoskeletal Adexa without tenderness or enlargement.. Digits and nails w/o clubbing, cyanosis, infection, petechiae, ischemia, or inflammatory conditions.Marland Kitchen Psychiatric Judgement and insight Intact.. No evidence of depression, anxiety, or agitation.. General Notes: noted to have stage III pressure injury to the sacrum right gluteal and mainly the left gluteal Marcus [name], [name] ([id number]) area. There is no debridement to be done. Right heel has a stage III pressure injury and no debridement is required. Left ankle on the lateral malleolus he has a stage III pressure injury. Integumentary (Hair, Skin) No suspicious lesions. No crepitus or fluctuance. No peri-wound warmth or erythema. No masses.. Wound #1 status is Open. Original cause of wound was Pressure Injury. The wound is located on the Sacrum. The wound measures 7cm length x 8cm width x 0.1cm depth; 43.982cm^2 area and 4.398cm^3  volume. The wound is limited to skin breakdown. There is no tunneling or undermining noted. There is a small amount of serosanguineous drainage noted. The wound margin is distinct with the outline attached to the wound base. There is medium (34-66%) pink, pale granulation within the  wound bed. There is a medium (34-66%) amount of necrotic tissue within the wound bed including Adherent Slough. The periwound skin appearance exhibited: Moist. The periwound skin appearance did not exhibit: Callus, Crepitus, Excoriation, Fluctuance, Friable, Induration, Localized Edema, Rash, Scarring, Dry/Scaly, Maceration, Atrophie Blanche, Cyanosis, Ecchymosis, Hemosiderin Staining, Mottled, Pallor, Rubor, Erythema. Periwound temperature was noted as No Abnormality. Wound #2 status is Open. Original cause of wound was Pressure Injury. The wound is located on the Right Calcaneous. The wound measures 5cm length x 5cm width x 0.1cm depth; 19.635cm^2 area and 1.963cm^3 volume. The wound is limited to skin breakdown. There is no tunneling or undermining noted. There is a small amount of serosanguineous drainage noted. The wound margin is distinct with the outline attached to the wound base. There is medium (34-66%) pink, pale granulation within the wound bed. There is a medium (34-66%) amount of necrotic tissue within the wound bed including Adherent Slough. The periwound skin appearance exhibited: Moist. The periwound skin appearance did not exhibit: Callus, Crepitus, Excoriation, Fluctuance, Friable, Induration, Localized Edema, Rash, Scarring, Dry/Scaly, Maceration, Atrophie Blanche, Cyanosis, Ecchymosis, Hemosiderin Staining, Mottled, Pallor, Rubor, Erythema. Periwound temperature was noted as No Abnormality. Wound #3 status is Open. Original cause of wound was Pressure Injury. The wound is located on the Left,Medial Malleolus. The wound measures 3cm length x 3cm width x 0.1cm depth; 7.069cm^2 area and 0.707cm^3 volume. The wound is limited to skin breakdown. There is no tunneling or undermining noted. There is a small amount of serosanguineous drainage noted. The wound margin is distinct with the outline attached to the wound base. There is small (1-33%) granulation within the wound bed. There  is a large (67- 100%) amount of necrotic tissue within the wound bed including Adherent Slough. The periwound skin appearance exhibited: Moist. The periwound skin appearance did not exhibit: Callus, Crepitus, Excoriation, Fluctuance, Friable, Induration, Localized Edema, Rash, Scarring, Dry/Scaly, Maceration, Atrophie Blanche, Cyanosis, Ecchymosis, Hemosiderin Staining, Mottled, Pallor, Rubor, Erythema. Periwound temperature was noted as No Abnormality. Assessment Active Problems ICD-10 C34.12 - Malignant neoplasm of upper lobe, left bronchus or lung L89.153 - Pressure ulcer of sacral region, stage 3 L89.313 - Pressure ulcer of right buttock, stage 3 Marcus [name], [name] ([id number]) I29.798 - Pressure ulcer of left buttock, stage 3 L89.613 - Pressure ulcer of right heel, stage 3 L89.523 - Pressure ulcer of left ankle, stage 3 E44.0 - Moderate protein-calorie malnutrition This moribund 69 year old with advanced incurable lung cancer is severely malnourished and debilitated. He is here for palliative therapy and though his ABIs are very suggestive of peripheral vascular disease he has family does not want any further intervention. I have recommended: 1. Silver alginate and appropriate form offloading to be applied to his sacral region and his right heel. 2. Santyl ointment to be applied to his left heel 3. Nutrition supplements with protein and multivitamins including vitamin C and zinc 4. Offloading measures have been discussed in great detail 5. Low air loss mattress and other offloading devices including a Roho cushion for his wheelchair. 6. Symptomatic treatment has also been discussed. The patient's son and his nurse practitioner are at the bedside and all questions have been answered. He will see as back next week. Plan Wound Cleansing: Wound #  1 Sacrum: Clean wound with Normal Saline. Cleanse wound with mild soap and water May Shower, gently pat wound dry prior to applying new  dressing. May shower with protection. Wound #2 Right Calcaneous: Clean wound with Normal Saline. Cleanse wound with mild soap and water May Shower, gently pat wound dry prior to applying new dressing. May shower with protection. Wound #3 Left,Medial Malleolus: Clean wound with Normal Saline. Cleanse wound with mild soap and water May Shower, gently pat wound dry prior to applying new dressing. May shower with protection. Skin Barriers/Peri-Wound Care: Wound #1 Sacrum: Skin Prep Wound #2 Right Calcaneous: Skin Prep Wound #3 Left,Medial Malleolus: Marcus [name], [name] ([id number]) Skin Prep Primary Wound Dressing: Wound #1 Sacrum: Aquacel Ag Wound #2 Right Calcaneous: Aquacel Ag Wound #3 Left,Medial Malleolus: Santyl Ointment Secondary Dressing: Wound #1 Sacrum: Boardered Foam Dressing Wound #2 Right Calcaneous: Boardered Foam Dressing Wound #3 Left,Medial Malleolus: Gauze and Kerlix/Conform Dressing Change Frequency: Wound #1 Sacrum: Change dressing every other day. Wound #2 Right Calcaneous: Change dressing every other day. Wound #3 Left,Medial Malleolus: Change dressing every day. Follow-up Appointments: Wound #1 Sacrum: Return Appointment in 1 week. Wound #2 Right Calcaneous: Return Appointment in 1 week. Wound #3 Left,Medial Malleolus: Return Appointment in 1 week. Off-Loading: Wound #1 Sacrum: Heel suspension boot to: - SAGE BOOTS Gel wheelchair cushion Turn and reposition every 2 hours Wound #2 Right Calcaneous: Heel suspension boot to: - SAGE BOOTS Gel wheelchair cushion Turn and reposition every 2 hours Wound #3 Left,Medial Malleolus: Heel suspension boot to: - SAGE BOOTS Gel wheelchair cushion Turn and reposition every 2 hours Additional Orders / Instructions: Wound #1 Sacrum: Increase protein intake. Other: - Vitamin C , Zinc Wound #2 Right Calcaneous: Increase protein intake. Other: - Vitamin C , Zinc Wound #3 Left,Medial  Malleolus: Increase protein intake. Marcus [name], [name] ([id number]) Other: - Vitamin C , Zinc This moribund 69 year old with advanced incurable lung cancer is severely malnourished and debilitated. He is here for palliative therapy and though his ABIs are very suggestive of peripheral vascular disease he has family does not want any further intervention. I have recommended: 1. Silver alginate and appropriate form offloading to be applied to his sacral region and his right heel. 2. Santyl ointment to be applied to his left heel 3. Nutrition supplements with protein and multivitamins including vitamin C and zinc 4. Offloading measures have been discussed in great detail 5. Low air loss mattress and other offloading devices including a Roho cushion for his wheelchair. 6. Symptomatic treatment has also been discussed. The patient's son and his nurse practitioner are at the bedside and all questions have been answered. He will see as back next week. Electronic Signature(s) Signed: 08/25/2015 2:12:53 PM By: Christin Fudge MD, FACS Entered By: Christin Fudge on 08/25/2015 14:12:53 Julia, Marcus [name] ([id number]) -------------------------------------------------------------------------------- ROS/PFSH Details Amory, Robbin 08/25/2015 12:45 Patient Name: Date of Service: L. PM Medical Record Patient Account Number: [id number] Number: Treating RN: Afful, RN, BSN, Velva Harman Date of Birth/Sex: Oct 30, 1945 (69 y.o. Male) Other Clinician: Primary Care Physician: [name] Treating Candia Kingsbury Referring Physician: Deneise Lever Physician/Extender: Suella Grove in Treatment: 0 Information Obtained From Patient Caregiver Chart Other: Deneise Lever, FNP Wound History Do you currently have one or more open woundso Yes How many open wounds do you currently haveo 3 Approximately how long have you had your woundso 81month How have you been treating your wound(s) until nowo  alleyvene Has your wound(s) ever healed and then re-openedo No Have you had any lab  work done in the past montho No Have you tested positive for an antibiotic resistant organism (MRSA, VRE)o No Have you tested positive for osteomyelitis (bone infection)o No Have you had any tests for circulation on your legso No Have you had other problems associated with your woundso Infection Eyes Complaints and Symptoms: Positive for: Glasses / Contacts Integumentary (Skin) Complaints and Symptoms: Positive for: Wounds; Breakdown Medical History: Positive for: History of pressure wounds Constitutional Symptoms (General Health) Complaints and Symptoms: No Complaints or Symptoms Ear/Nose/Mouth/Throat Complaints and Symptoms: No Complaints or Symptoms Medical History: Negative for: Chronic sinus problems/congestion; Middle ear problems Limbert, Maddax L. ([id number]) Hematologic/Lymphatic Complaints and Symptoms: No Complaints or Symptoms Medical History: Negative for: Anemia; Hemophilia; Human Immunodeficiency Virus; Lymphedema; Sickle Cell Disease Respiratory Complaints and Symptoms: No Complaints or Symptoms Medical History: Negative for: Aspiration; Asthma; Chronic Obstructive Pulmonary Disease (COPD); Pneumothorax; Sleep Apnea; Tuberculosis Past Medical History Notes: cough, lung canceer Cardiovascular Complaints and Symptoms: No Complaints or Symptoms Medical History: Positive for: Angina; Arrhythmia; Congestive Heart Failure; Coronary Artery Disease; Hypertension; Myocardial Infarction Past Medical History Notes: premature atrial contraction Gastrointestinal Complaints and Symptoms: No Complaints or Symptoms Medical History: Negative for: Cirrhosis ; Colitis; Crohnos; Hepatitis A; Hepatitis B; Hepatitis C Endocrine Complaints and Symptoms: No Complaints or Symptoms Medical History: Positive for: Type II Diabetes Time with diabetes: 10 years Treated with:  Diet Genitourinary Varnell, Jahmad L. ([id number]) Medical History: Negative for: End Stage Renal Disease Past Medical History Notes: cystitis Immunological Complaints and Symptoms: No Complaints or Symptoms Medical History: Negative for: Lupus Erythematosus; Scleroderma Past Medical History Notes: vitamin D deficiency Musculoskeletal Complaints and Symptoms: No Complaints or Symptoms Complaints and Symptoms: Review of System Notes: gait disturbance Medical History: Positive for: Osteoarthritis Past Medical History Notes: degenerative disc disease, hemiplegia Neurologic Complaints and Symptoms: No Complaints or Symptoms Medical History: Positive for: Dementia - vascular Oncologic Complaints and Symptoms: No Complaints or Symptoms Medical History: Positive for: Received Radiation Past Medical History Notes: carcinoma of lung Psychiatric Complaints and Symptoms: No Complaints or Symptoms Nieto, Demarrion L. ([id number]) Medical History: Negative for: Anorexia/bulimia; Confinement Anxiety Past Medical History Notes: bipolar affect disorder Family and Social History Cancer: No; Diabetes: Yes - Mother, Father; Heart Disease: No; Hereditary Spherocytosis: No; Hypertension: Yes - Mother, Father; Kidney Disease: No; Lung Disease: No; Seizures: No; Stroke: No; Thyroid Problems: No; Tuberculosis: No; Former smoker; Marital Status - Married; Alcohol Use: Never; Drug Use: No History; Financial Concerns: No; Food, Clothing or Shelter Needs: No; Support System Lacking: No; Advanced Directives: Yes; Do not resuscitate: Yes; Living Will: No Physician Affirmation I have reviewed and agree with the above information. Electronic Signature(s) Signed: 08/25/2015 3:36:04 PM By: Regan Lemming BSN, RN Signed: 08/25/2015 4:13:31 PM By: Christin Fudge MD, FACS Previous Signature: 08/25/2015 1:16:12 PM Version By: Christin Fudge MD, FACS Entered By: Regan Lemming on 08/25/2015  13:29:08 Tourigny, Emitt Carlean [name] ([id number]) -------------------------------------------------------------------------------- SuperBill Details Gouge, Jahari Date of Service: 08/25/2015 Patient Name: L. Patient Account Number: [id number] Medical Record Afful, RN, BSN, [id number] Treating RN: Number: Velva Harman Date of Birth/Sex: 07/05/1946 (69 y.o. 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153 Pressure ulcer of sacral region, stage 3 L89.313 Pressure ulcer of right buttock, stage 3 L89.323 Pressure ulcer of  left buttock, stage 3 Modifier: s or lung Quantity: 1 Electronic Signature(s) Signed: 08/25/2015 2:13:07 PM By: Christin Fudge MD, FACS Entered By: Christin Fudge on 08/25/2015 14:13:07

## 2015-09-12 ENCOUNTER — Ambulatory Visit: Payer: Medicare (Managed Care) | Admitting: General Surgery

## 2015-09-22 ENCOUNTER — Encounter: Payer: Medicare (Managed Care) | Attending: Surgery | Admitting: Surgery

## 2015-09-22 DIAGNOSIS — L89523 Pressure ulcer of left ankle, stage 3: Secondary | ICD-10-CM | POA: Diagnosis not present

## 2015-09-22 DIAGNOSIS — L89323 Pressure ulcer of left buttock, stage 3: Secondary | ICD-10-CM | POA: Diagnosis not present

## 2015-09-22 DIAGNOSIS — C3412 Malignant neoplasm of upper lobe, left bronchus or lung: Secondary | ICD-10-CM | POA: Diagnosis not present

## 2015-09-22 DIAGNOSIS — L89313 Pressure ulcer of right buttock, stage 3: Secondary | ICD-10-CM | POA: Insufficient documentation

## 2015-09-22 DIAGNOSIS — L89613 Pressure ulcer of right heel, stage 3: Secondary | ICD-10-CM | POA: Insufficient documentation

## 2015-09-22 DIAGNOSIS — Z7401 Bed confinement status: Secondary | ICD-10-CM | POA: Insufficient documentation

## 2015-09-22 DIAGNOSIS — Z66 Do not resuscitate: Secondary | ICD-10-CM | POA: Insufficient documentation

## 2015-09-22 DIAGNOSIS — L89153 Pressure ulcer of sacral region, stage 3: Secondary | ICD-10-CM | POA: Insufficient documentation

## 2015-09-22 DIAGNOSIS — E440 Moderate protein-calorie malnutrition: Secondary | ICD-10-CM | POA: Diagnosis not present

## 2015-09-23 NOTE — Progress Notes (Addendum)
Marcus [name] ([id number]) Visit Report for 09/22/2015 Chief Complaint Document Details Marcus [name] Date of Service: 09/22/2015 3:00 PM Patient Name: L. Patient Account Number: [id number] Medical Record Marcus [name], [id number] Treating RN: Number: Marcus [name] Date of Birth/Sex: 1945/12/27 (70 y.o. Male) Other Clinician: Primary Care Physician: Marcus [name] Treating Marcus [name] Referring Physician: Dorthea [name] Physician/Extender: Marcus [name] in Treatment: 4 Information Obtained from: Patient Chief Complaint Patient is at the clinic for treatment of an open pressure ulcer to the sacrum to the left ankle and right heel for about 3 months Electronic Signature(s) Signed: 09/22/2015 3:55:28 PM By: Marcus Fudge MD, FACS Entered By: Marcus [name] on 09/22/2015 15:55:28 Marcus [name], [name] ([id number]) -------------------------------------------------------------------------------- HPI Details Marcus [name] Date of Service: 09/22/2015 3:00 PM Patient Name: L. Patient Account Number: [id number] Medical Record Marcus [name], [id number] Treating RN: Number: Marcus [name] Date of Birth/Sex: 1946-01-20 (70 y.o. Male) Other Clinician: Primary Care Physician: Marcus [name] Treating Marcus [name] Referring Physician: Dorthea [name] Physician/Extender: Marcus [name] in Treatment: 4 History of Present Illness Location: sacral, left ankle and right heel Quality: Patient reports experiencing a dull pain to affected area(s). Severity: Patient states wound are getting worse. Duration: Patient has had the wound for > 3 months prior to seeking treatment at the wound center Context: The wound appeared gradually over time Modifying Factors: Other treatment(s) tried include: local care and offloading Associated Signs and Symptoms: Patient reports having difficulty standing and is wheelchair-bound and recumbent for most of the day. HPI Description: 70 year old gentleman diagnosed with  inoperable lung cancer and has received 5 treatments of radiation and was not treated for any chemotherapy. He has Marcus [name] and moves around with a wheelchair occasionally. His nutrition is inadequate and take some protein supplements. He is a full DO NOT RESUSCITATE. 09/22/2015 -- the patient was seen here for 1 month ago for the first time and in the meantime has deteriorated a lot. 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 His nutrition is very poor. Electronic Signature(s) Signed: 09/22/2015 4:00:55 PM By: Marcus Fudge MD, FACS Entered By: Marcus [name] on 09/22/2015 16:00:55 Marcus [name], Marcus [name] ([id number]) -------------------------------------------------------------------------------- Physical Exam Details Marcus [name] Date of Service: 09/22/2015 3:00 PM Patient Name: L. Patient Account Number: [id number] Medical Record Marcus [name], [id number] Treating RN: Number: Marcus [name] Date of Birth/Sex: 01-08-1946 (70 y.o. Male) Other Clinician: Primary Care Physician: Marcus [name] Treating Marcus [name] Referring Physician: Dorthea [name] Physician/Extender: Marcus [name] in Treatment: 4 Constitutional . Pulse regular. Respirations normal and unlabored. Afebrile. . Eyes Nonicteric. Reactive to light. Ears, Nose, Mouth, and Throat Lips, teeth, and gums WNL.Marland Kitchen Moist mucosa without lesions. Neck supple and nontender. No palpable supraclavicular or cervical adenopathy. Normal sized without goiter. Respiratory WNL. No retractions.. Breath sounds WNL, No rubs, rales, rhonchi, or wheeze.. Cardiovascular Heart rhythm and rate regular, no murmur or gallop.. Pedal Pulses WNL. No clubbing, cyanosis or edema. Lymphatic No adneopathy. No adenopathy. No adenopathy. Musculoskeletal Adexa without tenderness or enlargement.. Digits and nails w/o clubbing, cyanosis, infection, petechiae, ischemia, or inflammatory conditions.. Integumentary (Hair,  Skin) No suspicious lesions. No crepitus or fluctuance. No peri-wound warmth or erythema. No masses.Marland Kitchen Psychiatric Judgement and insight Intact.. No evidence of depression, anxiety, or agitation.. Notes the patient is in very poor general condition and has unstageable pressure injuries to the left hip left ankle and the sacral region with stage III pressure injuries elsewhere. The right heel continues to have necrotic debris and is still a stage III pressure ulcer.  Electronic Signature(s) Signed: 09/22/2015 4:01:37 PM By: Marcus Fudge MD, FACS Entered By: Marcus [name] on 09/22/2015 16:01:37 Marcus [name], Marcus [name] ([id number]) -------------------------------------------------------------------------------- Physician Orders Details Marcus [name] Date of Service: 09/22/2015 3:00 PM Patient Name: L. Patient Account Number: [id number] Medical Record Marcus [name], [id number] Treating RN: Number: Marcus [name] Date of Birth/Sex: [birth year] (70 y.o. Male) Other Clinician: Primary Care Physician: Marcus [name] Treating Gavina Dildine Referring Physician: Dorthea [name] Physician/Extender: Marcus [name] in Treatment: 4 Verbal / Phone Orders: Yes Clinician: Afful, RN, [name], Rita Read Back and Verified: Yes Diagnosis Coding ICD-10 Coding Code Description C34.12 Malignant neoplasm of upper lobe, left bronchus or lung L89.153 Pressure ulcer of sacral region, stage 3 L89.313 Pressure ulcer of right buttock, stage 3 L89.323 Pressure ulcer of left buttock, stage 3 L89.613 Pressure ulcer of right heel, stage 3 L89.523 Pressure ulcer of left ankle, stage 3 E44.0 Moderate protein-calorie malnutrition Wound Cleansing Wound #1 Sacrum o Clean wound with Normal Saline. Wound #2 Right Calcaneous o Clean wound with Normal Saline. Wound #3 Left,Medial Malleolus o Clean wound with Normal Saline. Wound #4 Left,Lateral Lower Leg o Clean wound with Normal Saline. Wound #5 Left Ischial Tuberosity o  Clean wound with Normal Saline. Wound #6 Right Gluteus o Clean wound with Normal Saline. Anesthetic Wound #1 Sacrum o Topical Lidocaine 4% cream applied to wound bed prior to debridement Wound #2 Right Calcaneous Hone, Rawley L. ([id number]) o Topical Lidocaine 4% cream applied to wound bed prior to debridement Wound #3 Left,Medial Malleolus o Topical Lidocaine 4% cream applied to wound bed prior to debridement Wound #4 Left,Lateral Lower Leg o Topical Lidocaine 4% cream applied to wound bed prior to debridement Wound #5 Left Ischial Tuberosity o Topical Lidocaine 4% cream applied to wound bed prior to debridement Wound #6 Right Gluteus o Topical Lidocaine 4% cream applied to wound bed prior to debridement Primary Wound Dressing Wound #1 Sacrum o Aquacel Ag - to wet area o Other: - betadine paint to dry eschar areas Wound #2 Right Calcaneous o Santyl Ointment Wound #3 Left,Medial Malleolus o Santyl Ointment Wound #4 Left,Lateral Lower Leg o Other: - Betadine paint Wound #5 Left Ischial Tuberosity o Other: - Betadine paint Wound #6 Right Gluteus o Other: - Betadine paint Secondary Dressing Wound #1 Sacrum o Boardered Foam Dressing Wound #2 Right Calcaneous o Boardered Foam Dressing Wound #3 Left,Medial Malleolus o Boardered Foam Dressing Wound #4 Left,Lateral Lower Leg o Boardered Foam Dressing Wound #5 Left Ischial Tuberosity o Boardered Foam Dressing Marlowe, Reilly L. ([id number]) Wound #6 Right Gluteus o Boardered Foam Dressing Dressing Change Frequency Wound #1 Sacrum o Change dressing every day. Wound #2 Right Calcaneous o Change dressing every day. Wound #3 Left,Medial Malleolus o Change dressing every day. Wound #4 Left,Lateral Lower Leg o Change dressing every day. Wound #5 Left Ischial Tuberosity o Change dressing every day. Wound #6 Right Gluteus o Change dressing every day. Follow-up  Appointments Wound #1 Sacrum o Other: - [facility] will cal to make appointment Wound #2 Right Calcaneous o Other: - [facility] will cal to make appointment Wound #3 Left,Medial Malleolus o Other: - [facility] will cal to make appointment Wound #4 Left,Lateral Lower Leg o Other: - [facility] will cal to make appointment Wound #5 Left Ischial Tuberosity o Other: - [facility] will cal to make appointment Wound #6 Right Gluteus o Other: - [facility] will cal to make appointment Off-Loading Wound #2 Right Calcaneous o Other: - SAGE BOOTS Wound #3 Left,Medial Malleolus o Other: - SAGE BOOTS Eidem, Hriday L. (  [id number]) Wound #4 Left,Lateral Lower Leg o Other: - SAGE BOOTS Wound #5 Left Ischial Tuberosity o Other: - SAGE BOOTS Additional Orders / Instructions Wound #1 Sacrum o Increase protein intake. o Other: - Zinc, MVI, VIT C Wound #2 Right Calcaneous o Increase protein intake. o Other: - Zinc, MVI, VIT C Wound #3 Left,Medial Malleolus o Increase protein intake. o Other: - Zinc, MVI, VIT C Wound #4 Left,Lateral Lower Leg o Increase protein intake. o Other: - Zinc, MVI, VIT C Wound #5 Left Ischial Tuberosity o Increase protein intake. o Other: - Zinc, MVI, VIT C Wound #6 Right Gluteus o Increase protein intake. o Other: - Zinc, MVI, VIT C Electronic Signature(s) Signed: 09/22/2015 4:57:48 PM By: Regan Lemming BSN, RN Signed: 09/22/2015 5:05:49 PM By: Marcus Fudge MD, FACS Entered By: Regan Lemming on 09/22/2015 16:04:26 Marcus [name], Marcus [name] ([id number]) -------------------------------------------------------------------------------- Problem List Details Marcus [name] Date of Service: 09/22/2015 3:00 PM Patient Name: L. Patient Account Number: [id number] Medical Record Marcus [name], [id number] Treating RN: Number: Marcus [name] Date of Birth/Sex: [birth year] (70 y.o. Male) Other Clinician: Primary Care Physician: Marcus [name] Treating Roston Grunewald Referring  Physician: Dorthea [name] Physician/Extender: Marcus [name] in Treatment: 4 Active Problems ICD-10 Encounter Code Description Active Date Diagnosis C34.12 Malignant neoplasm of upper lobe, left bronchus or lung 08/25/2015 Yes L89.153 Pressure ulcer of sacral region, stage 3 08/25/2015 Yes L89.313 Pressure ulcer of right buttock, stage 3 08/25/2015 Yes L89.323 Pressure ulcer of left buttock, stage 3 08/25/2015 Yes L89.613 Pressure ulcer of right heel, stage 3 08/25/2015 Yes L89.523 Pressure ulcer of left ankle, stage 3 08/25/2015 Yes E44.0 Moderate protein-calorie malnutrition 08/25/2015 Yes Inactive Problems Resolved Problems Electronic Signature(s) Signed: 09/22/2015 3:55:22 PM By: Marcus Fudge MD, FACS Entered By: Marcus [name] on 09/22/2015 15:55:22 [place], [place] ([id number]) -------------------------------------------------------------------------------- Progress Note Details Marcus [name], Marcus [name] Date of Service: 09/22/2015 3:00 PM Patient Name: L. Patient Account Number: [id number] Medical Record Marcus [name], [id number] Treating RN: Number: Marcus [name] Date of Birth/Sex: 05/30/1946 (70 y.o. Male) Other Clinician: Primary Care Physician: Marcus [name] Treating Haleema Vanderheyden Referring Physician: Dorthea [name] Physician/Extender: Marcus [name] in Treatment: 4 Subjective Chief Complaint Information obtained from Patient Patient is at the clinic for treatment of an open pressure ulcer to the sacrum to the left ankle and right heel for about 3 months History of Present Illness (HPI) The following HPI elements were documented for the patient's wound: Location: sacral, left ankle and right heel Quality: Patient reports experiencing a dull pain to affected area(s). Severity: Patient states wound are getting worse. Duration: Patient has had the wound for > 3 months prior to seeking treatment at the wound center Context: The wound appeared gradually over time Modifying Factors: Other  treatment(s) tried include: local care and offloading Associated Signs and Symptoms: Patient reports having difficulty standing and is wheelchair-bound and recumbent for most of the day. 70 year old gentleman diagnosed with inoperable lung cancer and has received 5 treatments of radiation and was not treated for any chemotherapy. He has Marcus [name] and moves around with a wheelchair occasionally. His nutrition is inadequate and take some protein supplements. He is a full DO NOT RESUSCITATE. 09/22/2015 -- the patient was seen here for 1 month ago for the first time and in the meantime has deteriorated a lot. 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 His nutrition is very poor. Objective Constitutional Pulse regular. Respirations normal and unlabored. Afebrile. Marcus [name], [name] ([id number]) Vitals Time Taken: 3:15 PM, Height: 71  in, Pulse: 106 bpm, Respiratory Rate: 18 breaths/min, Blood Pressure: 88/64 mmHg. Eyes Nonicteric. Reactive to light. Ears, Nose, Mouth, and Throat Lips, teeth, and gums WNL.Marland Kitchen Moist mucosa without lesions. Neck supple and nontender. No palpable supraclavicular or cervical adenopathy. Normal sized without goiter. Respiratory WNL. No retractions.. Breath sounds WNL, No rubs, rales, rhonchi, or wheeze.. Cardiovascular Heart rhythm and rate regular, no murmur or gallop.. Pedal Pulses WNL. No clubbing, cyanosis or edema. Lymphatic No adneopathy. No adenopathy. No adenopathy. Musculoskeletal Adexa without tenderness or enlargement.. Digits and nails w/o clubbing, cyanosis, infection, petechiae, ischemia, or inflammatory conditions.Marland Kitchen Psychiatric Judgement and insight Intact.. No evidence of depression, anxiety, or agitation.. General Notes: the patient is in very poor general condition and has unstageable pressure injuries to the left hip left ankle and the sacral region with stage III pressure injuries elsewhere. The  right heel continues to have necrotic debris and is still a stage III pressure ulcer. Integumentary (Hair, Skin) No suspicious lesions. No crepitus or fluctuance. No peri-wound warmth or erythema. No masses.. Wound #1 status is Open. Original cause of wound was Pressure Injury. The wound is located on the Sacrum. The wound measures 17cm length x 8cm width x 0.1cm depth; 106.814cm^2 area and 10.681cm^3 volume. The wound is limited to skin breakdown. There is no tunneling or undermining noted. There is a large amount of serosanguineous drainage noted. The wound margin is distinct with the outline attached to the wound base. There is no granulation within the wound bed. There is a large (67-100%) amount of necrotic tissue within the wound bed including Eschar and Adherent Slough. The periwound skin appearance exhibited: Moist. The periwound skin appearance did not exhibit: Callus, Crepitus, Excoriation, Fluctuance, Friable, Induration, Localized Edema, Rash, Scarring, Dry/Scaly, Maceration, Atrophie Blanche, Cyanosis, Ecchymosis, Hemosiderin Staining, Mottled, Pallor, Rubor, Erythema. Periwound temperature was noted as No Abnormality. Wound #2 status is Open. Original cause of wound was Pressure Injury. The wound is located on the Right Calcaneous. The wound measures 1.9cm length x 1cm width x 0.1cm depth; 1.492cm^2 area and 0.149cm^3 volume. The wound is limited to skin breakdown. There is no tunneling or undermining noted. Marcus [name], [name] ([id number]) There is a small amount of serosanguineous drainage noted. The wound margin is distinct with the outline attached to the wound base. There is medium (34-66%) pink, pale granulation within the wound bed. There is a medium (34-66%) amount of necrotic tissue within the wound bed including Adherent Slough. The periwound skin appearance exhibited: Moist. The periwound skin appearance did not exhibit: Callus, Crepitus, Excoriation, Fluctuance,  Friable, Induration, Localized Edema, Rash, Scarring, Dry/Scaly, Maceration, Atrophie Blanche, Cyanosis, Ecchymosis, Hemosiderin Staining, Mottled, Pallor, Rubor, Erythema. Periwound temperature was noted as No Abnormality. Wound #3 status is Open. Original cause of wound was Pressure Injury. The wound is located on the Left,Medial Malleolus. The wound measures 3.5cm length x 2.7cm width x 0.1cm depth; 7.422cm^2 area and 0.742cm^3 volume. The wound is limited to skin breakdown. There is no tunneling or undermining noted. There is a small amount of serosanguineous drainage noted. The wound margin is distinct with the outline attached to the wound base. There is no granulation within the wound bed. There is a large (67- 100%) amount of necrotic tissue within the wound bed including Adherent Slough. The periwound skin appearance exhibited: Localized Edema, Moist. The periwound skin appearance did not exhibit: Callus, Crepitus, Excoriation, Fluctuance, Friable, Induration, Rash, Scarring, Dry/Scaly, Maceration, Atrophie Blanche, Cyanosis, Ecchymosis, Hemosiderin Staining, Mottled, Pallor, Rubor, Erythema. Periwound temperature was noted as  No Abnormality. Wound #4 status is Open. Original cause of wound was Gradually Appeared. The wound is located on the Left,Lateral Lower Leg. The wound measures 10cm length x 2.5cm width x 0.1cm depth; 19.635cm^2 area and 1.963cm^3 volume. The wound is limited to skin breakdown. There is no tunneling or undermining noted. There is a none present amount of drainage noted. The wound margin is distinct with the outline attached to the wound base. There is no granulation within the wound bed. There is a large (67-100%) amount of necrotic tissue within the wound bed including Eschar. The periwound skin appearance did not exhibit: Callus, Crepitus, Excoriation, Fluctuance, Friable, Induration, Localized Edema, Rash, Scarring, Dry/Scaly, Maceration, Moist, Atrophie  Blanche, Cyanosis, Ecchymosis, Hemosiderin Staining, Mottled, Pallor, Rubor, Erythema. Periwound temperature was noted as No Abnormality. Wound #5 status is Open. Original cause of wound was Pressure Injury. The wound is located on the Left Ischial Tuberosity. The wound measures 5cm length x 3.3cm width x 0.1cm depth; 12.959cm^2 area and 1.296cm^3 volume. The wound is limited to skin breakdown. There is no tunneling or undermining noted. There is a none present amount of drainage noted. The wound margin is distinct with the outline attached to the wound base. There is no granulation within the wound bed. There is a large (67-100%) amount of necrotic tissue within the wound bed including Eschar. The periwound skin appearance exhibited: Dry/Scaly. The periwound skin appearance did not exhibit: Callus, Crepitus, Excoriation, Fluctuance, Friable, Induration, Localized Edema, Rash, Scarring, Maceration, Moist, Atrophie Blanche, Cyanosis, Ecchymosis, Hemosiderin Staining, Mottled, Pallor, Rubor, Erythema. Periwound temperature was noted as No Abnormality. Wound #6 status is Open. Original cause of wound was Pressure Injury. The wound is located on the Right Gluteus. The wound measures 2.5cm length x 1.5cm width x 0.5cm depth; 2.945cm^2 area and 1.473cm^3 volume. The wound is limited to skin breakdown. There is no tunneling or undermining noted. There is a none present amount of drainage noted. The wound margin is distinct with the outline attached to the wound base. There is no granulation within the wound bed. There is a large (67-100%) amount of necrotic tissue within the wound bed. The periwound skin appearance exhibited: Dry/Scaly. The periwound skin appearance did not exhibit: Callus, Crepitus, Excoriation, Fluctuance, Friable, Induration, Localized Edema, Rash, Scarring, Maceration, Moist, Atrophie Blanche, Cyanosis, Ecchymosis, Hemosiderin Staining, Mottled, Pallor, Rubor, Erythema. Marcus [name],  [name] ([id number]) Assessment Active Problems ICD-10 C34.12 - Malignant neoplasm of upper lobe, left bronchus or lung L89.153 - Pressure ulcer of sacral region, stage 3 L89.313 - Pressure ulcer of right buttock, stage 3 L89.323 - Pressure ulcer of left buttock, stage 3 L89.613 - Pressure ulcer of right heel, stage 3 L89.523 - Pressure ulcer of left ankle, stage 3 E44.0 - Moderate protein-calorie malnutrition This unfortunate gentleman with advanced lung cancer and a full DO NOT RESUSCITATE now has deteriorated a lot in the last month. His wounds are extensive and at this stage palliative and supportive care is all we can offer via the wound center. I have spoken to Deneise Lever the nurse practitioner at [facility] care discussed the patient's management and highly recommended home hospice to make the patient comfortable. I have also personally spoken to the patient's son and wife outside the room and explained the gravity and the terminal illness and they understand the treatment plan. We are happy to offer supportive care as often as they desire. Plan Wound Cleansing: Wound #1 Sacrum: Clean wound with Normal Saline. Wound #2 Right Calcaneous: Clean wound  with Normal Saline. Wound #3 Left,Medial Malleolus: Clean wound with Normal Saline. Wound #4 Left,Lateral Lower Leg: Clean wound with Normal Saline. Wound #5 Left Ischial Tuberosity: Clean wound with Normal Saline. Wound #6 Right Gluteus: Clean wound with Normal Saline. Anesthetic: Wound #1 Sacrum: Topical Lidocaine 4% cream applied to wound bed prior to debridement Marcus [name], [name] ([id number]) Wound #2 Right Calcaneous: Topical Lidocaine 4% cream applied to wound bed prior to debridement Wound #3 Left,Medial Malleolus: Topical Lidocaine 4% cream applied to wound bed prior to debridement Wound #4 Left,Lateral Lower Leg: Topical Lidocaine 4% cream applied to wound bed prior to debridement Wound #5 Left  Ischial Tuberosity: Topical Lidocaine 4% cream applied to wound bed prior to debridement Wound #6 Right Gluteus: Topical Lidocaine 4% cream applied to wound bed prior to debridement Primary Wound Dressing: Wound #1 Sacrum: Aquacel Ag - to wet area Other: - betadine paint to dry eschar areas Wound #2 Right Calcaneous: Santyl Ointment Wound #3 Left,Medial Malleolus: Santyl Ointment Wound #4 Left,Lateral Lower Leg: Other: - Betadine paint Wound #5 Left Ischial Tuberosity: Other: - Betadine paint Wound #6 Right Gluteus: Other: - Betadine paint Secondary Dressing: Wound #1 Sacrum: Boardered Foam Dressing Wound #2 Right Calcaneous: Boardered Foam Dressing Wound #3 Left,Medial Malleolus: Boardered Foam Dressing Wound #4 Left,Lateral Lower Leg: Boardered Foam Dressing Wound #5 Left Ischial Tuberosity: Boardered Foam Dressing Wound #6 Right Gluteus: Boardered Foam Dressing Dressing Change Frequency: Wound #1 Sacrum: Change dressing every day. Wound #2 Right Calcaneous: Change dressing every day. Wound #3 Left,Medial Malleolus: Change dressing every day. Wound #4 Left,Lateral Lower Leg: Change dressing every day. Wound #5 Left Ischial Tuberosity: Change dressing every day. Wound #6 Right Gluteus: Change dressing every day. Follow-up Appointments: Marcus [name], Marcus [name] ([id number]) Wound #1 Sacrum: Other: - [facility] will cal to make appointment Wound #2 Right Calcaneous: Other: - [facility] will cal to make appointment Wound #3 Left,Medial Malleolus: Other: - [facility] will cal to make appointment Wound #4 Left,Lateral Lower Leg: Other: - [facility] will cal to make appointment Wound #5 Left Ischial Tuberosity: Other: - [facility] will cal to make appointment Wound #6 Right Gluteus: Other: - [facility] will cal to make appointment Off-Loading: Wound #2 Right Calcaneous: Other: - SAGE BOOTS Wound #3 Left,Medial Malleolus: Other: - SAGE BOOTS Wound #4 Left,Lateral Lower Leg: Other: - SAGE  BOOTS Wound #5 Left Ischial Tuberosity: Other: - SAGE BOOTS Additional Orders / Instructions: Wound #1 Sacrum: Increase protein intake. Other: - Zinc, MVI, VIT C Wound #2 Right Calcaneous: Increase protein intake. Other: - Zinc, MVI, VIT C Wound #3 Left,Medial Malleolus: Increase protein intake. Other: - Zinc, MVI, VIT C Wound #4 Left,Lateral Lower Leg: Increase protein intake. Other: - Zinc, MVI, VIT C Wound #5 Left Ischial Tuberosity: Increase protein intake. Other: - Zinc, MVI, VIT C Wound #6 Right Gluteus: Increase protein intake. Other: - Zinc, MVI, VIT C This unfortunate gentleman with advanced lung cancer and a full DO NOT RESUSCITATE now has deteriorated a lot in the last month. His wounds are extensive and at this stage palliative and supportive care is all we can offer via the wound center. I have spoken to Deneise Lever the nurse practitioner at Marcus [name], Marcus [name] ([id number]) [facility] discussed the patient's management and highly recommended home hospice to make the patient comfortable. I have also personally spoken to the patient's son and wife outside the room and explained the gravity and the terminal illness and they understand the treatment plan. We are happy to offer supportive  care as often as they desire. Electronic Signature(s) Signed: 09/22/2015 5:04:54 PM By: Marcus Fudge MD, FACS Previous Signature: 09/22/2015 5:04:44 PM Version By: Marcus Fudge MD, FACS Previous Signature: 09/22/2015 4:03:50 PM Version By: Marcus Fudge MD, FACS Entered By: Marcus [name] on 09/22/2015 17:04:54 Marcus [name], Marcus [name] ([id number]) -------------------------------------------------------------------------------- SuperBill Details Marcus [name], Marcus [name] Date of Service: 09/22/2015 Patient Name: L. Patient Account Number: [id number] Medical Record Marcus [name], [id number] Treating RN: Number: Marcus [name] Date of Birth/Sex: 01/21/1946 (70 y.o. 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, stage 3 L89.313 Pressure ulcer of right buttock, stage 3 L89.323 Pressure ulcer of left buttock, stage 3 L89.613 Pressure ulcer of right heel, stage 3 L89.523 Pressure ulcer of left ankle, stage 3 E44.0 Moderate protein-calorie malnutrition Physician Procedures CPT4 Code: [id number] Description: 99213 - WC PHYS LEVEL 3 - EST PT ICD-10 Description Diagnosis 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

## 2015-09-23 NOTE — Progress Notes (Addendum)
BROGHAN, PANNONE ([id number]) Visit Report for 09/22/2015 Arrival Information Details Patient Name: Marcus [name], [name] Date of Service: 09/22/2015 3:00 PM Medical Record Number: [id number] Patient Account Number: [id number] Date of Birth/Sex: [birth year] (70 y.o. Male) Treating RN: Baruch Gouty, RN, BSN, Velva Harman Primary Care Physician: [name] Other Clinician: Referring Physician: [name] Treating Physician/Extender: Frann Rider in Treatment: 4 Visit Information History Since Last Visit Added or deleted any medications: No Patient Arrived: Wheel Chair Any new allergies or adverse reactions: No Arrival Time: 15:15 Had a fall or experienced change in No activities of daily living that may affect Accompanied By: son risk of falls: Transfer Assistance: Manual Signs or symptoms of abuse/neglect since last No Patient Identification Verified: Yes visito Secondary Verification Process Yes Hospitalized since last visit: No Completed: Has Dressing in Place as Prescribed: Yes Patient Requires Transmission-Based No Pain Present Now: No Precautions: Patient Has Alerts: No Electronic Signature(s) Signed: 09/22/2015 4:57:48 PM By: Regan Lemming BSN, RN Entered By: Regan Lemming on 09/22/2015 15:15:43 Marcus [name], [name] ([id number]) -------------------------------------------------------------------------------- Clinic Level of Care Assessment Details Patient Name: Marcus [name], [name] Date of Service: 09/22/2015 3:00 PM Medical Record Number: [id number] Patient Account Number: [id number] Date of Birth/Sex: Aug 10, 1946 (70 y.o. Male) Treating RN: Baruch Gouty, RN, BSN, [place] Primary Care Physician: [name] Other Clinician: Referring Physician: [name] Treating Physician/Extender: Frann Rider in Treatment: 4 Clinic Level of Care Assessment Items TOOL 4 Quantity Score '[]'$  - Use when only an EandM is performed on FOLLOW-UP visit 0 ASSESSMENTS -  Nursing Assessment / Reassessment X - Reassessment of Co-morbidities (includes updates in patient status) 1 10 X - Reassessment of Adherence to Treatment Plan 1 5 ASSESSMENTS - Wound and Skin Assessment / Reassessment '[]'$  - Simple Wound Assessment / Reassessment - one wound 0 X - Complex Wound Assessment / Reassessment - multiple wounds 6 5 '[]'$  - Dermatologic / Skin Assessment (not related to wound area) 0 ASSESSMENTS - Focused Assessment '[]'$  - Circumferential Edema Measurements - multi extremities 0 '[]'$  - Nutritional Assessment / Counseling / Intervention 0 X - Lower Extremity Assessment (monofilament, tuning fork, pulses) 1 5 '[]'$  - Peripheral Arterial Disease Assessment (using hand held doppler) 0 ASSESSMENTS - Ostomy and/or Continence Assessment and Care '[]'$  - Incontinence Assessment and Management 0 '[]'$  - Ostomy Care Assessment and Management (repouching, etc.) 0 PROCESS - Coordination of Care X - Simple Patient / Family Education for ongoing care 1 15 '[]'$  - Complex (extensive) Patient / Family Education for ongoing care 0 '[]'$  - Staff obtains Programmer, systems, Records, Test Results / Process Orders 0 '[]'$  - Staff telephones HHA, Nursing Homes / Clarify orders / etc 0 '[]'$  - Routine Transfer to another Facility (non-emergent condition) 0 Marcus [name], [name] '[]'$  - Routine Hospital Admission (non-emergent condition) 0 '[]'$  - New Admissions / Biomedical engineer / Ordering NPWT, Apligraf, etc. 0 '[]'$  - Emergency Hospital Admission (emergent condition) 0 '[]'$  - Simple Discharge Coordination 0 '[]'$  - Complex (extensive) Discharge Coordination 0 PROCESS - Special Needs '[]'$  - Pediatric / Minor Patient Management 0 '[]'$  - Isolation Patient Management 0 '[]'$  - Hearing / Language / Visual special needs 0 '[]'$  - Assessment of Community assistance (transportation, D/C planning, etc.) 0 '[]'$  - Additional assistance / Altered mentation 0 '[]'$  - Support Surface(s) Assessment (bed, cushion, seat, etc.) 0 INTERVENTIONS  - Wound Cleansing / Measurement '[]'$  - Simple Wound Cleansing - one wound 0 X - Complex Wound Cleansing - multiple wounds 6 5 X - Wound Imaging (photographs - any number  of wounds) 1 5 '[]'$  - Wound Tracing (instead of photographs) 0 '[]'$  - Simple Wound Measurement - one wound 0 X - Complex Wound Measurement - multiple wounds 6 5 INTERVENTIONS - Wound Dressings '[]'$  - Small Wound Dressing one or multiple wounds 0 '[]'$  - Medium Wound Dressing one or multiple wounds 0 X - Large Wound Dressing one or multiple wounds 6 20 '[]'$  - Application of Medications - topical 0 '[]'$  - Application of Medications - injection 0 INTERVENTIONS - Miscellaneous '[]'$  - External ear exam 0 Marcus [name], [name] ([id number]) '[]'$  - Specimen Collection (cultures, biopsies, blood, body fluids, etc.) 0 '[]'$  - Specimen(s) / Culture(s) sent or taken to Lab for analysis 0 '[]'$  - Patient Transfer (multiple staff / Harrel Lemon Lift / Similar devices) 0 '[]'$  - Simple Staple / Suture removal (25 or less) 0 '[]'$  - Complex Staple / Suture removal (26 or more) 0 '[]'$  - Hypo / Hyperglycemic Management (close monitor of Blood Glucose) 0 '[]'$  - Ankle / Brachial Index (ABI) - do not check if billed separately 0 X - Vital Signs 1 5 Has the patient been seen at the hospital within the last three years: Yes Total Score: 255 Level Of Care: New/Established - Level 5 Electronic Signature(s) Signed: 09/22/2015 4:57:48 PM By: Regan Lemming BSN, RN Entered By: Regan Lemming on 09/22/2015 16:05:41 [place], [place] ([id number]) -------------------------------------------------------------------------------- Encounter Discharge Information Details Patient Name: Marcus [name], [name] Date of Service: 09/22/2015 3:00 PM Medical Record Number: [id number] Patient Account Number: [id number] Date of Birth/Sex: 1945/11/21 (70 y.o. Male) Treating RN: Baruch Gouty, RN, BSN, Velva Harman Primary Care Physician: [name] Other Clinician: Referring Physician: [name] Treating  Physician/Extender: Frann Rider in Treatment: 4 Encounter Discharge Information Items Discharge Pain Level: 0 Discharge Condition: Stable Ambulatory Status: Wheelchair Discharge Destination: Home Transportation: Other Accompanied By: family Schedule Follow-up Appointment: No Medication Reconciliation completed No and provided to Patient/Care Vedha Tercero: Provided on Clinical Summary of Care: 09/22/2015 Form Type Recipient Paper Patient [place] Electronic Signature(s) Signed: 09/22/2015 4:57:48 PM By: Regan Lemming BSN, RN Previous Signature: 09/22/2015 4:03:48 PM Version By: Ruthine Dose Entered By: Regan Lemming on 09/22/2015 16:07:52 Marcus [name], Marcus [name] ([id number]) -------------------------------------------------------------------------------- Lower Extremity Assessment Details Patient Name: Monty, Linnie L. Date of Service: 09/22/2015 3:00 PM Medical Record Number: [id number] Patient Account Number: [id number] Date of Birth/Sex: 09-25-1945 (70 y.o. Male) Treating RN: Baruch Gouty, RN, BSN, Velva Harman Primary Care Physician: [name] Other Clinician: Referring Physician: [name] Treating Physician/Extender: Frann Rider in Treatment: 4 Vascular Assessment Pulses: Posterior Tibial Dorsalis Pedis Palpable: [Left:No] [Right:No] Doppler: [Left:Monophasic] [Right:Monophasic] Extremity colors, hair growth, and conditions: Extremity Color: [Left:Mottled] [Right:Mottled] Hair Growth on Extremity: [Left:No] [Right:No] Temperature of Extremity: [Left:Cool] [Right:Cool] Capillary Refill: [Left:< 3 seconds] [Right:< 3 seconds] Toe Nail Assessment Left: Right: Thick: Yes Yes Discolored: Yes Yes Deformed: No No Improper Length and Hygiene: Yes Yes Electronic Signature(s) Signed: 09/22/2015 4:57:48 PM By: Regan Lemming BSN, RN Entered By: Regan Lemming on 09/22/2015 15:31:42 Marcus [name], Marcus [name]  ([id number]) -------------------------------------------------------------------------------- Multi Wound Chart Details Patient Name: Blaney, Finneus L. Date of Service: 09/22/2015 3:00 PM Medical Record Number: [id number] Patient Account Number: [id number] Date of Birth/Sex: Jan 28, 1946 (70 y.o. Male) Treating RN: Baruch Gouty, RN, BSN, Velva Harman Primary Care Physician: [name] Other Clinician: Referring Physician: [name] Treating Physician/Extender: Frann Rider in Treatment: 4 Vital Signs Height(in): 71 Pulse(bpm): 106 Weight(lbs): Blood Pressure 88/64 (mmHg): Body Mass Index(BMI): Temperature(F): Respiratory Rate 18 (breaths/min): Photos: [1:No Photos] [2:No Photos] [3:No Photos] Wound Location: [1:Sacrum] [2:Right Calcaneous] [3:Left Malleolus -  Medial] Wounding Event: [1:Pressure Injury] [2:Pressure Injury] [3:Pressure Injury] Primary Etiology: [1:Pressure Ulcer] [2: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] Comorbid History: [1:Angina, Arrhythmia, Congestive Heart Failure, Coronary Artery Disease, Hypertension, Myocardial Infarction, Type II Diabetes, History of pressure wounds, Osteoarthritis, Dementia, Received Radiation] [2:Angina, Arrhythmia, Congestive  Heart Failure, Coronary Artery Disease, Hypertension, Myocardial Infarction, Type II Diabetes, History of pressure wounds, Osteoarthritis, Dementia, Received Radiation] [3:Angina, Arrhythmia, Congestive Heart Failure, Coronary Artery Disease,  Hypertension, Myocardial Infarction, Type II Diabetes, History of pressure wounds, Osteoarthritis, Dementia, Received Radiation] Date Acquired: [1:05/22/2015] [2:05/22/2015] [3:05/22/2015] Weeks of Treatment: [1:4] [2:4] [3:4] Wound Status: [1:Open] [2:Open] [3:Open] Measurements L x W x D 17x8x0.1 [2:1.9x1x0.1] [3:3.5x2.7x0.1] (cm) Area (cm) : [1:106.814] [2:1.492] [3:7.422] Volume (cm) : [1:10.681] [2:0.149] [3:0.742] % Reduction in Area:  [1:-142.90%] [2:92.40%] [3:-5.00%] % Reduction in Volume: -142.90% [2:92.40%] [3:-5.00%] Classification: [1:Category/Stage III] [2:Category/Stage III] [3:Grade 2] HBO Classification: [1:N/A] [2:Grade 1] [3:N/A] Exudate Amount: [1:Large] [2:Small] [3:Small] Exudate Type: [1:Serosanguineous] [2:Serosanguineous] [3:Serosanguineous] Exudate Color: [1:red, brown] [2:red, brown] [3:red, brown] Foul Odor After [1:Yes] [2:No] [3:No] Cleansing: Marcus [name], [name] ([id number]) Odor Anticipated Due to No N/A N/A Product Use: Wound Margin: Distinct, outline attached Distinct, outline attached Distinct, outline attached Granulation Amount: None Present (0%) Medium (34-66%) None Present (0%) Granulation Quality: N/A Pink, Pale N/A Necrotic Amount: Large (67-100%) Medium (34-66%) Large (67-100%) Necrotic Tissue: Eschar, Adherent Carefree Exposed Structures: Fascia: No Fascia: No Fascia: No Fat: No Fat: No Fat: No Tendon: No Tendon: No Tendon: No Muscle: No Muscle: No Muscle: No Joint: No Joint: No Joint: No Bone: No Bone: No Bone: No Limited to Skin Limited to Skin Limited to Skin Breakdown Breakdown Breakdown Epithelialization: None None Medium (34-66%) Periwound Skin Texture: Edema: No Edema: No Edema: Yes Excoriation: No Excoriation: No Excoriation: No Induration: No Induration: No Induration: No Callus: No Callus: No Callus: No Crepitus: No Crepitus: No Crepitus: No Fluctuance: No Fluctuance: No Fluctuance: No Friable: No Friable: No Friable: No Rash: No Rash: No Rash: No Scarring: No Scarring: No Scarring: No Periwound Skin Moist: Yes Moist: Yes Moist: Yes Moisture: Maceration: No Maceration: No Maceration: No Dry/Scaly: No Dry/Scaly: No Dry/Scaly: No Periwound Skin Color: Atrophie Blanche: No Atrophie Blanche: No Atrophie Blanche: No Cyanosis: No Cyanosis: No Cyanosis: No Ecchymosis: No Ecchymosis: No Ecchymosis:  No Erythema: No Erythema: No Erythema: No Hemosiderin Staining: No Hemosiderin Staining: No Hemosiderin Staining: No Mottled: No Mottled: No Mottled: No Pallor: No Pallor: No Pallor: No Rubor: No Rubor: No Rubor: No Temperature: No Abnormality No Abnormality No Abnormality Tenderness on No No No Palpation: Wound Preparation: Ulcer Cleansing: Ulcer Cleansing: Ulcer Cleansing: Rinsed/Irrigated with Rinsed/Irrigated with Rinsed/Irrigated with Saline Saline Saline Topical Anesthetic Topical Anesthetic Topical Anesthetic Applied: Other: lidocaine Applied: Other: lidocaine Applied: Other: lidocaine 4% 4% 4% Wound Number: '4 5 6 '$ Photos: No Photos No Photos No Photos Wound Location: Left Lower Leg - Lateral Left Ischial Tuberosity Right Gluteus Schlee, [name] 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 Comorbid History: Angina, Arrhythmia, Angina, Arrhythmia, Angina, Arrhythmia, Congestive Heart Failure, Congestive Heart Failure, Congestive Heart Failure, Coronary Artery Disease, Coronary Artery Disease, Coronary Artery Disease, Hypertension, Myocardial Hypertension, Myocardial Hypertension, Myocardial Infarction, Type II Infarction, Type II Infarction, Type II Diabetes, History of Diabetes, History of Diabetes, History of pressure wounds, pressure wounds, pressure wounds, Osteoarthritis, Dementia, Osteoarthritis, Dementia, Osteoarthritis, Dementia, Received Radiation Received Radiation Received Radiation Date Acquired: 09/11/2015 09/10/2015 09/10/2015 Weeks  of Treatment: 0 0 0 Wound Status: Open Open Open Measurements L x W x D 10x2.5x0.1 5x3.3x0.1 2.5x1.5x0.5 (cm) Area (cm) : 19.635 12.959 2.945 Volume (cm) : 1.963 1.296 1.473 % Reduction in Area: 0.00% 0.00% 0.00% % Reduction in Volume: 0.00% 0.00% 0.00% Classification: Unable to  visualize wound Unstageable/Unclassified Unstageable/Unclassified bed HBO Classification: N/A N/A N/A Exudate Amount: None Present None Present None Present Exudate Type: N/A N/A N/A Exudate Color: N/A N/A N/A Foul Odor After No No No Cleansing: Odor Anticipated Due to N/A N/A N/A Product Use: Wound Margin: Distinct, outline attached Distinct, outline attached Distinct, outline attached Granulation Amount: None Present (0%) None Present (0%) None Present (0%) Granulation Quality: N/A N/A N/A Necrotic Amount: Large (67-100%) Large (67-100%) Large (67-100%) Necrotic Tissue: Eschar Eschar N/A Exposed Structures: Fascia: No Fascia: No Fascia: No Fat: No Fat: No Fat: No Tendon: No Tendon: No Tendon: No Muscle: No Muscle: No Muscle: No Joint: No Joint: No Joint: No Bone: No Bone: No Bone: No Limited to Skin Limited to Skin Limited to Skin Breakdown Breakdown Breakdown Epithelialization: None N/A None Periwound Skin Texture: Edema: No Edema: No Edema: No Excoriation: No Excoriation: No Excoriation: No Induration: No Induration: No Induration: No Callus: No Callus: No Callus: No Crepitus: No Crepitus: No Crepitus: No Sinn, Saintclair L. ([id number]) Fluctuance: No Fluctuance: No Fluctuance: No Friable: No Friable: No Friable: No Rash: No Rash: No Rash: No Scarring: No Scarring: No Scarring: No Periwound Skin Maceration: No Dry/Scaly: Yes Dry/Scaly: Yes Moisture: Moist: No Maceration: No Maceration: No Dry/Scaly: No Moist: No Moist: No Periwound Skin Color: Atrophie Blanche: No Atrophie Blanche: No Atrophie Blanche: No Cyanosis: No Cyanosis: No Cyanosis: No Ecchymosis: No Ecchymosis: No Ecchymosis: No Erythema: No Erythema: No Erythema: No Hemosiderin Staining: No Hemosiderin Staining: No Hemosiderin Staining: No Mottled: No Mottled: No Mottled: No Pallor: No Pallor: No Pallor: No Rubor: No Rubor: No Rubor: No Temperature: No  Abnormality No Abnormality N/A Tenderness on No No No Palpation: Wound Preparation: Ulcer Cleansing: Ulcer Cleansing: Ulcer Cleansing: Rinsed/Irrigated with Rinsed/Irrigated with Rinsed/Irrigated with Saline Saline Saline Topical Anesthetic Topical Anesthetic Topical Anesthetic Applied: Other: lidocaine Applied: Other: lidocaine Applied: Other: lidocaine 4% 4% 4% Treatment Notes Electronic Signature(s) Signed: 09/22/2015 4:57:48 PM By: Regan Lemming BSN, RN Entered By: Regan Lemming on 09/22/2015 16:01:20 Marcus [name], Marcus [name] ([id number]) -------------------------------------------------------------------------------- [place] Details Patient Name: Riedinger, Antolin L. Date of Service: 09/22/2015 3:00 PM Medical Record Number: [id number] Patient Account Number: [id number] Date of Birth/Sex: April 07, 1946 (70 y.o. Male) Treating RN: Baruch Gouty, RN, BSN, Velva Harman Primary Care Physician: [name] Other Clinician: Referring Physician: [name] Treating Physician/Extender: Frann Rider in Treatment: 4 Active Inactive Electronic Signature(s) Signed: 10/25/2015 12:51:17 PM By: Regan Lemming BSN, RN Previous Signature: 09/22/2015 4:57:48 PM Version By: Regan Lemming BSN, RN Entered By: Regan Lemming on 10/25/2015 12:51:16 Krager, Farris L. ([id number]) -------------------------------------------------------------------------------- Pain Assessment Details Patient Name: Goetze, Donyae L. Date of Service: 09/22/2015 3:00 PM Medical Record Number: [id number] Patient Account Number: [id number] Date of Birth/Sex: 11/01/1945 (70 y.o. Male) Treating RN: Baruch Gouty, RN, BSN, Velva Harman Primary Care Physician: [name] Other Clinician: Referring Physician: [name] Treating Physician/Extender: Frann Rider in Treatment: 4 Active Problems Location of Pain Severity and Description of Pain Patient Has Paino No Site Locations Pain Management and  Medication Current Pain Management: Electronic Signature(s) Signed: 09/22/2015 4:57:48 PM By: Regan Lemming BSN, RN Entered By: Regan Lemming on 09/22/2015 15:15:50 Marcus [name], Marcus [name] ([id number]) -------------------------------------------------------------------------------- Patient/Caregiver Education Details Patient Name: Dematteo, Terran L.  Date of Service: 09/22/2015 3:00 PM Medical Record Number: [id number] Patient Account Number: [id number] Date of Birth/Gender: 09/27/1945 (70 y.o. Male) Treating RN: Baruch Gouty, RN, BSN, Velva Harman Primary Care Physician: [name] Other Clinician: Referring Physician: [name] Treating Physician/Extender: Frann Rider in Treatment: 4 Education Assessment Education Provided To: Caregiver Education Topics Provided Pressure: Methods: Explain/Verbal Responses: Reinforcements needed Welcome To [facility] [place]: Methods: Explain/Verbal Responses: State content correctly Wound/Skin Impairment: Methods: Explain/Verbal Responses: Reinforcements needed Electronic Signature(s) Signed: 09/22/2015 4:57:48 PM By: Regan Lemming BSN, RN Entered By: Regan Lemming on 09/22/2015 [place], [place] ([id number]) -------------------------------------------------------------------------------- Wound Assessment Details Patient Name: Marcus [name], [name] Date of Service: 09/22/2015 3:00 PM Medical Record Number: [id number] Patient Account Number: [id number] Date of Birth/Sex: 1945-11-02 (70 y.o. Male) Treating RN: Baruch Gouty, RN, BSN, [place] Primary Care Physician: [name] Other Clinician: Referring Physician: [name] Treating Physician/Extender: Frann Rider in Treatment: 4 Wound Status Wound Number: 1 Primary Pressure Ulcer Etiology: Wound Location: Sacrum Wound Open Wounding Event: Pressure Injury Status: Date Acquired: 05/22/2015 Comorbid Angina, Arrhythmia, Congestive Heart Weeks Of Treatment:  4 History: Failure, Coronary Artery Disease, Clustered Wound: No Hypertension, Myocardial Infarction, Type II Diabetes, History of pressure wounds, Osteoarthritis, Dementia, Received Radiation Photos Photo Uploaded By: Regan Lemming on 09/22/2015 16:53:05 Wound Measurements Length: (cm) 17 % Reduction in Width: (cm) 8 % Reduction in Depth: (cm) 0.1 Epithelializat Area: (cm) 106.814 Tunneling: Volume: (cm) 10.681 Undermining: Area: -142.9% Volume: -142.9% ion: None No No Wound Description Classification: Category/Stage III Foul Odor Aft Haaland, Dyron L. ([id number]) er Cleansing: Yes Wound Margin: Distinct, outline attached Due to Product Use: No Exudate Amount: Large Exudate Type: Serosanguineous Exudate Color: red, brown Wound Bed Granulation Amount: None Present (0%) Exposed Structure Necrotic Amount: Large (67-100%) Fascia Exposed: No Necrotic Quality: Eschar, Adherent Marcus [name] Fat Layer Exposed: No Tendon Exposed: No Muscle Exposed: No Joint Exposed: No Bone Exposed: No Limited to Skin Breakdown Periwound Skin Texture Texture Color No Abnormalities Noted: No No Abnormalities Noted: No Callus: No Atrophie Blanche: No Crepitus: No Cyanosis: No Excoriation: No Ecchymosis: No Fluctuance: No Erythema: No Friable: No Hemosiderin Staining: No Induration: No Mottled: No Localized Edema: No Pallor: No Rash: No Rubor: No Scarring: No Temperature / Pain Moisture Temperature: No Abnormality No Abnormalities Noted: No Dry / Scaly: No Maceration: No Moist: Yes Wound Preparation Ulcer Cleansing: Rinsed/Irrigated with Saline Topical Anesthetic Applied: Other: lidocaine 4%, Electronic Signature(s) Signed: 09/22/2015 3:38:18 PM By: Regan Lemming BSN, RN Entered By: Regan Lemming on 09/22/2015 15:38:18 [place], [place] ([id number]) -------------------------------------------------------------------------------- Wound Assessment Details Patient Name:  Marcus [name], [name] Date of Service: 09/22/2015 3:00 PM Medical Record Number: [id number] Patient Account Number: [id number] Date of Birth/Sex: 02-06-1946 (70 y.o. Male) Treating RN: Baruch Gouty, RN, BSN, [place] Primary Care Physician: [name] Other Clinician: Referring Physician: [name] Treating Physician/Extender: Frann Rider in Treatment: 4 Wound Status Wound Number: 2 Primary Pressure Ulcer Etiology: Wound Location: Right Calcaneous Wound Open Wounding Event: Pressure Injury Status: Date Acquired: 05/22/2015 Comorbid Angina, Arrhythmia, Congestive Heart Weeks Of Treatment: 4 History: Failure, Coronary Artery Disease, Clustered Wound: No Hypertension, Myocardial Infarction, Type II Diabetes, History of pressure wounds, Osteoarthritis, Dementia, Received Radiation Photos Photo Uploaded By: Regan Lemming on 09/22/2015 16:53:05 Wound Measurements Length: (cm) 1.9 % Reduction in Width: (cm) 1 % Reduction in Depth: (cm) 0.1 Epithelializat Area: (cm) 1.492 Tunneling: Volume: (cm) 0.149 Undermining: Area: 92.4% Volume: 92.4% ion: None No No Wound Description Classification: Category/Stage III Foul Odor Aft Kundrat, Dixie L. ([id number]) er  Cleansing: No Diabetic Severity Earleen [place]): Grade 1 Wound Margin: Distinct, outline attached Exudate Amount: Small Exudate Type: Serosanguineous Exudate Color: red, brown Wound Bed Granulation Amount: Medium (34-66%) Exposed Structure Granulation Quality: Pink, Pale Fascia Exposed: No Necrotic Amount: Medium (34-66%) Fat Layer Exposed: No Necrotic Quality: Adherent Marcus [name] Tendon Exposed: No Muscle Exposed: No Joint Exposed: No Bone Exposed: No Limited to Skin Breakdown Periwound Skin Texture Texture Color No Abnormalities Noted: No No Abnormalities Noted: No Callus: No Atrophie Blanche: No Crepitus: No Cyanosis: No Excoriation: No Ecchymosis: No Fluctuance: No Erythema: No Friable:  No Hemosiderin Staining: No Induration: No Mottled: No Localized Edema: No Pallor: No Rash: No Rubor: No Scarring: No Temperature / Pain Moisture Temperature: No Abnormality No Abnormalities Noted: No Dry / Scaly: No Maceration: No Moist: Yes Wound Preparation Ulcer Cleansing: Rinsed/Irrigated with Saline Topical Anesthetic Applied: Other: lidocaine 4%, Electronic Signature(s) Signed: 09/22/2015 3:38:47 PM By: Regan Lemming BSN, RN Entered By: Regan Lemming on 09/22/2015 15:38:47 Stuck, Joas L. ([id number]) -------------------------------------------------------------------------------- Wound Assessment Details Patient Name: Longstreth, Shadeed L. Date of Service: 09/22/2015 3:00 PM Medical Record Number: [id number] Patient Account Number: [id number] Date of Birth/Sex: 1946/08/18 (70 y.o. 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, Coronary Artery Disease, Clustered Wound: No Hypertension, Myocardial Infarction, Type II Diabetes, History of pressure wounds, Osteoarthritis, Dementia, Received Radiation Photos Photo Uploaded By: Regan Lemming on 09/22/2015 16:53:06 Wound Measurements Length: (cm) 3.5 Width: (cm) 2.7 Depth: (cm) 0.1 Area: (cm) 7.422 Volume: (cm) 0.742 % Reduction in Area: -5% % Reduction in Volume: -5% Epithelialization: Medium (34-66%) Tunneling: No Undermining: No Wound Description Classification: Grade 2 Foul Odor Aft Wound Margin: Distinct, outline attached Exudate Amount: Small Exudate  Type: Serosanguineous Exudate Color: red, brown er Cleansing: No Wound Bed Granulation Amount: None Present (0%) Exposed Structure Ellenwood, Novah L. ([id number]) Necrotic Amount: Large (67-100%) Fascia Exposed: No Necrotic Quality: Adherent Marcus [name] Fat Layer Exposed: No Tendon Exposed: No Muscle Exposed: No Joint Exposed: No Bone Exposed: No Limited to Skin Breakdown Periwound Skin Texture Texture Color No Abnormalities Noted: No No Abnormalities Noted: No Callus: No Atrophie Blanche: No Crepitus: No Cyanosis: No Excoriation: No Ecchymosis: No Fluctuance: No Erythema: No Friable: No Hemosiderin Staining: No Induration: No Mottled: No Localized Edema: Yes Pallor: No Rash: No Rubor: No Scarring: No Temperature / Pain Moisture Temperature: No Abnormality No Abnormalities Noted: No Dry / Scaly: No Maceration: No Moist: Yes Wound Preparation Ulcer Cleansing: Rinsed/Irrigated with Saline Topical Anesthetic Applied: Other: lidocaine 4%, Electronic Signature(s) Signed: 09/22/2015 3:39:12 PM By: Regan Lemming BSN, RN Entered By: Regan Lemming on 09/22/2015 15:39:12 Marcus [name], [place] ([id number]) -------------------------------------------------------------------------------- Wound Assessment Details Patient Name: Wey, Jarmal L. Date of Service: 09/22/2015 3:00 PM Medical Record Number: [id number] Patient Account Number: [id number] Date of Birth/Sex: December 15, 1945 (70 y.o. Male) Treating RN: Baruch Gouty, RN, BSN, Velva Harman Primary Care Physician: [name] Other Clinician: Referring Physician: [name] Treating Physician/Extender: Frann Rider in Treatment: 4 Wound Status Wound Number: 4 Primary Diabetic Wound/Ulcer of the Lower Etiology: Extremity Wound Location: Left Lower Leg - Lateral Wound Open Wounding Event: Gradually Appeared Status: Date Acquired: 09/11/2015 Comorbid Angina, Arrhythmia, Congestive Heart Weeks Of Treatment: 0 History:  Failure,  Coronary Artery Disease, Clustered Wound: No Hypertension, Myocardial Infarction, Type II Diabetes, History of pressure wounds, Osteoarthritis, Dementia, Received Radiation Photos Photo Uploaded By: Regan Lemming on 09/22/2015 16:54:20 Wound Measurements Length: (cm) 10 % Reduction in Width: (cm) 2.5 % Reduction in Depth: (cm) 0.1 Epithelializat Area: (cm) 19.635 Tunneling: Volume: (cm) 1.963 Undermining: Area: 0% Volume: 0% ion: None No No Wound Description Classification: Unable to visualize wound bed Foul Odor Aft Wound Margin: Distinct, outline attached Exudate Amount: None Present er Cleansing: No Wound Bed Granulation Amount: None Present (0%) Exposed Structure Necrotic Amount: Large (67-100%) Fascia Exposed: No Necrotic Quality: Eschar Fat Layer Exposed: No Ketelsen, Jovanny L. ([id number]) Tendon Exposed: No Muscle Exposed: No Joint Exposed: No Bone Exposed: No Limited to Skin Breakdown Periwound Skin Texture Texture Color No Abnormalities Noted: No No Abnormalities Noted: No Callus: No Atrophie Blanche: No Crepitus: No Cyanosis: No Excoriation: No Ecchymosis: No Fluctuance: No Erythema: No Friable: No Hemosiderin Staining: No Induration: No Mottled: No Localized Edema: No Pallor: No Rash: No Rubor: No Scarring: No Temperature / Pain Moisture Temperature: No Abnormality No Abnormalities Noted: No Dry / Scaly: No Maceration: No Moist: No Wound Preparation Ulcer Cleansing: Rinsed/Irrigated with Saline Topical Anesthetic Applied: Other: lidocaine 4%, Electronic Signature(s) Signed: 09/22/2015 3:40:44 PM By: Regan Lemming BSN, RN Entered By: Regan Lemming on 09/22/2015 15:40:44 Mclinden, Seabrook ([id number]) -------------------------------------------------------------------------------- Wound Assessment Details Patient Name: Bechtold, Newel L. Date of Service: 09/22/2015 3:00 PM Medical Record Number: [id number] Patient Account  Number: [id number] Date of Birth/Sex: 16-Feb-1946 (70 y.o. Male) Treating RN: Baruch Gouty, RN, BSN, [place] Primary Care Physician: [name] Other Clinician: Referring Physician: [name] Treating Physician/Extender: Frann Rider in Treatment: 4 Wound Status Wound Number: 5 Primary Pressure Ulcer Etiology: Wound Location: Left Ischial Tuberosity Wound Open Wounding Event: Pressure Injury Status: Date Acquired: 09/10/2015 Comorbid Angina, Arrhythmia, Congestive Heart Weeks Of Treatment: 0 History: Failure, Coronary Artery Disease, Clustered Wound: No Hypertension, Myocardial Infarction, Type II Diabetes, History of pressure wounds, Osteoarthritis, Dementia, Received Radiation Photos Photo Uploaded By: Regan Lemming on 09/22/2015 16:54:20 Wound Measurements Length: (cm) 5 % Reduction i Width: (cm) 3.3 % Reduction i Depth: (cm) 0.1 Tunneling: Area: (cm) 12.959 Undermining: Volume: (cm) 1.296 n Area: 0% n Volume: 0% No No Wound Description Classification: Unstageable/Unclassified Wound Margin: Distinct, outline attached Exudate Amount: None Present Foul Odor After Cleansing: No Wound Bed Granulation Amount: None Present (0%) Exposed Structure Necrotic Amount: Large (67-100%) Fascia Exposed: No Necrotic Quality: Eschar Fat Layer Exposed: No Freehling, Srikar L. ([id number]) Tendon Exposed: No Muscle Exposed: No Joint Exposed: No Bone Exposed: No Limited to Skin Breakdown Periwound Skin Texture Texture Color No Abnormalities Noted: No No Abnormalities Noted: No Callus: No Atrophie Blanche: No Crepitus: No Cyanosis: No Excoriation: No Ecchymosis: No Fluctuance: No Erythema: No Friable: No Hemosiderin Staining: No Induration: No Mottled: No Localized Edema: No Pallor: No Rash: No Rubor: No Scarring: No Temperature / Pain Moisture Temperature: No Abnormality No Abnormalities Noted: No Dry / Scaly: Yes Maceration: No Moist:  No Wound Preparation Ulcer Cleansing: Rinsed/Irrigated with Saline Topical Anesthetic Applied: Other: lidocaine 4%, Electronic Signature(s) Signed: 09/22/2015 3:41:25 PM By: Regan Lemming BSN, RN Entered By: Regan Lemming on 09/22/2015 15:41:25 Sprung, Sanderson ([id number]) -------------------------------------------------------------------------------- Wound Assessment Details Patient Name: Gill, Deanglo L. Date of Service: 09/22/2015 3:00 PM Medical Record Number: [id number] Patient Account Number: [id number] Date of Birth/Sex: 04-08-1946 (70 y.o. Male) Treating RN: Baruch Gouty, RN, BSN, Velva Harman Primary Care Physician: [name] Other Clinician: Referring Physician: [name] Treating  Physician/Extender: Frann Rider in Treatment: 4 Wound Status Wound Number: 6 Primary Pressure Ulcer Etiology: Wound Location: Right Gluteus Wound Open Wounding Event: Pressure Injury Status: Date Acquired: 09/10/2015 Comorbid Angina, Arrhythmia, Congestive Heart Weeks Of Treatment: 0 History: Failure, Coronary Artery Disease, Clustered Wound: No Hypertension, Myocardial Infarction, Type II Diabetes, History of pressure wounds, Osteoarthritis, Dementia, Received Radiation Photos Photo Uploaded By: Regan Lemming on 09/22/2015 16:54:20 Wound Measurements Length: (cm) 2.5 % Reduction in Width: (cm) 1.5 % Reduction in Depth: (cm) 0.5 Epithelializat Area: (cm) 2.945 Tunneling: Volume: (cm) 1.473 Undermining: Area: 0% Volume: 0% ion: None No No Wound Description Classification: Unstageable/Unclassified Wound Margin: Distinct, outline attached Exudate Amount: None Present Foul Odor After Cleansing: No Wound Bed Granulation Amount: None Present (0%) Exposed Structure Necrotic Amount: Large (67-100%) Fascia Exposed: No Fat Layer Exposed: No Ruda, Adrik L. ([id number]) Tendon Exposed: No Muscle Exposed: No Joint Exposed: No Bone Exposed: No Limited to Skin  Breakdown Periwound Skin Texture Texture Color No Abnormalities Noted: No No Abnormalities Noted: No Callus: No Atrophie Blanche: No Crepitus: No Cyanosis: No Excoriation: No Ecchymosis: No Fluctuance: No Erythema: No Friable: No Hemosiderin Staining: No Induration: No Mottled: No Localized Edema: No Pallor: No Rash: No Rubor: No Scarring: No Moisture No Abnormalities Noted: No Dry / Scaly: Yes Maceration: No Moist: No Wound Preparation Ulcer Cleansing: Rinsed/Irrigated with Saline Topical Anesthetic Applied: Other: lidocaine 4%, Electronic Signature(s) Signed: 09/22/2015 3:42:16 PM By: Regan Lemming BSN, RN Entered By: Regan Lemming on 09/22/2015 15:42:16 Furgason, [place] ([id number]) -------------------------------------------------------------------------------- Vitals Details Patient Name: Crook, Mykale L. Date of Service: 09/22/2015 3:00 PM Medical Record Number: [id number] Patient Account Number: [id number] Date of Birth/Sex: 15-Oct-1945 (70 y.o. Male) Treating RN: Baruch Gouty, RN, BSN, Velva Harman Primary Care Physician: [name] Other Clinician: Referring Physician: [name] Treating Physician/Extender: Frann Rider in Treatment: 4 Vital Signs Time Taken: 15:15 Pulse (bpm): 106 Height (in): 71 Respiratory Rate (breaths/min): 18 Blood Pressure (mmHg): 88/64 Reference Range: 80 - 120 mg / dl Electronic Signature(s) Signed: 09/22/2015 4:57:48 PM By: Regan Lemming BSN, RN Entered By: Regan Lemming on 09/22/2015 15:15:18

## 2015-10-18 DEATH — deceased

## 2015-10-26 ENCOUNTER — Ambulatory Visit: Payer: Medicare (Managed Care)

## 2015-11-22 ENCOUNTER — Ambulatory Visit: Payer: Medicare (Managed Care) | Attending: Radiation Oncology | Admitting: Radiation Oncology

## 2015-11-22 ENCOUNTER — Ambulatory Visit: Payer: Medicare (Managed Care) | Admitting: Radiation Oncology

## 2015-11-22 ENCOUNTER — Inpatient Hospital Stay
Admission: RE | Admit: 2015-11-22 | Payer: Medicare (Managed Care) | Source: Ambulatory Visit | Admitting: Radiation Oncology

## 2017-03-10 IMAGING — CT CT CHEST W/O CM
2 of 3 series · 15 of 36 positions shown, 18 images · non-contrast
Comparison: 04/28/2013

CLINICAL DATA: History of left upper lobe pulmonary nodule.

EXAM:
CT CHEST WITHOUT CONTRAST
TECHNIQUE: Multidetector CT imaging of the chest was performed following the
standard protocol without IV contrast.

[Series 4: soft tissue- · axial · 0.62mm/px · z∈[-564,-279]mm · 12 of 69 slices shown, 15 images]
[im 6/69  mediastinal]
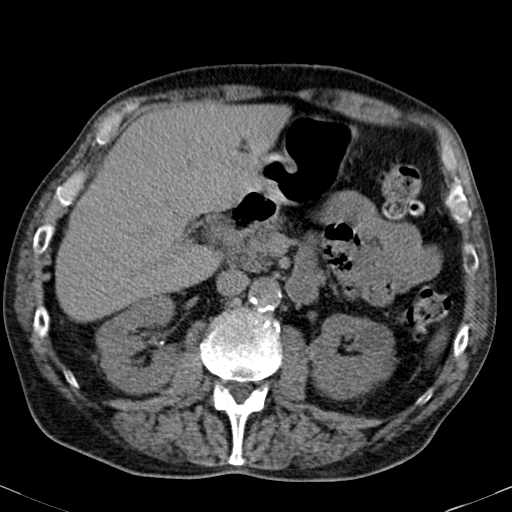
[im 6/69  lung]
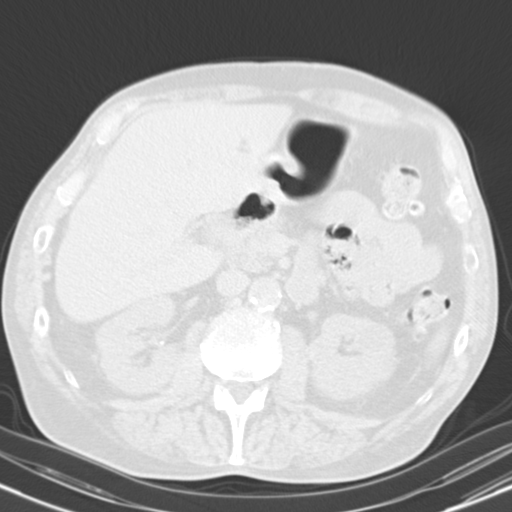
[im 11/69  lung]
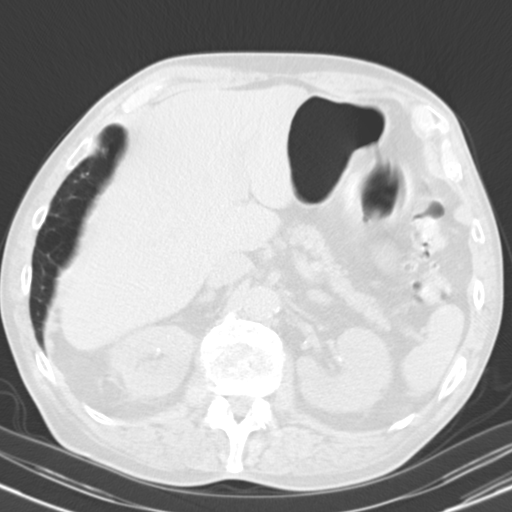
[im 16/69  lung]
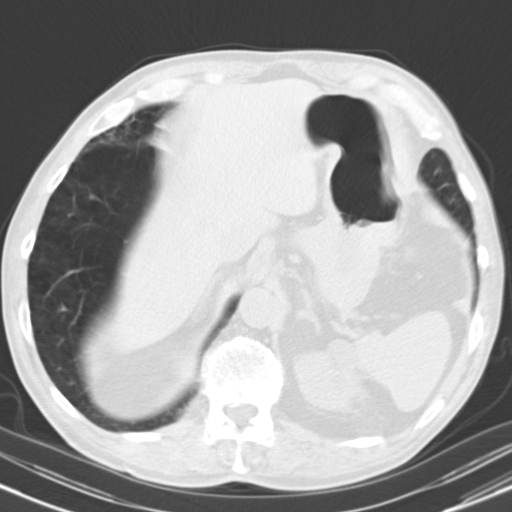
[im 21/69  lung]
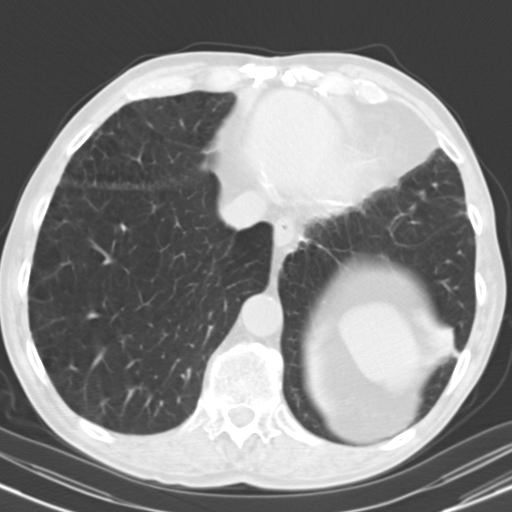
[im 26/69  mediastinal]
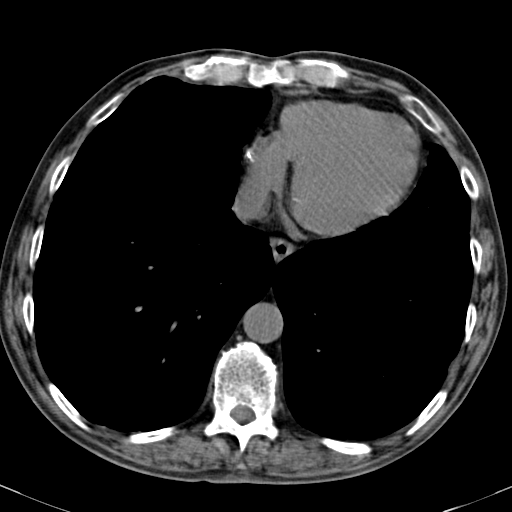
[im 26/69  lung]
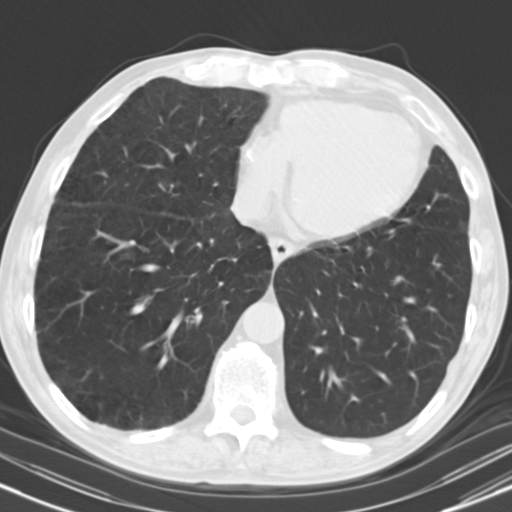
[im 31/69  lung]
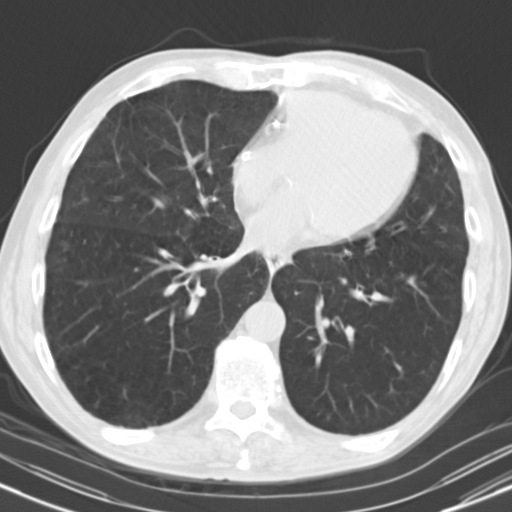
[im 38/69  lung]
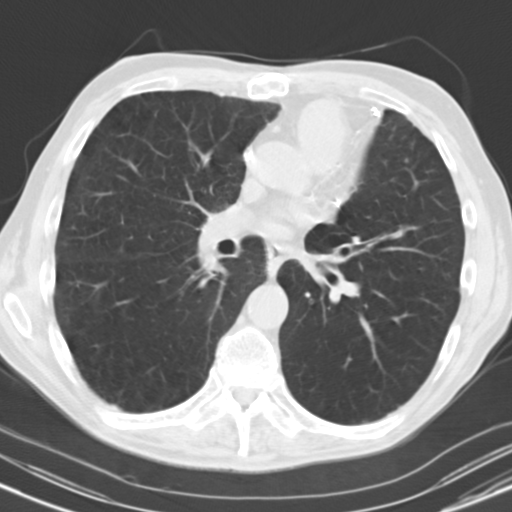
[im 43/69  lung]
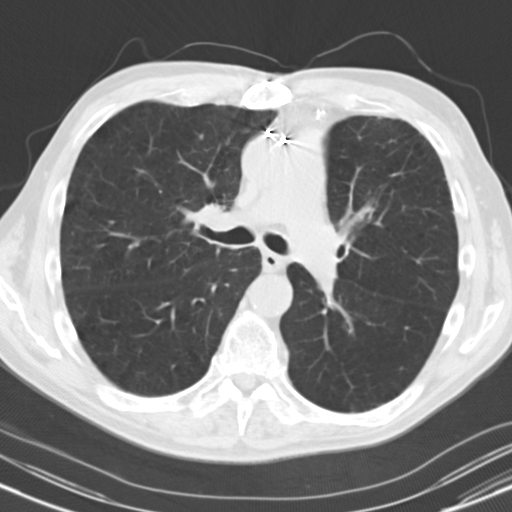
[im 48/69  mediastinal]
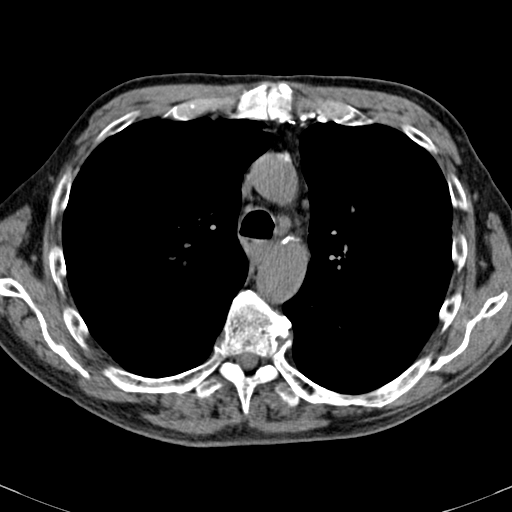
[im 48/69  lung]
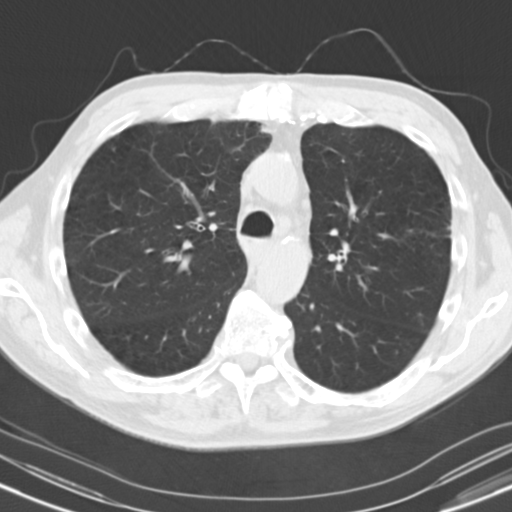
[im 53/69  lung]
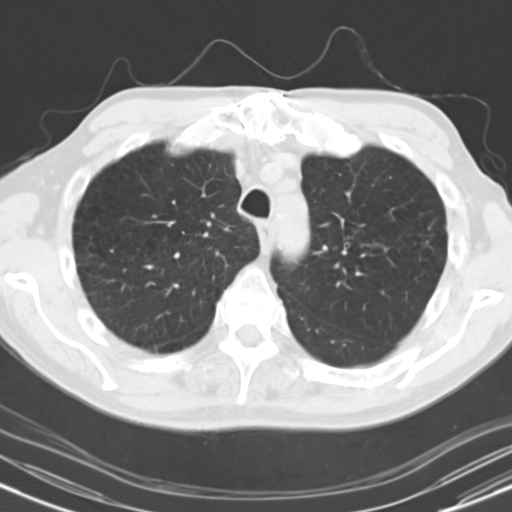
[im 58/69  lung]
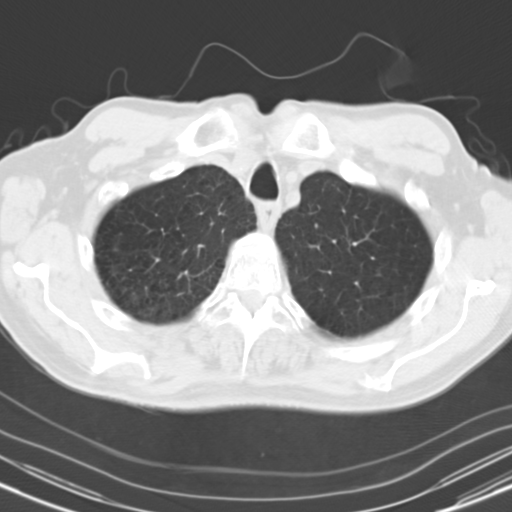
[im 63/69  lung]
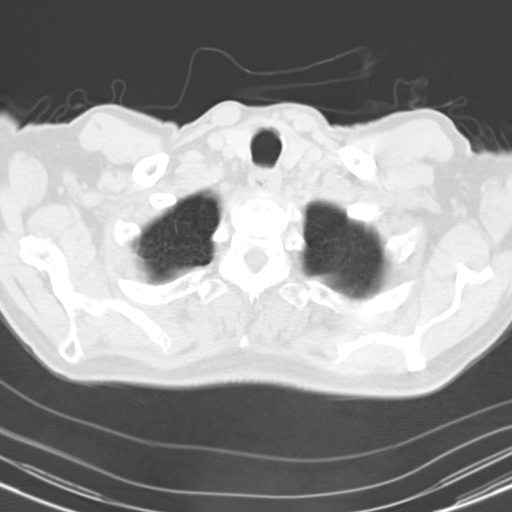

[Series 602: coronals · coronal · 0.67mm/px · 3 of 140 slices shown]
[im 28/140  lung]
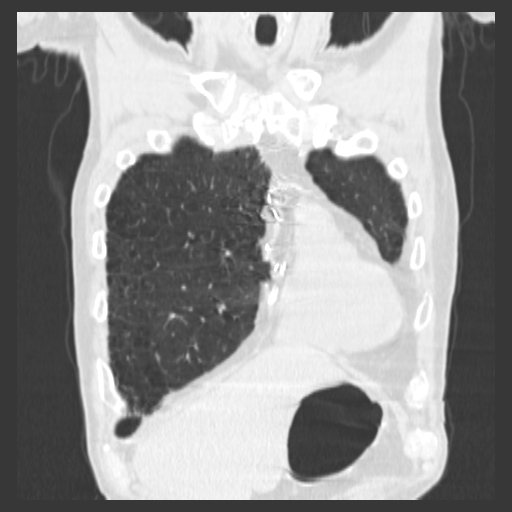
[im 56/140  lung]
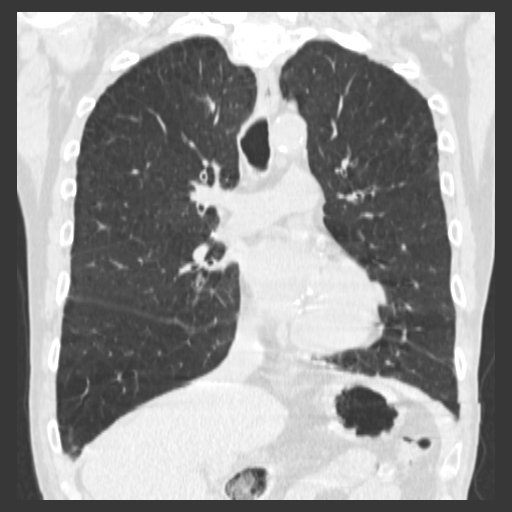
[im 84/140  lung]
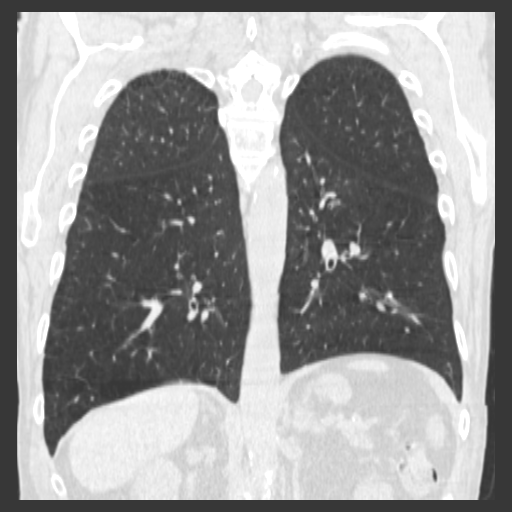

[15 of 36 positions shown; findings below may reference images not displayed]

FINDINGS: There is enlargement of a spiculated nodular mass in the lateral
aspect of the left upper lobe. This nodule now measures 1.9 x 1.5 x
1.7 cm (previously 12 mm in greatest diameter). Enlargement is
highly worrisome for malignancy. Recommend Thoracic Surgery
consultation. PET scan should also be considered for staging
evaluation.

There are some small/ borderline mediastinal lymph nodes which
appears stable in size. A lymph node just anterior to the distal
trachea and carina measures 12 mm in short axis. An AP window lymph
node measures 8 mm in short axis. Hilar lymph node evaluation is
limited without IV contrast. However, no obvious hilar masses are
identified.

Lungs show evidence of mild emphysematous disease in both lungs. No
additional nodules are identified. No evidence of edema, infiltrate,
pneumothorax or pleural fluid.

The patient has had prior CABG. The heart size is normal. No
pericardial abnormalities. Visualized upper abdominal structures are
unremarkable. Mild degenerative changes are present in the lower
thoracic spine. No focal bony lesions are identified.
IMPRESSION: Interval enlargement of left upper lobe nodule. The spiculated
nodule has more than doubled in volume and measures 1.9 cm in
greatest diameter. This is highly worrisome for malignancy. Some
associated small/borderline mediastinal lymph nodes appears stable
since the prior CT. Recommend Thoracic Surgical consultation and
consideration of PET scan for staging evaluation.

These results will be called to the ordering clinician or
representative by the Radiologist Assistant, and communication
documented in the PACS or zVision Dashboard.

## 2017-03-27 IMAGING — CT CT BIOPSY
1 of 2 series · 14 of 32 positions shown, 19 images · non-contrast
Comparison: none

CLINICAL DATA: 69-year-old male with a history of left upper lobe
nodule.

[Series 2: routine chest · axial · 0.63mm/px · z∈[-318,-78]mm · 14 of 56 slices shown, 19 images]
[im 4/56  soft-tissue]
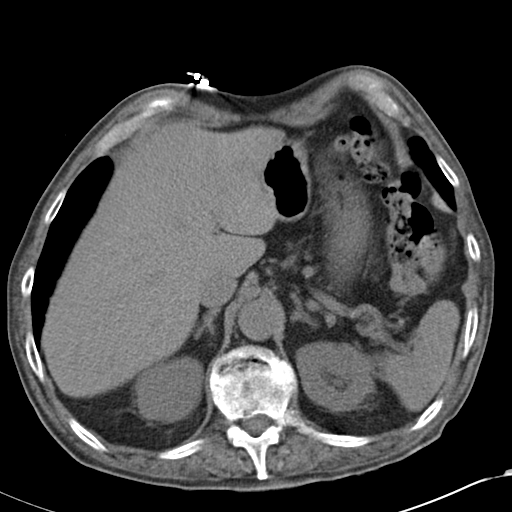
[im 4/56  bone]
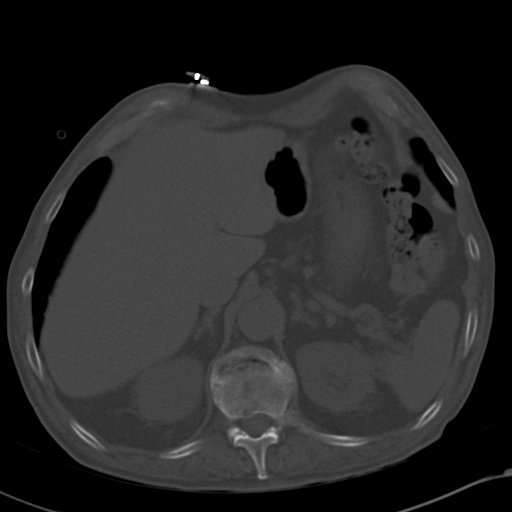
[im 7/56  soft-tissue]
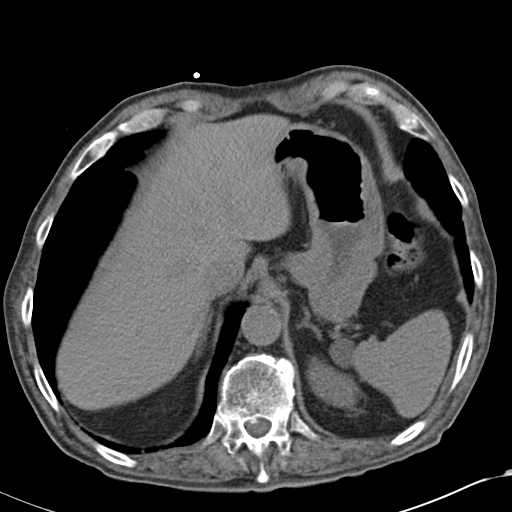
[im 13/56  soft-tissue]
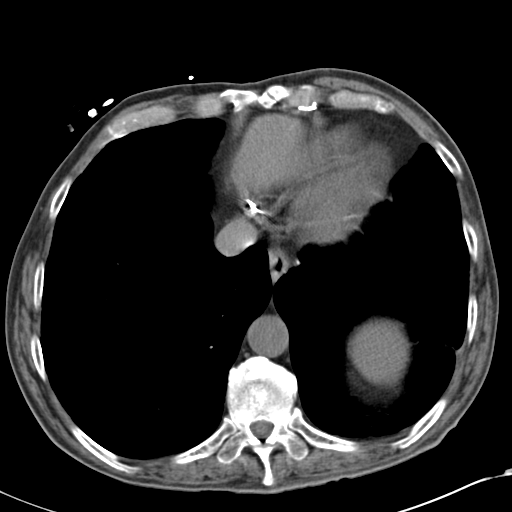
[im 16/56  soft-tissue]
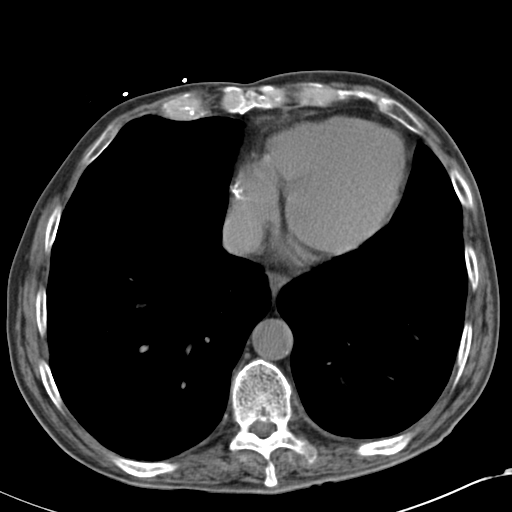
[im 19/56  soft-tissue]
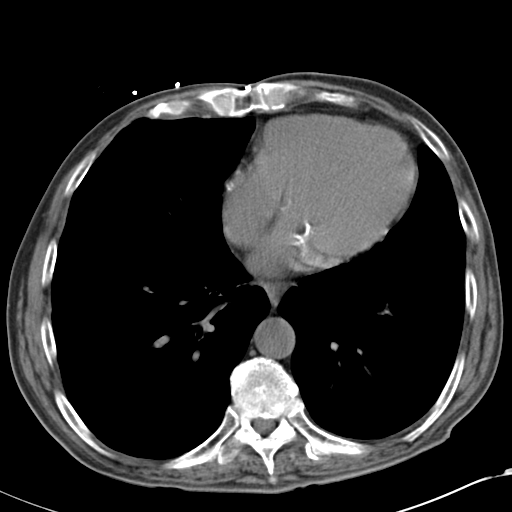
[im 25/56  soft-tissue]
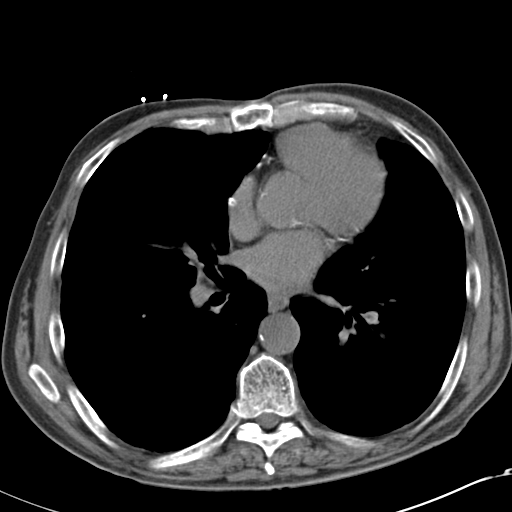
[im 28/56  soft-tissue]
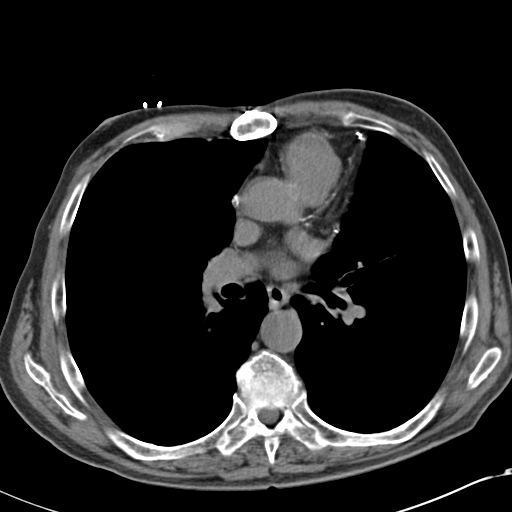
[im 31/56  soft-tissue]
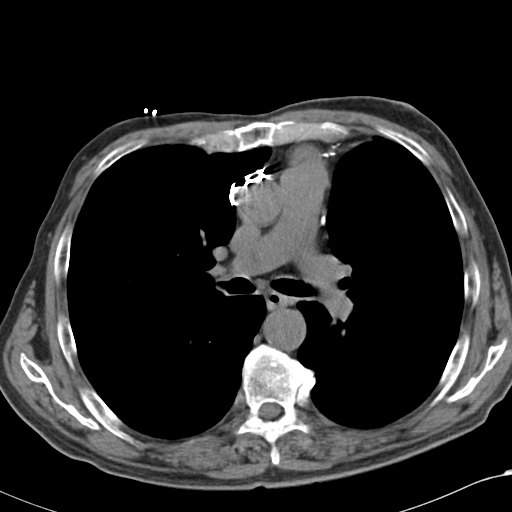
[im 37/56  soft-tissue]
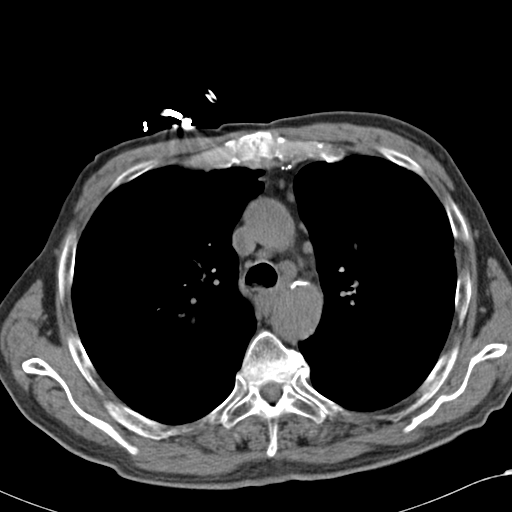
[im 37/56  bone]
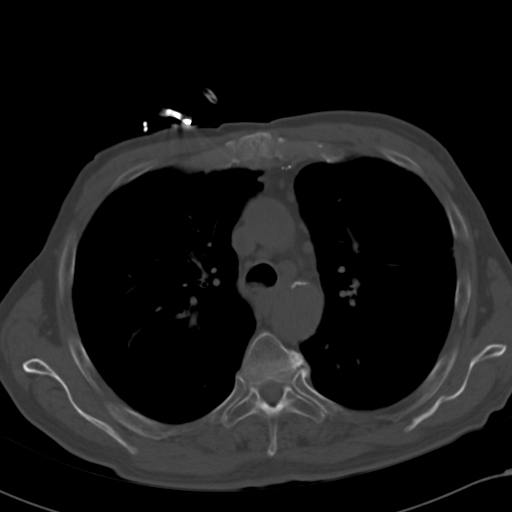
[im 40/56  soft-tissue]
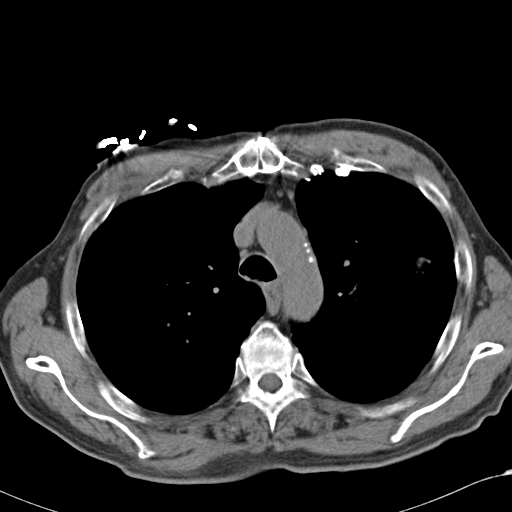
[im 43/56  soft-tissue]
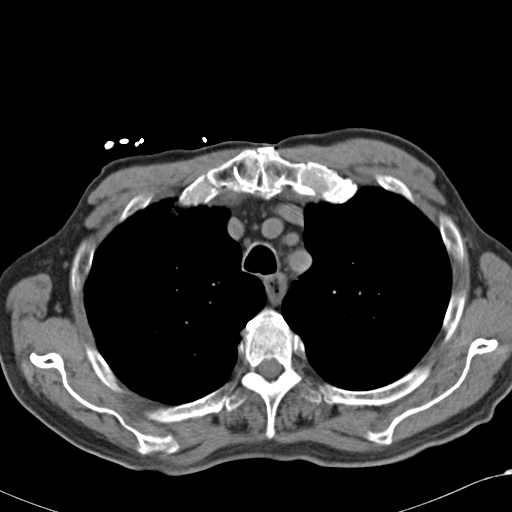
[im 43/56  lung]
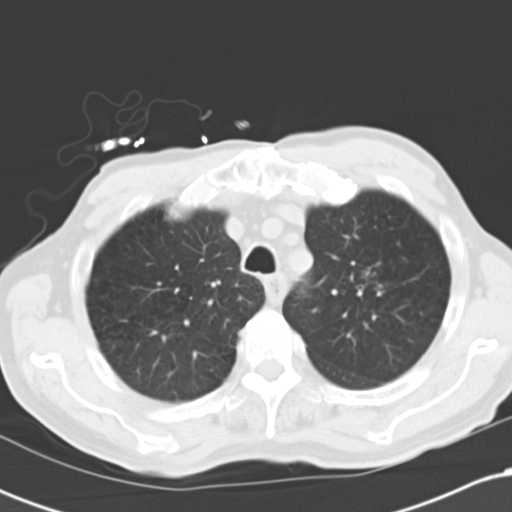
[im 46/56  lung]
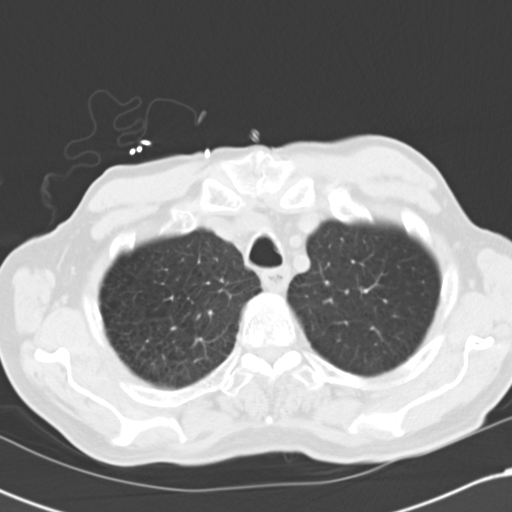
[im 49/56  soft-tissue]
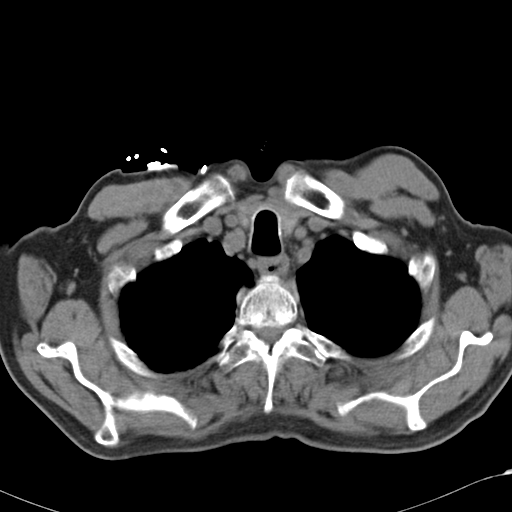
[im 49/56  lung]
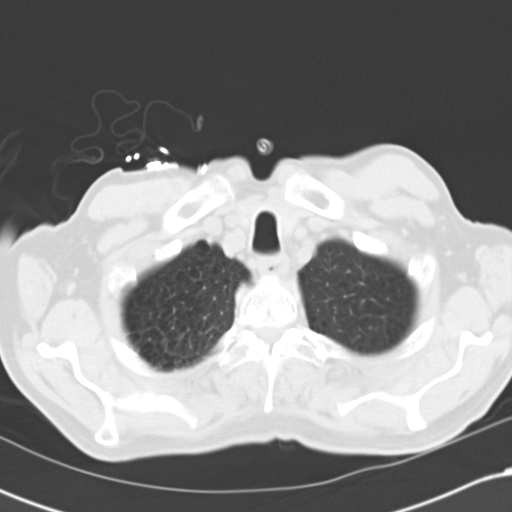
[im 52/56  soft-tissue]
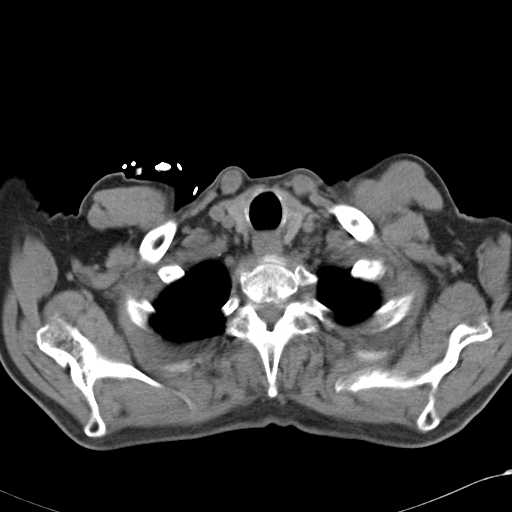
[im 52/56  lung]
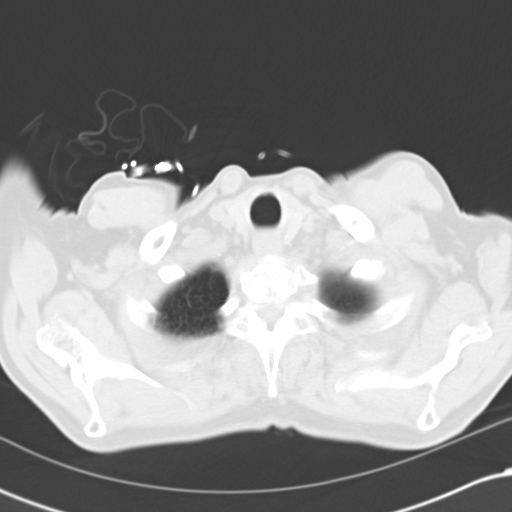

[14 of 32 positions shown; findings below may reference images not displayed]

He has been referred for nodule biopsy.

EXAM:
CT-GUIDED BIOPSY LEFT UPPER LOBE LUNG NODULE BIOPSY

MEDICATIONS AND MEDICAL HISTORY:
Versed 0.5 mg, Fentanyl 50 mcg.

Additional Medications: None.

ANESTHESIA/SEDATION:
Moderate sedation time: 25 minutes

PROCEDURE:
The procedure, risks, benefits, and alternatives were explained to
the patient. Specific risks discussed include bleeding, infection,
pneumothorax, need for further procedure/treatment, air embolus,
cardiopulmonary collapse, death. Questions regarding the procedure
were encouraged and answered. The patient understands and consents
to the procedure.

Scout CT of the chest was performed for planning purposes.

The left upper chest was prepped with chlorhexidine in a sterile
fashion, and a sterile drape was applied covering the operative
field. A sterile gown and sterile gloves were used for the
procedure.

Once the patient is prepped and draped in the usual sterile fashion,
the skin and subcutaneous tissues were generously infiltrated with
1% lidocaine for local anesthesia. A small stab incision was made
with an 11 blade scalpel.

Under CT guidance, a(n) 17 gauge guide needle was advanced into the
left upper lobe nodule. Three separate 18 gauge core biopsy were
then obtained. At this time a BioSentry device was deployed.

Final CT image was acquired.

Patient tolerated the procedure well and remained hemodynamically
stable throughout.

No complications were encountered and no significant blood loss was
encountered.
FINDINGS: CT imaging demonstrates target lesion within the left upper lobe.

Final CT image demonstrates expected pulmonary hemorrhage adjacent
to the lesion, with no evidence of pneumothorax.

COMPLICATIONS:
None
IMPRESSION: Status post left upper lobe nodule biopsy, with tissue specimen sent
to pathology for complete histopathologic analysis.

## 2017-03-28 IMAGING — CR DG CHEST 1V
1 series · 1 of 1 positions shown · non-contrast
Comparison: 04/20/2015 .

CLINICAL DATA: Left chest tube .

EXAM:
CHEST  1 VIEW

[portable]
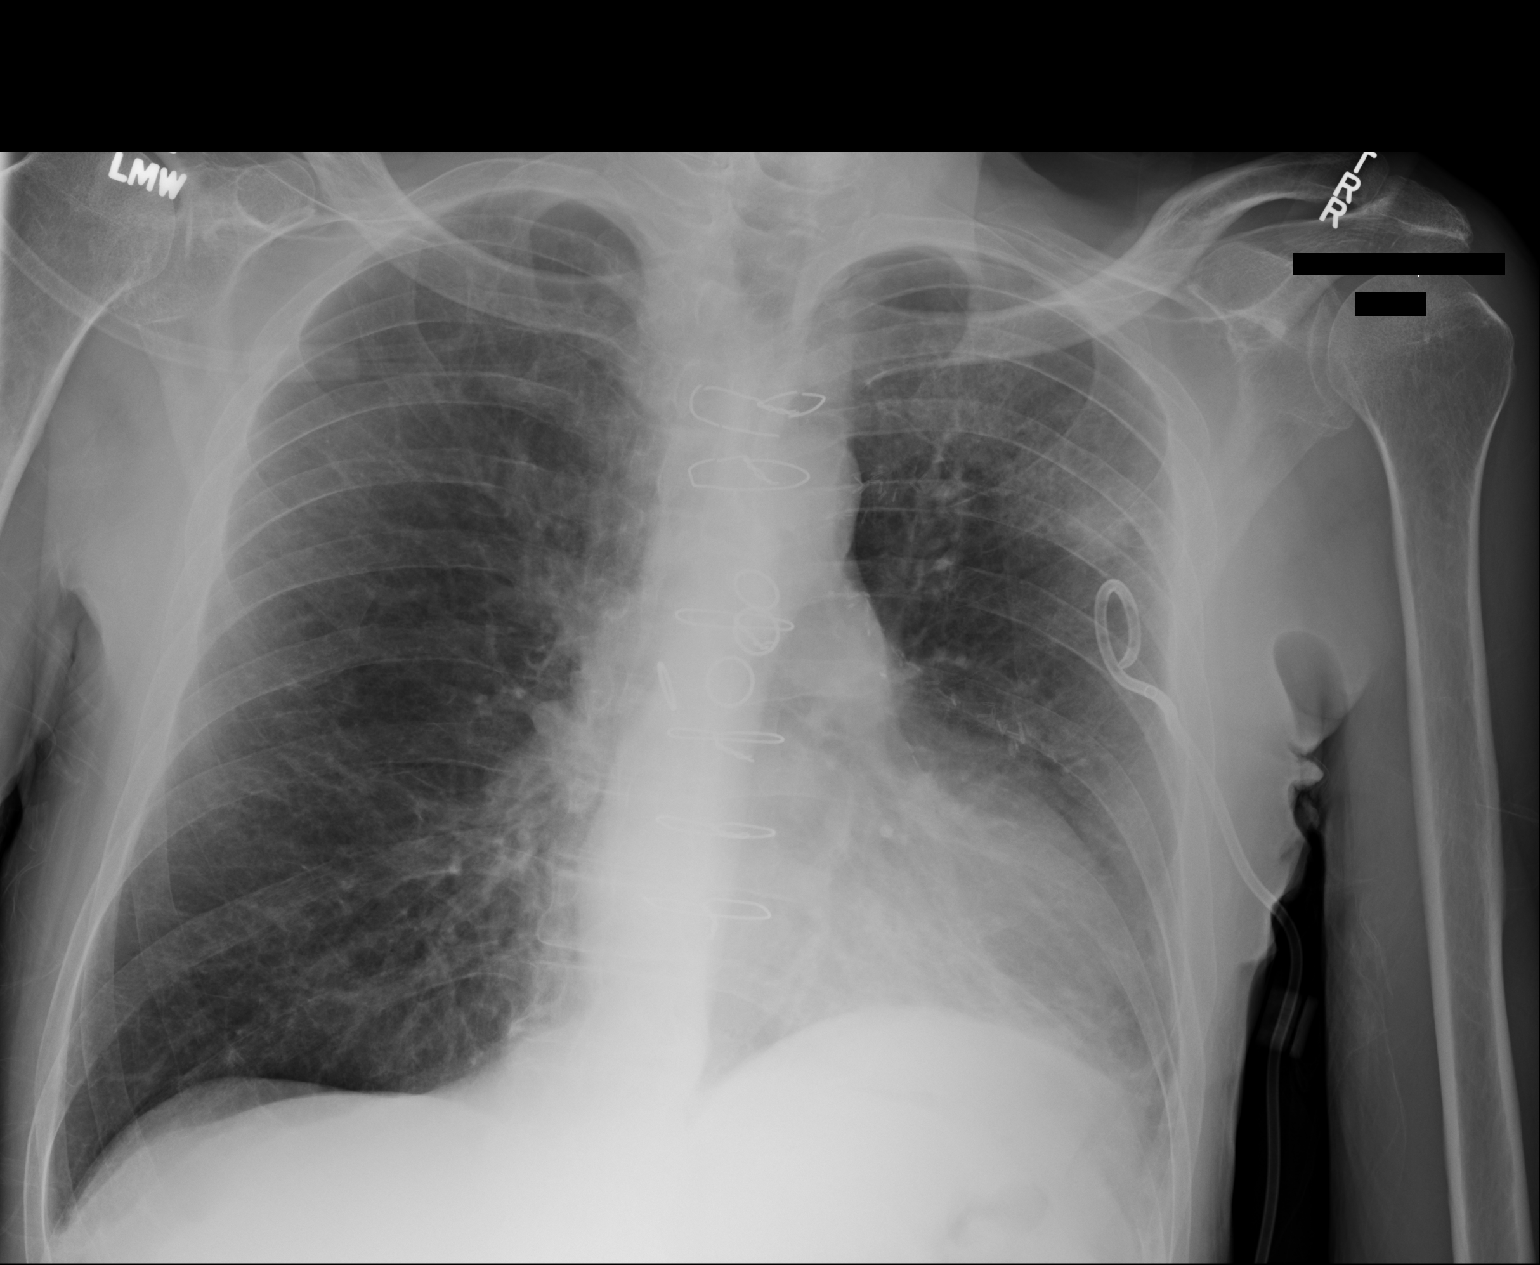

[1 of 1 positions shown; findings below may reference images not displayed]

FINDINGS: Interim placement of left chest tube. No pneumothorax. Mediastinum
and hilar structures are normal. Prior CABG. Cardiomegaly with
normal pulmonary vascularity. Persistent left upper lobe nodule with
partial clearing of adjacent infiltrate. Small left pleural
effusion.
IMPRESSION: 1. Interim placement of left chest tube. Interim resolution of left
pneumothorax.
2. Persistent left upper lobe nodule with partial clearing of
adjacent infiltrate. Small left pleural effusion .
3. Prior CABG.  Stable cardiomegaly.
